# Patient Record
Sex: Female | Born: 1935 | Race: White | Hispanic: No | Smoking: Never smoker
Health system: Southern US, Community
[De-identification: ages and names within clinical notes are randomized; demographics above are authoritative.]

## PROBLEM LIST (undated history)

## (undated) DIAGNOSIS — D47Z9 Other specified neoplasms of uncertain behavior of lymphoid, hematopoietic and related tissue: Secondary | ICD-10-CM

## (undated) DIAGNOSIS — I509 Heart failure, unspecified: Secondary | ICD-10-CM

## (undated) DIAGNOSIS — E785 Hyperlipidemia, unspecified: Secondary | ICD-10-CM

## (undated) DIAGNOSIS — J449 Chronic obstructive pulmonary disease, unspecified: Secondary | ICD-10-CM

## (undated) DIAGNOSIS — I10 Essential (primary) hypertension: Secondary | ICD-10-CM

## (undated) DIAGNOSIS — N3941 Urge incontinence: Secondary | ICD-10-CM

## (undated) HISTORY — PX: REPLACEMENT TOTAL KNEE: SUR1224

## (undated) HISTORY — DX: Heart failure, unspecified: I50.9

## (undated) HISTORY — PX: BREAST CYST EXCISION: SHX579

## (undated) HISTORY — PX: OTHER SURGICAL HISTORY: SHX169

## (undated) HISTORY — DX: Chronic obstructive pulmonary disease, unspecified: J44.9

## (undated) HISTORY — DX: Other specified neoplasms of uncertain behavior of lymphoid, hematopoietic and related tissue: D47.Z9

## (undated) HISTORY — PX: CATARACT EXTRACTION: SUR2

---

## 2003-12-20 ENCOUNTER — Ambulatory Visit: Payer: Self-pay | Admitting: Internal Medicine

## 2004-07-11 ENCOUNTER — Ambulatory Visit: Payer: Self-pay | Admitting: Unknown Physician Specialty

## 2005-06-04 ENCOUNTER — Other Ambulatory Visit: Payer: Self-pay

## 2005-06-16 ENCOUNTER — Inpatient Hospital Stay: Payer: Self-pay | Admitting: General Practice

## 2005-06-20 ENCOUNTER — Encounter: Payer: Self-pay | Admitting: Internal Medicine

## 2005-07-10 ENCOUNTER — Encounter: Payer: Self-pay | Admitting: Internal Medicine

## 2005-09-24 ENCOUNTER — Ambulatory Visit: Payer: Self-pay | Admitting: Unknown Physician Specialty

## 2005-12-28 ENCOUNTER — Other Ambulatory Visit: Payer: Self-pay

## 2005-12-28 ENCOUNTER — Inpatient Hospital Stay: Payer: Self-pay | Admitting: Internal Medicine

## 2006-04-06 ENCOUNTER — Ambulatory Visit: Payer: Self-pay | Admitting: Gastroenterology

## 2006-10-13 ENCOUNTER — Ambulatory Visit: Payer: Self-pay | Admitting: Unknown Physician Specialty

## 2007-04-26 ENCOUNTER — Other Ambulatory Visit: Payer: Self-pay

## 2007-04-26 ENCOUNTER — Emergency Department: Payer: Self-pay | Admitting: Emergency Medicine

## 2007-10-25 ENCOUNTER — Ambulatory Visit: Payer: Self-pay | Admitting: Internal Medicine

## 2008-04-05 ENCOUNTER — Emergency Department: Payer: Self-pay | Admitting: Emergency Medicine

## 2008-12-26 ENCOUNTER — Ambulatory Visit: Payer: Self-pay | Admitting: Internal Medicine

## 2009-01-07 ENCOUNTER — Ambulatory Visit: Payer: Self-pay | Admitting: Internal Medicine

## 2009-01-30 ENCOUNTER — Ambulatory Visit: Payer: Self-pay | Admitting: Surgery

## 2009-02-26 ENCOUNTER — Ambulatory Visit: Payer: Self-pay | Admitting: General Practice

## 2009-03-04 ENCOUNTER — Inpatient Hospital Stay: Payer: Self-pay | Admitting: General Practice

## 2009-03-07 ENCOUNTER — Encounter: Payer: Self-pay | Admitting: Internal Medicine

## 2009-03-12 ENCOUNTER — Encounter: Payer: Self-pay | Admitting: Internal Medicine

## 2009-04-09 ENCOUNTER — Encounter: Payer: Self-pay | Admitting: Internal Medicine

## 2009-05-10 ENCOUNTER — Encounter: Payer: Self-pay | Admitting: Internal Medicine

## 2009-07-25 ENCOUNTER — Emergency Department: Payer: Self-pay | Admitting: Emergency Medicine

## 2010-12-18 ENCOUNTER — Ambulatory Visit: Payer: Self-pay | Admitting: Unknown Physician Specialty

## 2011-09-01 ENCOUNTER — Ambulatory Visit: Payer: Self-pay | Admitting: Obstetrics and Gynecology

## 2011-10-05 ENCOUNTER — Ambulatory Visit: Payer: Self-pay | Admitting: Gastroenterology

## 2011-10-07 LAB — PATHOLOGY REPORT

## 2011-10-09 DIAGNOSIS — N3946 Mixed incontinence: Secondary | ICD-10-CM | POA: Insufficient documentation

## 2012-07-19 ENCOUNTER — Ambulatory Visit: Payer: Self-pay | Admitting: Internal Medicine

## 2013-09-04 ENCOUNTER — Ambulatory Visit: Payer: Self-pay | Admitting: Physician Assistant

## 2013-09-04 DIAGNOSIS — Z6835 Body mass index (BMI) 35.0-35.9, adult: Secondary | ICD-10-CM | POA: Insufficient documentation

## 2013-09-04 DIAGNOSIS — I129 Hypertensive chronic kidney disease with stage 1 through stage 4 chronic kidney disease, or unspecified chronic kidney disease: Secondary | ICD-10-CM | POA: Insufficient documentation

## 2013-12-13 DIAGNOSIS — E7800 Pure hypercholesterolemia, unspecified: Secondary | ICD-10-CM | POA: Insufficient documentation

## 2014-06-19 DIAGNOSIS — Z0000 Encounter for general adult medical examination without abnormal findings: Secondary | ICD-10-CM | POA: Insufficient documentation

## 2015-01-23 ENCOUNTER — Other Ambulatory Visit: Payer: Self-pay | Admitting: Internal Medicine

## 2015-01-23 DIAGNOSIS — R0602 Shortness of breath: Secondary | ICD-10-CM

## 2015-02-01 ENCOUNTER — Ambulatory Visit
Admission: RE | Admit: 2015-02-01 | Discharge: 2015-02-01 | Disposition: A | Discharge status: Home / Self Care | Payer: Medicare Other | Source: Ambulatory Visit | Attending: Internal Medicine | Admitting: Internal Medicine

## 2015-02-01 ENCOUNTER — Ambulatory Visit: Admission: RE | Admit: 2015-02-01 | Payer: Medicare Other | Source: Ambulatory Visit

## 2015-02-01 DIAGNOSIS — R0602 Shortness of breath: Secondary | ICD-10-CM | POA: Insufficient documentation

## 2015-02-01 DIAGNOSIS — K7689 Other specified diseases of liver: Secondary | ICD-10-CM | POA: Diagnosis not present

## 2015-02-01 DIAGNOSIS — R918 Other nonspecific abnormal finding of lung field: Secondary | ICD-10-CM | POA: Diagnosis not present

## 2015-02-01 HISTORY — DX: Essential (primary) hypertension: I10

## 2015-02-01 MED ORDER — IOHEXOL 350 MG/ML SOLN
100.0000 mL | Freq: Once | INTRAVENOUS | Status: AC | PRN
  Administered 2015-02-01: 100 mL via INTRAVENOUS

## 2017-07-07 DIAGNOSIS — D229 Melanocytic nevi, unspecified: Secondary | ICD-10-CM

## 2017-07-07 HISTORY — DX: Melanocytic nevi, unspecified: D22.9

## 2018-04-30 ENCOUNTER — Other Ambulatory Visit: Payer: Self-pay

## 2018-04-30 ENCOUNTER — Encounter: Payer: Self-pay | Admitting: Emergency Medicine

## 2018-04-30 ENCOUNTER — Inpatient Hospital Stay
Admission: EM | Admit: 2018-04-30 | Discharge: 2018-05-06 | DRG: 291 | Disposition: A | Discharge status: Home Health | Payer: Medicare HMO | Attending: Internal Medicine | Admitting: Internal Medicine

## 2018-04-30 ENCOUNTER — Emergency Department: Payer: Medicare HMO

## 2018-04-30 DIAGNOSIS — Z8249 Family history of ischemic heart disease and other diseases of the circulatory system: Secondary | ICD-10-CM | POA: Diagnosis not present

## 2018-04-30 DIAGNOSIS — Z96659 Presence of unspecified artificial knee joint: Secondary | ICD-10-CM | POA: Diagnosis present

## 2018-04-30 DIAGNOSIS — I5033 Acute on chronic diastolic (congestive) heart failure: Secondary | ICD-10-CM | POA: Diagnosis present

## 2018-04-30 DIAGNOSIS — I5021 Acute systolic (congestive) heart failure: Secondary | ICD-10-CM

## 2018-04-30 DIAGNOSIS — Z66 Do not resuscitate: Secondary | ICD-10-CM | POA: Diagnosis present

## 2018-04-30 DIAGNOSIS — I313 Pericardial effusion (noninflammatory): Secondary | ICD-10-CM | POA: Diagnosis present

## 2018-04-30 DIAGNOSIS — Z79899 Other long term (current) drug therapy: Secondary | ICD-10-CM

## 2018-04-30 DIAGNOSIS — R0902 Hypoxemia: Secondary | ICD-10-CM

## 2018-04-30 DIAGNOSIS — I5031 Acute diastolic (congestive) heart failure: Secondary | ICD-10-CM

## 2018-04-30 DIAGNOSIS — I110 Hypertensive heart disease with heart failure: Principal | ICD-10-CM | POA: Diagnosis present

## 2018-04-30 DIAGNOSIS — E785 Hyperlipidemia, unspecified: Secondary | ICD-10-CM | POA: Diagnosis present

## 2018-04-30 DIAGNOSIS — I509 Heart failure, unspecified: Secondary | ICD-10-CM

## 2018-04-30 DIAGNOSIS — T380X5A Adverse effect of glucocorticoids and synthetic analogues, initial encounter: Secondary | ICD-10-CM | POA: Diagnosis present

## 2018-04-30 DIAGNOSIS — N3941 Urge incontinence: Secondary | ICD-10-CM | POA: Diagnosis present

## 2018-04-30 DIAGNOSIS — J209 Acute bronchitis, unspecified: Secondary | ICD-10-CM | POA: Diagnosis present

## 2018-04-30 DIAGNOSIS — J9601 Acute respiratory failure with hypoxia: Secondary | ICD-10-CM | POA: Diagnosis present

## 2018-04-30 DIAGNOSIS — I160 Hypertensive urgency: Secondary | ICD-10-CM | POA: Diagnosis present

## 2018-04-30 DIAGNOSIS — R0602 Shortness of breath: Secondary | ICD-10-CM | POA: Diagnosis present

## 2018-04-30 HISTORY — DX: Hyperlipidemia, unspecified: E78.5

## 2018-04-30 HISTORY — DX: Urge incontinence: N39.41

## 2018-04-30 LAB — CBC WITH DIFFERENTIAL/PLATELET
ABS IMMATURE GRANULOCYTES: 0.07 10*3/uL (ref 0.00–0.07)
Basophils Absolute: 0.1 10*3/uL (ref 0.0–0.1)
Basophils Relative: 0 %
Eosinophils Absolute: 0.1 10*3/uL (ref 0.0–0.5)
Eosinophils Relative: 0 %
HCT: 47.2 % — ABNORMAL HIGH (ref 36.0–46.0)
Hemoglobin: 14.7 g/dL (ref 12.0–15.0)
Immature Granulocytes: 1 %
Lymphocytes Relative: 28 %
Lymphs Abs: 4.2 10*3/uL — ABNORMAL HIGH (ref 0.7–4.0)
MCH: 28.7 pg (ref 26.0–34.0)
MCHC: 31.1 g/dL (ref 30.0–36.0)
MCV: 92.2 fL (ref 80.0–100.0)
Monocytes Absolute: 1 10*3/uL (ref 0.1–1.0)
Monocytes Relative: 7 %
Neutro Abs: 9.5 10*3/uL — ABNORMAL HIGH (ref 1.7–7.7)
Neutrophils Relative %: 64 %
Platelets: 170 10*3/uL (ref 150–400)
RBC: 5.12 MIL/uL — ABNORMAL HIGH (ref 3.87–5.11)
RDW: 13.8 % (ref 11.5–15.5)
Smear Review: ADEQUATE
WBC: 14.9 10*3/uL — ABNORMAL HIGH (ref 4.0–10.5)
nRBC: 0 % (ref 0.0–0.2)

## 2018-04-30 LAB — COMPREHENSIVE METABOLIC PANEL
ALBUMIN: 3.6 g/dL (ref 3.5–5.0)
ALT: 24 U/L (ref 0–44)
AST: 38 U/L (ref 15–41)
Alkaline Phosphatase: 46 U/L (ref 38–126)
Anion gap: 8 (ref 5–15)
BUN: 17 mg/dL (ref 8–23)
CO2: 25 mmol/L (ref 22–32)
Calcium: 8.8 mg/dL — ABNORMAL LOW (ref 8.9–10.3)
Chloride: 103 mmol/L (ref 98–111)
Creatinine, Ser: 0.76 mg/dL (ref 0.44–1.00)
GFR calc Af Amer: >60 mL/min (ref >60)
GFR calc non Af Amer: >60 mL/min (ref >60)
Glucose, Bld: 172 mg/dL — ABNORMAL HIGH (ref 70–99)
Potassium: 4.8 mmol/L (ref 3.5–5.1)
Sodium: 136 mmol/L (ref 135–145)
Total Bilirubin: 0.9 mg/dL (ref 0.3–1.2)
Total Protein: 8 g/dL (ref 6.5–8.1)

## 2018-04-30 LAB — BRAIN NATRIURETIC PEPTIDE: B Natriuretic Peptide: 635 pg/mL — ABNORMAL HIGH (ref 0.0–100.0)

## 2018-04-30 LAB — INFLUENZA PANEL BY PCR (TYPE A & B)
INFLBPCR: NEGATIVE
Influenza A By PCR: NEGATIVE

## 2018-04-30 LAB — TSH: TSH: 1.297 u[IU]/mL (ref 0.350–4.500)

## 2018-04-30 LAB — LACTIC ACID, PLASMA: Lactic Acid, Venous: 1.1 mmol/L (ref 0.5–1.9)

## 2018-04-30 LAB — TROPONIN I: Troponin I: 0.03 ng/mL (ref <0.03)

## 2018-04-30 MED ORDER — GUAIFENESIN-DM 100-10 MG/5ML PO SYRP
5.0000 mL | ORAL_SOLUTION | ORAL | Status: DC | PRN
  Administered 2018-05-01 – 2018-05-05 (×9): 5 mL via ORAL
  Filled 2018-04-30 (×10): qty 5

## 2018-04-30 MED ORDER — IPRATROPIUM-ALBUTEROL 0.5-2.5 (3) MG/3ML IN SOLN
3.0000 mL | Freq: Once | RESPIRATORY_TRACT | Status: AC
  Administered 2018-04-30: 3 mL via RESPIRATORY_TRACT
  Filled 2018-04-30: qty 3

## 2018-04-30 MED ORDER — SODIUM CHLORIDE 0.9% FLUSH: 3.0000 mL | INTRAVENOUS | Status: DC | PRN

## 2018-04-30 MED ORDER — IPRATROPIUM-ALBUTEROL 0.5-2.5 (3) MG/3ML IN SOLN
3.0000 mL | Freq: Four times a day (QID) | RESPIRATORY_TRACT | Status: DC
  Administered 2018-04-30: 3 mL via RESPIRATORY_TRACT
  Filled 2018-04-30: qty 3

## 2018-04-30 MED ORDER — ISOSORBIDE MONONITRATE ER 30 MG PO TB24
30.0000 mg | ORAL_TABLET | Freq: Every day | ORAL | Status: DC
  Administered 2018-04-30 – 2018-05-06 (×7): 30 mg via ORAL
  Filled 2018-04-30 (×7): qty 1

## 2018-04-30 MED ORDER — CARVEDILOL 25 MG PO TABS
25.0000 mg | ORAL_TABLET | Freq: Two times a day (BID) | ORAL | Status: DC
  Administered 2018-04-30 – 2018-05-01 (×2): 25 mg via ORAL
  Filled 2018-04-30 (×2): qty 1

## 2018-04-30 MED ORDER — ENOXAPARIN SODIUM 40 MG/0.4ML ~~LOC~~ SOLN
40.0000 mg | SUBCUTANEOUS | Status: DC
  Administered 2018-04-30 – 2018-05-05 (×6): 40 mg via SUBCUTANEOUS
  Filled 2018-04-30 (×6): qty 0.4

## 2018-04-30 MED ORDER — ACETAMINOPHEN 325 MG PO TABS
650.0000 mg | ORAL_TABLET | ORAL | Status: DC | PRN
  Administered 2018-04-30: 650 mg via ORAL
  Filled 2018-04-30: qty 2

## 2018-04-30 MED ORDER — LISINOPRIL 5 MG PO TABS
5.0000 mg | ORAL_TABLET | Freq: Every day | ORAL | Status: DC
  Administered 2018-04-30 – 2018-05-01 (×2): 5 mg via ORAL
  Filled 2018-04-30 (×2): qty 1

## 2018-04-30 MED ORDER — NITROGLYCERIN 2 % TD OINT
1.0000 [in_us] | TOPICAL_OINTMENT | Freq: Once | TRANSDERMAL | Status: AC
  Administered 2018-04-30: 1 [in_us] via TOPICAL
  Filled 2018-04-30: qty 1

## 2018-04-30 MED ORDER — SODIUM CHLORIDE 0.9 % IV SOLN: 250.0000 mL | INTRAVENOUS | Status: DC | PRN

## 2018-04-30 MED ORDER — SODIUM CHLORIDE 0.9% FLUSH
3.0000 mL | Freq: Two times a day (BID) | INTRAVENOUS | Status: DC
  Administered 2018-04-30 – 2018-05-06 (×11): 3 mL via INTRAVENOUS

## 2018-04-30 MED ORDER — IPRATROPIUM-ALBUTEROL 0.5-2.5 (3) MG/3ML IN SOLN: 3.0000 mL | RESPIRATORY_TRACT | Status: DC

## 2018-04-30 MED ORDER — METHYLPREDNISOLONE SODIUM SUCC 125 MG IJ SOLR
60.0000 mg | Freq: Two times a day (BID) | INTRAMUSCULAR | Status: DC
  Administered 2018-05-01 – 2018-05-02 (×3): 60 mg via INTRAVENOUS
  Filled 2018-04-30 (×3): qty 2

## 2018-04-30 MED ORDER — ASPIRIN EC 81 MG PO TBEC
81.0000 mg | DELAYED_RELEASE_TABLET | Freq: Every day | ORAL | Status: DC
  Filled 2018-04-30 (×7): qty 1

## 2018-04-30 MED ORDER — METHYLPREDNISOLONE SODIUM SUCC 125 MG IJ SOLR: 60.0000 mg | Freq: Four times a day (QID) | INTRAMUSCULAR | Status: DC

## 2018-04-30 MED ORDER — FUROSEMIDE 10 MG/ML IJ SOLN
40.0000 mg | Freq: Once | INTRAMUSCULAR | Status: AC
  Administered 2018-04-30: 40 mg via INTRAVENOUS
  Filled 2018-04-30: qty 4

## 2018-04-30 MED ORDER — ONDANSETRON HCL 4 MG/2ML IJ SOLN: 4.0000 mg | Freq: Four times a day (QID) | INTRAMUSCULAR | Status: DC | PRN

## 2018-04-30 MED ORDER — FUROSEMIDE 10 MG/ML IJ SOLN
40.0000 mg | Freq: Two times a day (BID) | INTRAMUSCULAR | Status: DC
  Administered 2018-04-30 – 2018-05-06 (×12): 40 mg via INTRAVENOUS
  Filled 2018-04-30 (×12): qty 4

## 2018-04-30 MED ORDER — IPRATROPIUM-ALBUTEROL 0.5-2.5 (3) MG/3ML IN SOLN
3.0000 mL | RESPIRATORY_TRACT | Status: DC | PRN
  Administered 2018-05-03: 3 mL via RESPIRATORY_TRACT
  Filled 2018-04-30: qty 3

## 2018-04-30 NOTE — ED Notes (Signed)
Nitro patch removed

## 2018-04-30 NOTE — Plan of Care (Signed)

## 2018-04-30 NOTE — Progress Notes (Signed)
Patient given "Living Better with Heart Failure" packet. Educated on overall packet. Went over importance of daily weights, diuretics, and limit amount of salt intake. Patient will need re-enforcement since this is new diagnosis for him. Patient will need to watch EMMI videos. Will continue to monitor and relay this information to oncoming nursing staff.

## 2018-04-30 NOTE — Progress Notes (Signed)
Advanced care plan.  Purpose of the Encounter: CODE STATUS  Parties in Attendance: Patient herself  Patient's Decision Capacity: Intact  Subjective/Patient's story: Lori Browning  is a 85 y.o. female with a known history of hyperlipidemia, hypertension and urge incontinence who is presenting to the hospital with shortness of breath and cough.  Patient states that she was recently seen by her primary care provider and was treated for bronchitis with steroids and antibiotics.  Patient became very short of breath and hypoxic with oxygen in the 70s.  Initially had to require BiPAP now off BiPAP   Objective/Medical story I discussed with the patient regarding her desires for cardiac and pulmonary resuscitation.  Explained her what this entails.  Also discussed with the patient regarding healthcare power of attorney and living will.   Goals of care determination:  Patient wants to be a DNR, recommended discussing healthcare power of attorney as well as living will with her family   CODE STATUS: DNR   Time spent discussing advanced care planning: 16 minutes

## 2018-04-30 NOTE — ED Notes (Signed)
Called report - no report needed, pt can go up

## 2018-04-30 NOTE — ED Provider Notes (Signed)
[facility] Emergency Department Provider Note       Time seen: ----------------------------------------- 11:59 AM on 04/30/2018 -----------------------------------------   I have reviewed the triage vital signs and the nursing notes.  HISTORY   Chief Complaint Shortness of Breath and Cough   HPI Lori Browning is a 85 y.o. female with a history of hypertension and recent bronchitis who presents to the ED for dyspnea and hypoxia.  According to EMS she was hypoxic in route into the 70s.  Recently diagnosed with bronchitis and possible pneumonia.  She was placed on Levaquin and steroids.  Patient states acutely last night she became worse, she called out for shortness of breath.  Has not had any recent travel or exposure that she is aware of.  Past Medical History:  Diagnosis Date  . Hypertension     There are no active problems to display for this patient.   Allergies Sulfa antibiotics  Social History Social History   Tobacco Use  . Smoking status: Not on file  Substance Use Topics  . Alcohol use: Not on file  . Drug use: Not on file   Review of Systems Constitutional: Negative for fever. Cardiovascular: Negative for chest pain. Respiratory: Positive for shortness of breath Gastrointestinal: Negative for abdominal pain, vomiting and diarrhea. Musculoskeletal: Negative for back pain. Skin: Negative for rash. Neurological: Negative for headaches, focal weakness or numbness.  All systems negative/normal/unremarkable except as stated in the HPI  ____________________________________________   PHYSICAL EXAM:  VITAL SIGNS: ED Triage Vitals  Enc Vitals Group     BP      Pulse      Resp      Temp      Temp src      SpO2      Weight      Height      Head Circumference      Peak Flow      Pain Score      Pain Loc      Pain Edu?      Excl. in [place]?    Constitutional: Alert and oriented.  Mild distress Eyes: Conjunctivae are normal.  Normal extraocular movements. ENT      Head: Normocephalic and atraumatic.      Nose: No congestion/rhinnorhea.      Mouth/Throat: Mucous membranes are moist.      Neck: No stridor. Cardiovascular: Normal rate, regular rhythm. No murmurs, rubs, or gallops. Respiratory: Tachypnea with wheezing and fine rales bilaterally Gastrointestinal: Soft and nontender. Normal bowel sounds Musculoskeletal: Nontender with normal range of motion in extremities.  Mild edema is noted Neurologic:  Normal speech and language. No gross focal neurologic deficits are appreciated.  Skin:  Skin is warm, dry and intact. No rash noted. Psychiatric: Mood and affect are normal. Speech and behavior are normal.  ____________________________________________  EKG: Interpreted by me.  Sinus rhythm with rate 87 bpm, normal PR interval, possible septal infarct, normal axis  ____________________________________________  ED COURSE:  As part of my medical decision making, I reviewed the following data within the [place] History obtained from family if available, nursing notes, old chart and ekg, as well as notes from prior ED visits. Patient presented for shortness of breath, we will assess with labs and imaging as indicated at this time. Clinical Course as of Apr 30 1330  Sat Apr 30, 2018  1227 Patient is much more comfortable on BiPAP at this time.   [JW]  Clinical Course User Index [JW] Earleen Newport, MD   Procedures ____________________________________________   LABS (pertinent positives/negatives)  Labs Reviewed  CBC WITH DIFFERENTIAL/PLATELET - Abnormal; Notable for the following components:      Result Value   WBC 14.9 (*)    RBC 5.12 (*)    HCT 47.2 (*)    Neutro Abs 9.5 (*)    Lymphs Abs 4.2 (*)    All other components within normal limits  BRAIN NATRIURETIC PEPTIDE - Abnormal; Notable for the following components:   B Natriuretic Peptide 635.0 (*)    All other  components within normal limits  COMPREHENSIVE METABOLIC PANEL - Abnormal; Notable for the following components:   Glucose, Bld 172 (*)    Calcium 8.8 (*)    All other components within normal limits  CULTURE, BLOOD (ROUTINE X 2)  CULTURE, BLOOD (ROUTINE X 2)  LACTIC ACID, PLASMA  INFLUENZA PANEL BY PCR (TYPE A & B)   CRITICAL CARE Performed by: Laurence Aly   Total critical care time: 30 minutes  Critical care time was exclusive of separately billable procedures and treating other patients.  Critical care was necessary to treat or prevent imminent or life-threatening deterioration.  Critical care was time spent personally by me on the following activities: development of treatment plan with patient and/or surrogate as well as nursing, discussions with consultants, evaluation of patient's response to treatment, examination of patient, obtaining history from patient or surrogate, ordering and performing treatments and interventions, ordering and review of laboratory studies, ordering and review of radiographic studies, pulse oximetry and re-evaluation of patient's condition.  RADIOLOGY Images were viewed by me  Chest x-ray IMPRESSION: Cardiomegaly and pulmonary vascular congestion. ____________________________________________   DIFFERENTIAL DIAGNOSIS   CHF, COPD, pneumonia, influenza, pneumothorax, PE  FINAL ASSESSMENT AND PLAN  Dyspnea, hypoxia, acute systolic congestive heart failure   Plan: The patient had presented for shortness of breath and hypoxia with recent diagnosis of bronchitis. Patient's labs did reveal some leukocytosis likely secondary to recent prednisone.  BNP is elevated. Patient's imaging was consistent with cardiomegaly and pulmonary vascular congestion.  This appears to be acute systolic congestive heart failure.  She was given nitroglycerin, placed on BiPAP and given Lasix.  She appears much improved.  She is much more comfortable and breathing more  easily.  I will discuss with the hospitalist for admission.  I am hopeful she can come off BiPAP soon.   Laurence Aly, MD    Note: This note was generated in part or whole with voice recognition software. Voice recognition is usually quite accurate but there are transcription errors that can and very often do occur. I apologize for any typographical errors that were not detected and corrected.     Earleen Newport, MD 04/30/18 [phone]

## 2018-04-30 NOTE — ED Triage Notes (Signed)
Pt was dx with bronchitis/ possible pneumonia on Thursday. Placed on levaquin and prednisone. Called out for sob

## 2018-04-30 NOTE — Consult Note (Signed)
Lori Browning is a 85 y.o. female  [id number]  Primary Cardiologist: Neoma Laming Reason for Consultation: Shortness of breath/CHF  HPI: This 85 year old white female patient of Dr. Ouida Sills presented to the hospital hospital after having worsening shortness of breath orthopnea PND and cough.  Patient states that she called Dr. Ouida Sills this Wednesday and was very short of breath and fatigue and sore throat and was recommended to go to another clinic where she had a flu test done which was negative and was treated for possible bronchitis.  Patient continued to get worse thus got admitted to the hospital with shortness of breath cough malaise.   Review of Systems: no chest pain or fever   Past Medical History:  Diagnosis Date  . Hyperlipemia   . Hypertension   . Urge incontinence     Medications Prior to Admission  Medication Sig Dispense Refill  . carvedilol (COREG) 25 MG tablet TAKE ONE TABLET BY MOUTH TWICE DAILY WITH MEALS.    [name] levofloxacin (LEVAQUIN) 500 MG tablet Take 500 mg by mouth daily.    . pravastatin (PRAVACHOL) 40 MG tablet Take 40 mg by mouth daily.    . predniSONE (DELTASONE) 10 MG tablet 6 pills x 1 day, 5 pills x 1 day, 4 pills x 1 day, 3 pills x 1 day, 2 pills x 1 day. 1 pill x 1 day.       [name] aspirin EC  81 mg Oral Daily  . carvedilol  25 mg Oral BID WC  . enoxaparin (LOVENOX) injection  40 mg Subcutaneous Q24H  . furosemide  40 mg Intravenous BID  . ipratropium-albuterol  3 mL Nebulization Q6H  . isosorbide mononitrate  30 mg Oral Daily  . lisinopril  5 mg Oral Daily  . [START ON 05/01/2018] methylPREDNISolone (SOLU-MEDROL) injection  60 mg Intravenous Q12H  . sodium chloride flush  3 mL Intravenous Q12H    Infusions: . sodium chloride      Allergies  Allergen Reactions  . Sulfa Antibiotics Nausea And Vomiting and Rash    Social History   Socioeconomic History  . Marital status: Married    Spouse name: Not on file  . Number of children: Not  on file  . Years of education: Not on file  . Highest education level: Not on file  Occupational History  . Not on file  Social Needs  . Financial resource strain: Not on file  . Food insecurity:    Worry: Not on file    Inability: Not on file  . Transportation needs:    Medical: Not on file    Non-medical: Not on file  Tobacco Use  . Smoking status: Never Smoker  . Smokeless tobacco: Never Used  Substance and Sexual Activity  . Alcohol use: Never    Frequency: Never  . Drug use: Never  . Sexual activity: Not on file  Lifestyle  . Physical activity:    Days per week: Not on file    Minutes per session: Not on file  . Stress: Not on file  Relationships  . Social connections:    Talks on phone: Not on file    Gets together: Not on file    Attends religious service: Not on file    Active member of club or organization: Not on file    Attends meetings of clubs or organizations: Not on file    Relationship status: Not on file  . Intimate partner violence:    Fear  of current or ex partner: Not on file    Emotionally abused: Not on file    Physically abused: Not on file    Forced sexual activity: Not on file  Other Topics Concern  . Not on file  Social History Narrative  . Not on file    Family History  Problem Relation Age of Onset  . Hypertension Mother     PHYSICAL EXAM: Vitals:   04/30/18 1600 04/30/18 1922  BP: (!) 177/81 114/62  Pulse: 69 70  Resp: 19 18  Temp: 97.7 F (36.5 C) 98.5 F (36.9 C)  SpO2: 97% 95%    No intake or output data in the 24 hours ending 04/30/18 1927  General:  Well appearing. No respiratory difficulty HEENT: normal Neck: supple. no JVD. Carotids 2+ bilat; no bruits. No lymphadenopathy or thryomegaly appreciated. Cor: PMI nondisplaced. Regular rate & rhythm. No rubs, gallops or murmurs. Lungs: clear Abdomen: soft, nontender, nondistended. No hepatosplenomegaly. No bruits or masses. Good bowel sounds. Extremities: no cyanosis,  clubbing, rash, edema Neuro: alert & oriented x 3, cranial nerves grossly intact. moves all 4 extremities w/o difficulty. Affect pleasant.  ECG: NSR non specific st change  Results for orders placed or performed during the hospital encounter of 04/30/18 (from the past 24 hour(s))  CBC with Differential     Status: Abnormal   Collection Time: 04/30/18 12:10 PM  Result Value Ref Range   WBC 14.9 (H) 4.0 - 10.5 K/uL   RBC 5.12 (H) 3.87 - 5.11 MIL/uL   Hemoglobin 14.7 12.0 - 15.0 g/dL   HCT 47.2 (H) 36.0 - 46.0 %   MCV 92.2 80.0 - 100.0 fL   MCH 28.7 26.0 - 34.0 pg   MCHC 31.1 30.0 - 36.0 g/dL   RDW 13.8 11.5 - 15.5 %   Platelets 170 150 - 400 K/uL   nRBC 0.0 0.0 - 0.2 %   Neutrophils Relative % 64 %   Neutro Abs 9.5 (H) 1.7 - 7.7 K/uL   Lymphocytes Relative 28 %   Lymphs Abs 4.2 (H) 0.7 - 4.0 K/uL   Monocytes Relative 7 %   Monocytes Absolute 1.0 0.1 - 1.0 K/uL   Eosinophils Relative 0 %   Eosinophils Absolute 0.1 0.0 - 0.5 K/uL   Basophils Relative 0 %   Basophils Absolute 0.1 0.0 - 0.1 K/uL   RBC Morphology MORPHOLOGY UNREMARKABLE    Smear Review PLATELETS APPEAR ADEQUATE    Immature Granulocytes 1 %   Abs Immature Granulocytes 0.07 0.00 - 0.07 K/uL   Reactive, Benign Lymphocytes PRESENT   Brain natriuretic peptide     Status: Abnormal   Collection Time: 04/30/18 12:10 PM  Result Value Ref Range   B Natriuretic Peptide 635.0 (H) 0.0 - 100.0 pg/mL  Comprehensive metabolic panel     Status: Abnormal   Collection Time: 04/30/18 12:10 PM  Result Value Ref Range   Sodium 136 135 - 145 mmol/L   Potassium 4.8 3.5 - 5.1 mmol/L   Chloride 103 98 - 111 mmol/L   CO2 25 22 - 32 mmol/L   Glucose, Bld 172 (H) 70 - 99 mg/dL   BUN 17 8 - 23 mg/dL   Creatinine, Ser 0.76 0.44 - 1.00 mg/dL   Calcium 8.8 (L) 8.9 - 10.3 mg/dL   Total Protein 8.0 6.5 - 8.1 g/dL   Albumin 3.6 3.5 - 5.0 g/dL   AST 38 15 - 41 U/L   ALT 24 0 - 44 U/L  Alkaline Phosphatase 46 38 - 126 U/L   Total Bilirubin  0.9 0.3 - 1.2 mg/dL   GFR calc non Af Amer >60 >60 mL/min   GFR calc Af Amer >60 >60 mL/min   Anion gap 8 5 - 15  Lactic acid, plasma     Status: None   Collection Time: 04/30/18 12:10 PM  Result Value Ref Range   Lactic Acid, Venous 1.1 0.5 - 1.9 mmol/L  Troponin I - Add-On to previous collection     Status: Abnormal   Collection Time: 04/30/18 12:10 PM  Result Value Ref Range   Troponin I 0.03 (HH) <0.03 ng/mL  Influenza panel by PCR (type A & B)     Status: None   Collection Time: 04/30/18 12:11 PM  Result Value Ref Range   Influenza A By PCR NEGATIVE NEGATIVE   Influenza B By PCR NEGATIVE NEGATIVE  TSH     Status: None   Collection Time: 04/30/18  4:19 PM  Result Value Ref Range   TSH 1.297 0.350 - 4.500 uIU/mL   Dg Chest Port 1 View  Result Date: 04/30/2018 CLINICAL DATA:  Hypoxia and dyspnea today. The patient was diagnosed with bronchitis/pneumonia 04/28/2018. EXAM: PORTABLE CHEST 1 VIEW COMPARISON:  CT chest 02/01/2015. FINDINGS: There is cardiomegaly and vascular congestion. No consolidative process, pneumothorax or pleural effusion. No acute or focal bony abnormality. IMPRESSION: Cardiomegaly and pulmonary vascular congestion. Electronically Signed   By: Inge Rise M.D.   On: 04/30/2018 12:27     ASSESSMENT AND PLAN: CHF, with h/o echo in 1/20 showing LVEF 60% with grade 1 diastolic dysfunction, mild MR/TR, and mild luminor irregularities with ca score 99.6, minor luminor irregularitis. Agree with IV lasix/ace-inhibitors. Consider Covid-19 testiing as normally does not get fever.  , A

## 2018-04-30 NOTE — H&P (Signed)
[place] [facility] [place] NAME: Lori Browning    MR#:  [id number]  DATE OF BIRTH:  02/09/1931  DATE OF ADMISSION:  04/30/2018  PRIMARY CARE PHYSICIAN: Kirk Ruths, MD   REQUESTING/REFERRING PHYSICIAN: Earleen Newport, MD  CHIEF COMPLAINT:   Chief Complaint  Patient presents with  . Shortness of Breath  . Cough    HISTORY OF PRESENT ILLNESS: Lori Browning  is a 85 y.o. female with a known history of hyperlipidemia, hypertension and urge incontinence who is presenting to the hospital with shortness of breath and cough.  Patient states that she was recently seen by her primary care provider and was treated for bronchitis with steroids and antibiotics.  Patient became very short of breath and hypoxic with oxygen in the 70s.  Initially had to require BiPAP now off BiPAP.  Patient states that she lives by herself and has not traveled outside of the city.  He has not had any visitors.  He denies any fevers or chills.  His complaint of cough.  States that she was seen by cardiology recently and underwent a stress test, echo, and and likely cardiac CT.      PAST MEDICAL HISTORY:   Past Medical History:  Diagnosis Date  . Hyperlipemia   . Hypertension   . Urge incontinence     PAST SURGICAL HISTORY:  Past Surgical History:  Procedure Laterality Date  . BREAST CYST EXCISION    . CATARACT EXTRACTION    . hip replacment    . REPLACEMENT TOTAL KNEE      SOCIAL HISTORY:  Social History   Tobacco Use  . Smoking status: Never Smoker  . Smokeless tobacco: Never Used  Substance Use Topics  . Alcohol use: Never    Frequency: Never    FAMILY HISTORY:  Family History  Problem Relation Age of Onset  . Hypertension Mother     DRUG ALLERGIES:  Allergies  Allergen Reactions  . Sulfa Antibiotics Nausea And Vomiting and Rash    REVIEW OF SYSTEMS:   CONSTITUTIONAL: No fever, fatigue or weakness.  EYES: No blurred or double vision.   EARS, NOSE, AND THROAT: No tinnitus or ear pain.  RESPIRATORY: No cough, positive shortness of breath, positive wheezing or hemoptysis.  CARDIOVASCULAR: No chest pain, orthopnea, edema.  GASTROINTESTINAL: No nausea, vomiting, diarrhea or abdominal pain.  GENITOURINARY: No dysuria, hematuria.  ENDOCRINE: No polyuria, nocturia,  HEMATOLOGY: No anemia, easy bruising or bleeding SKIN: No rash or lesion. MUSCULOSKELETAL: No joint pain or arthritis.   NEUROLOGIC: No tingling, numbness, weakness.  PSYCHIATRY: No anxiety or depression.   MEDICATIONS AT HOME:  Prior to Admission medications   Not on File      PHYSICAL EXAMINATION:   VITAL SIGNS: Blood pressure 133/71, pulse 69, temperature 97.8 F (36.6 C), resp. rate (!) 27, height 5\' 6"  (1.676 m), weight 109.4 kg, SpO2 95 %.  GENERAL:  85 y.o.-year-old patient lying in the bed with no acute distress.  EYES: Pupils equal, round, reactive to light and accommodation. No scleral icterus. Extraocular muscles intact.  HEENT: Head atraumatic, normocephalic. Oropharynx and nasopharynx clear.  NECK:  Supple, no jugular venous distention. No thyroid enlargement, no tenderness.  LUNGS: Rhonchus breath sounds bilaterally without any accessory muscle usage, crackles at the base CARDIOVASCULAR: S1, S2 normal. No murmurs, rubs, or gallops.  ABDOMEN: Soft, nontender, nondistended. Bowel sounds present. No organomegaly or mass.  EXTREMITIES: No pedal edema, cyanosis, or clubbing.  NEUROLOGIC:  Cranial nerves II through XII are intact. Muscle strength 5/5 in all extremities. Sensation intact. Gait not checked.  PSYCHIATRIC: The patient is alert and oriented x 3.  SKIN: No obvious rash, lesion, or ulcer.   LABORATORY PANEL:   CBC Recent Labs  Lab 04/30/18 1210  WBC 14.9*  HGB 14.7  HCT 47.2*  PLT 170  MCV 92.2  MCH 28.7  MCHC 31.1  RDW 13.8  LYMPHSABS 4.2*  MONOABS 1.0  EOSABS 0.1  BASOSABS 0.1    ------------------------------------------------------------------------------------------------------------------  Chemistries  Recent Labs  Lab 04/30/18 1210  NA 136  K 4.8  CL 103  CO2 25  GLUCOSE 172*  BUN 17  CREATININE 0.76  CALCIUM 8.8*  AST 38  ALT 24  ALKPHOS 46  BILITOT 0.9   ------------------------------------------------------------------------------------------------------------------ estimated creatinine clearance is 64.4 mL/min (by C-G formula based on SCr of 0.76 mg/dL). ------------------------------------------------------------------------------------------------------------------ No results for input(s): TSH, T4TOTAL, T3FREE, THYROIDAB in the last 72 hours.  Invalid input(s): FREET3   Coagulation profile No results for input(s): INR, PROTIME in the last 168 hours. ------------------------------------------------------------------------------------------------------------------- No results for input(s): DDIMER in the last 72 hours. -------------------------------------------------------------------------------------------------------------------  Cardiac Enzymes Recent Labs  Lab 04/30/18 1210  TROPONINI 0.03*   ------------------------------------------------------------------------------------------------------------------ Invalid input(s): POCBNP  ---------------------------------------------------------------------------------------------------------------  Urinalysis No results found for: COLORURINE, APPEARANCEUR, LABSPEC, PHURINE, GLUCOSEU, HGBUR, BILIRUBINUR, KETONESUR, PROTEINUR, UROBILINOGEN, NITRITE, LEUKOCYTESUR   RADIOLOGY: Dg Chest Port 1 View  Result Date: 04/30/2018 CLINICAL DATA:  Hypoxia and dyspnea today. The patient was diagnosed with bronchitis/pneumonia 04/28/2018. EXAM: PORTABLE CHEST 1 VIEW COMPARISON:  CT chest 02/01/2015. FINDINGS: There is cardiomegaly and vascular congestion. No consolidative process, pneumothorax or  pleural effusion. No acute or focal bony abnormality. IMPRESSION: Cardiomegaly and pulmonary vascular congestion. Electronically Signed   By: Inge Rise M.D.   On: 04/30/2018 12:27    EKG: Orders placed or performed during the hospital encounter of 04/30/18  . EKG 12-Lead  . EKG 12-Lead  . ED EKG  . ED EKG    IMPRESSION AND PLAN: Patient is a 85 year old white female with history of hypertension presenting with complaint of shortness of breath  1.  Acute congestive heart failure likely diastolic in nature Treat with IV Lasix I will not repeat echo since it was recently done. Cardiology consult.  2.  Cough with bronchospasm we will treat with steroids and nebulizer therapy  3.  Accelerated hypertension we will continue her Coreg start on ACE inhibitor I will do IV hydralazine.  4.  Hyperlipidemia resume home medications once medication list updated  5.  Miscellaneous Lovenox for DVT prophylaxis     All the records are reviewed and case discussed with ED provider. Management plans discussed with the patient, family and they are in agreement.  CODE STATUS: Advance Directive Documentation     Most Recent Value  Type of Advance Directive  Healthcare Power of Attorney, Living will  Pre-existing out of facility DNR order (yellow form or pink MOST form)  -  "MOST" Form in Place?  -       TOTAL TIME TAKING CARE OF THIS PATIENT:22minutes.    Dustin Flock M.D on 04/30/2018 at 2:39 PM  Between 7am to 6pm - Pager - [phone]  After 6pm go to [email or web address] - password [email or web address]  [employer] [facility] Office  [phone]  CC: Primary care physician; Kirk Ruths, MD

## 2018-04-30 NOTE — ED Notes (Signed)
ED TO INPATIENT HANDOFF REPORT  ED Nurse Name and Phone #: Kimi Kroft [phone]  S Name/Age/Gender Lori Browning 85 y.o. female Room/Bed: ED18A/ED18A  Code Status   Code Status: DNR  Home/SNF/Other Home Patient oriented to: self Is this baseline? Yes   Triage Complete: Triage complete  Chief Complaint bronchitis  Triage Note Pt was dx with bronchitis/ possible pneumonia on Thursday. Placed on levaquin and prednisone. Called out for sob   Allergies Allergies  Allergen Reactions  . Sulfa Antibiotics Nausea And Vomiting and Rash    Level of Care/Admitting Diagnosis ED Disposition    ED Disposition Condition [facility] Area: [place] [[id number]]  Level of Care: Telemetry [5]  Diagnosis: Acute CHF (congestive heart failure) [facility]) [[id number]]  Admitting Physician: Dustin Flock [[id number]]  Attending Physician: Dustin Flock [[id number]]  Estimated length of stay: past midnight tomorrow  Certification:: I certify this patient will need inpatient services for at least 2 midnights  PT Class (Do Not Modify): Inpatient [101]  PT Acc Code (Do Not Modify): Private [1]       B Medical/Surgery History Past Medical History:  Diagnosis Date  . Hyperlipemia   . Hypertension   . Urge incontinence    Past Surgical History:  Procedure Laterality Date  . BREAST CYST EXCISION    . CATARACT EXTRACTION    . hip replacment    . REPLACEMENT TOTAL KNEE       A IV Location/Drains/Wounds Patient Lines/Drains/Airways Status   Active Line/Drains/Airways    Name:   Placement date:   Placement time:   Site:   Days:   Peripheral IV 04/30/18 Left Hand   04/30/18    1209    Hand   less than 1          Intake/Output Last 24 hours No intake or output data in the 24 hours ending 04/30/18 1517  Labs/Imaging Results for orders placed or performed during the hospital encounter of 04/30/18 (from the past 48 hour(s))  CBC with Differential     Status: Abnormal    Collection Time: 04/30/18 12:10 PM  Result Value Ref Range   WBC 14.9 (H) 4.0 - 10.5 K/uL   RBC 5.12 (H) 3.87 - 5.11 MIL/uL   Hemoglobin 14.7 12.0 - 15.0 g/dL   HCT 47.2 (H) 36.0 - 46.0 %   MCV 92.2 80.0 - 100.0 fL   MCH 28.7 26.0 - 34.0 pg   MCHC 31.1 30.0 - 36.0 g/dL   RDW 13.8 11.5 - 15.5 %   Platelets 170 150 - 400 K/uL   nRBC 0.0 0.0 - 0.2 %   Neutrophils Relative % 64 %   Neutro Abs 9.5 (H) 1.7 - 7.7 K/uL   Lymphocytes Relative 28 %   Lymphs Abs 4.2 (H) 0.7 - 4.0 K/uL   Monocytes Relative 7 %   Monocytes Absolute 1.0 0.1 - 1.0 K/uL   Eosinophils Relative 0 %   Eosinophils Absolute 0.1 0.0 - 0.5 K/uL   Basophils Relative 0 %   Basophils Absolute 0.1 0.0 - 0.1 K/uL   RBC Morphology MORPHOLOGY UNREMARKABLE    Smear Review PLATELETS APPEAR ADEQUATE    Immature Granulocytes 1 %   Abs Immature Granulocytes 0.07 0.00 - 0.07 K/uL   Reactive, Benign Lymphocytes PRESENT     Comment: Performed at [facility], [address] [place], [place] [address]  Brain natriuretic peptide     Status: Abnormal   Collection Time: 04/30/18  12:10 PM  Result Value Ref Range   B Natriuretic Peptide 635.0 (H) 0.0 - 100.0 pg/mL    Comment: Performed at [facility], [address] [place], [place] [address]  Comprehensive metabolic panel     Status: Abnormal   Collection Time: 04/30/18 12:10 PM  Result Value Ref Range   Sodium 136 135 - 145 mmol/L   Potassium 4.8 3.5 - 5.1 mmol/L    Comment: HEMOLYSIS AT THIS LEVEL MAY AFFECT RESULT   Chloride 103 98 - 111 mmol/L   CO2 25 22 - 32 mmol/L   Glucose, Bld 172 (H) 70 - 99 mg/dL   BUN 17 8 - 23 mg/dL   Creatinine, Ser 0.76 0.44 - 1.00 mg/dL   Calcium 8.8 (L) 8.9 - 10.3 mg/dL   Total Protein 8.0 6.5 - 8.1 g/dL   Albumin 3.6 3.5 - 5.0 g/dL   AST 38 15 - 41 U/L   ALT 24 0 - 44 U/L   Alkaline Phosphatase 46 38 - 126 U/L   Total Bilirubin 0.9 0.3 - 1.2 mg/dL   GFR calc non Af Amer >60 >60 mL/min   GFR calc Af Amer >60 >60  mL/min   Anion gap 8 5 - 15    Comment: Performed at [facility], [address] [place], [place] [address]  Lactic acid, plasma     Status: None   Collection Time: 04/30/18 12:10 PM  Result Value Ref Range   Lactic Acid, Venous 1.1 0.5 - 1.9 mmol/L    Comment: Performed at [facility], [address] [place], [place] [address]  Troponin I - Add-On to previous collection     Status: Abnormal   Collection Time: 04/30/18 12:10 PM  Result Value Ref Range   Troponin I 0.03 (HH) <0.03 ng/mL    Comment: CRITICAL RESULT CALLED TO, READ BACK BY AND VERIFIED WITH Shadoe Cryan @1406  04/30/18 AKT Performed at [facility], [address], [place], [place] [address]   Influenza panel by PCR (type A & B)     Status: None   Collection Time: 04/30/18 12:11 PM  Result Value Ref Range   Influenza A By PCR NEGATIVE NEGATIVE   Influenza B By PCR NEGATIVE NEGATIVE    Comment: (NOTE) The Xpert Xpress Flu assay is intended as an aid in the diagnosis of  influenza and should not be used as a sole basis for treatment.  This  assay is FDA approved for nasopharyngeal swab specimens only. Nasal  washings and aspirates are unacceptable for Xpert Xpress Flu testing. Performed at [facility], [address], [place], [place] [address]    Dg Chest Port 1 View  Result Date: 04/30/2018 CLINICAL DATA:  Hypoxia and dyspnea today. The patient was diagnosed with bronchitis/pneumonia 04/28/2018. EXAM: PORTABLE CHEST 1 VIEW COMPARISON:  CT chest 02/01/2015. FINDINGS: There is cardiomegaly and vascular congestion. No consolidative process, pneumothorax or pleural effusion. No acute or focal bony abnormality. IMPRESSION: Cardiomegaly and pulmonary vascular congestion. Electronically Signed   By: Inge Rise M.D.   On: 04/30/2018 12:27    Pending Labs Unresulted Labs (From admission, onward)    Start     Ordered   04/30/18 1205  Blood culture (routine x 2)  BLOOD CULTURE X  2,   STAT     04/30/18 1205   Signed and Held  Basic metabolic panel  Daily,   R     Signed and Held   Signed and  Held  CBC  (enoxaparin (LOVENOX)    CrCl >/= 30 ml/min)  Once,   R    Comments:  Baseline for enoxaparin therapy IF NOT ALREADY DRAWN.  Notify MD if PLT < 100 K.    Signed and Held   Signed and Held  Creatinine, serum  (enoxaparin (LOVENOX)    CrCl >/= 30 ml/min)  Once,   R    Comments:  Baseline for enoxaparin therapy IF NOT ALREADY DRAWN.    Signed and Held   Signed and Held  Creatinine, serum  (enoxaparin (LOVENOX)    CrCl >/= 30 ml/min)  Weekly,   R    Comments:  while on enoxaparin therapy    Signed and Held   Signed and Held  TSH  Once,   R     Signed and Held          Vitals/Pain Today's Vitals   04/30/18 1400 04/30/18 1408 04/30/18 1430 04/30/18 1500  BP: (!) 150/74  (!) 191/84 (!) 164/133  Pulse: 69  71 71  Resp: (!) 31  20 (!) 25  Temp:      SpO2: 97% 95% 96% 94%  Weight:      Height:      PainSc:        Isolation Precautions Droplet precaution  Medications Medications  guaiFENesin-dextromethorphan (ROBITUSSIN DM) 100-10 MG/5ML syrup 5 mL (has no administration in time range)  methylPREDNISolone sodium succinate (SOLU-MEDROL) 125 mg/2 mL injection 60 mg (has no administration in time range)  ipratropium-albuterol (DUONEB) 0.5-2.5 (3) MG/3ML nebulizer solution 3 mL (has no administration in time range)  nitroGLYCERIN (NITROGLYN) 2 % ointment 1 inch (1 inch Topical Given 04/30/18 1219)  ipratropium-albuterol (DUONEB) 0.5-2.5 (3) MG/3ML nebulizer solution 3 mL (3 mLs Nebulization Given 04/30/18 1226)  ipratropium-albuterol (DUONEB) 0.5-2.5 (3) MG/3ML nebulizer solution 3 mL (3 mLs Nebulization Given 04/30/18 1226)  furosemide (LASIX) injection 40 mg (40 mg Intravenous Given 04/30/18 1310)    Mobility walks Low fall risk   Focused Assessments Pulmonary Assessment Handoff:  Lung sounds: L Breath Sounds: Rhonchi R Breath Sounds: Rhonchi O2  Device: Nasal Cannula O2 Flow Rate (L/min): 3 L/min      R Recommendations: See Admitting Provider Note  Report given to:   Additional Notes:

## 2018-05-01 LAB — BASIC METABOLIC PANEL
Anion gap: 9 (ref 5–15)
BUN: 24 mg/dL — AB (ref 8–23)
CO2: 26 mmol/L (ref 22–32)
Calcium: 8.9 mg/dL (ref 8.9–10.3)
Chloride: 103 mmol/L (ref 98–111)
Creatinine, Ser: 0.98 mg/dL (ref 0.44–1.00)
GFR calc Af Amer: >60 mL/min (ref >60)
GFR, EST NON AFRICAN AMERICAN: 53 mL/min — AB (ref >60)
Glucose, Bld: 120 mg/dL — ABNORMAL HIGH (ref 70–99)
Potassium: 3.5 mmol/L (ref 3.5–5.1)
Sodium: 138 mmol/L (ref 135–145)

## 2018-05-01 MED ORDER — LISINOPRIL 10 MG PO TABS
10.0000 mg | ORAL_TABLET | Freq: Every day | ORAL | Status: DC
  Administered 2018-05-02 – 2018-05-06 (×5): 10 mg via ORAL
  Filled 2018-05-01 (×5): qty 1

## 2018-05-01 MED ORDER — HYDRALAZINE HCL 25 MG PO TABS
25.0000 mg | ORAL_TABLET | Freq: Three times a day (TID) | ORAL | Status: DC
  Administered 2018-05-01 – 2018-05-02 (×3): 25 mg via ORAL
  Filled 2018-05-01 (×3): qty 1

## 2018-05-01 MED ORDER — PRAVASTATIN SODIUM 40 MG PO TABS
40.0000 mg | ORAL_TABLET | Freq: Every day | ORAL | Status: DC
  Administered 2018-05-01 – 2018-05-06 (×6): 40 mg via ORAL
  Filled 2018-05-01 (×6): qty 1

## 2018-05-01 MED ORDER — ALBUTEROL SULFATE (2.5 MG/3ML) 0.083% IN NEBU
2.5000 mg | INHALATION_SOLUTION | RESPIRATORY_TRACT | Status: DC | PRN
  Administered 2018-05-01 – 2018-05-02 (×5): 2.5 mg via RESPIRATORY_TRACT
  Filled 2018-05-01 (×6): qty 3

## 2018-05-01 MED ORDER — CARVEDILOL 12.5 MG PO TABS
12.5000 mg | ORAL_TABLET | Freq: Two times a day (BID) | ORAL | Status: DC
  Administered 2018-05-01 – 2018-05-02 (×2): 12.5 mg via ORAL
  Filled 2018-05-01 (×2): qty 1

## 2018-05-01 MED ORDER — SPIRONOLACTONE 25 MG PO TABS
25.0000 mg | ORAL_TABLET | Freq: Every day | ORAL | Status: DC
  Administered 2018-05-01 – 2018-05-06 (×6): 25 mg via ORAL
  Filled 2018-05-01 (×6): qty 1

## 2018-05-01 MED ORDER — LEVOFLOXACIN 500 MG PO TABS
500.0000 mg | ORAL_TABLET | Freq: Every day | ORAL | Status: AC
  Administered 2018-05-01 – 2018-05-02 (×2): 500 mg via ORAL
  Filled 2018-05-01 (×2): qty 1

## 2018-05-01 NOTE — Progress Notes (Signed)
[place] at [place] NAME: Lori Browning    MR#:  [id number]  DATE OF BIRTH:  [birth year]  SUBJECTIVE:  CHIEF COMPLAINT:   Chief Complaint  Patient presents with  . Shortness of Breath  . Cough   No new complaints.  Still had some mild shortness of breath and wheezing.  No fevers.  Denies any sick contacts.  No recent travel.   REVIEW OF SYSTEMS:  Review of Systems  Constitutional: Negative for chills and fever.  HENT: Negative for hearing loss and tinnitus.   Eyes: Negative for blurred vision and double vision.  Respiratory: Positive for shortness of breath and wheezing.   Cardiovascular: Negative for chest pain, palpitations and orthopnea.  Gastrointestinal: Negative for heartburn, nausea and vomiting.  Genitourinary: Negative for dysuria and urgency.  Musculoskeletal: Negative for myalgias and neck pain.  Skin: Negative for itching and rash.  Neurological: Negative for dizziness and headaches.  Psychiatric/Behavioral: Negative for depression and hallucinations.    DRUG ALLERGIES:   Allergies  Allergen Reactions  . Sulfa Antibiotics Nausea And Vomiting and Rash   VITALS:  Blood pressure (!) 171/95, pulse 77, temperature 98.6 F (37 C), temperature source Oral, resp. rate 20, height 5\' 6"  (1.676 m), weight 106.6 kg, SpO2 92 %. PHYSICAL EXAMINATION:  Physical Exam  Constitutional: She is oriented to person, place, and time. She appears well-developed and well-nourished.  HENT:  Head: Normocephalic.  Right Ear: External ear normal.  Left Ear: External ear normal.  Eyes: Pupils are equal, round, and reactive to light. Conjunctivae are normal. Right eye exhibits no discharge.  Neck: Normal range of motion. Neck supple. No thyromegaly present.  Cardiovascular: Normal rate, regular rhythm and normal heart sounds.  Respiratory: Effort normal. She has wheezes. She has rales.  GI: Soft. Bowel sounds are normal. There is no  abdominal tenderness.  Musculoskeletal: Normal range of motion.        General: Edema present.  Neurological: She is alert and oriented to person, place, and time.  Skin: Skin is warm and dry. She is not diaphoretic.  Psychiatric: She has a normal mood and affect. Her behavior is normal.   LABORATORY PANEL:  Female CBC Recent Labs  Lab 04/30/18 1210  WBC 14.9*  HGB 14.7  HCT 47.2*  PLT 170   ------------------------------------------------------------------------------------------------------------------ Chemistries  Recent Labs  Lab 04/30/18 1210 05/01/18 0518  NA 136 138  K 4.8 3.5  CL 103 103  CO2 25 26  GLUCOSE 172* 120*  BUN 17 24*  CREATININE 0.76 0.98  CALCIUM 8.8* 8.9  AST 38  --   ALT 24  --   ALKPHOS 46  --   BILITOT 0.9  --    RADIOLOGY:  No results found. ASSESSMENT AND PLAN:    Patient is a 85 year old white female with history of hypertension presenting with complaint of shortness of breath  1.  Acute diastolic congestive heart failure exacerbation  Being diuresed with IV Lasix.   2D echocardiogram from 01/20 documented in cardiologist note with ejection fraction of 60% with grade 1 diastolic dysfunction.  Appreciate cardiologist input.  Cardiologist added hydralazine 25 mg p.o. every 8 hourly.  Lisinopril increased to 10 mg daily.   Monitor daily weights.    2.   Acute bronchitis with cough with bronchospasm Patient treated for bronchitis recently.  Placed on IV steroids on admission and nebulizer treatments.  Patient noted to have been on levofloxacin prior to admission.  Continue  the same.  Monitor clinically.  Influenza test negative. No sick contacts.  No recent travel history.    3.  Hypertensive urgency. Dose of lisinopril increased.  Hydralazine also added by cardiologist. Continue Coreg.  Patient on PRN IV hydralazine.  Monitor blood pressure and adjust regimen as needed  4.  Hyperlipidemia Continue statins   DVT prophylaxis;  Lovenox   All the records are reviewed and case discussed with Care Management/Social Worker. Management plans discussed with the patient, family and they are in agreement.  CODE STATUS: DNR  TOTAL TIME TAKING CARE OF THIS PATIENT: 38 minutes.   More than 50% of the time was spent in counseling/coordination of care: YES  POSSIBLE D/C IN 2 DAYS, DEPENDING ON CLINICAL CONDITION.   Lori Browning M.D on 05/01/2018 at 1:47 PM  Between 7am to 6pm - Pager - [phone]  After 6pm go to [email or web address] - Technical brewer [facility]  Office  [phone]  CC: Primary care physician; Kirk Ruths, MD  Note: This dictation was prepared with Dragon dictation along with smaller phrase technology. Any transcriptional errors that result from this process are unintentional.

## 2018-05-01 NOTE — Progress Notes (Signed)
SUBJECTIVE: still very SOB, and whezzing   Vitals:   04/30/18 1922 04/30/18 2024 05/01/18 0419 05/01/18 0804  BP: 114/62  (!) 168/88 (!) 171/95  Pulse: 70  73 77  Resp: 18  18 20   Temp: 98.5 F (36.9 C)  98.1 F (36.7 C) 98.6 F (37 C)  TempSrc: Oral  Oral Oral  SpO2: 95% 95% 92% 92%  Weight:   106.6 kg   Height:        Intake/Output Summary (Last 24 hours) at 05/01/2018 1114 Last data filed at 05/01/2018 1018 Gross per 24 hour  Intake -  Output 1500 ml  Net -1500 ml    LABS: Basic Metabolic Panel: Recent Labs    04/30/18 1210 05/01/18 0518  NA 136 138  K 4.8 3.5  CL 103 103  CO2 25 26  GLUCOSE 172* 120*  BUN 17 24*  CREATININE 0.76 0.98  CALCIUM 8.8* 8.9   Liver Function Tests: Recent Labs    04/30/18 1210  AST 38  ALT 24  ALKPHOS 46  BILITOT 0.9  PROT 8.0  ALBUMIN 3.6   No results for input(s): LIPASE, AMYLASE in the last 72 hours. CBC: Recent Labs    04/30/18 1210  WBC 14.9*  NEUTROABS 9.5*  HGB 14.7  HCT 47.2*  MCV 92.2  PLT 170   Cardiac Enzymes: Recent Labs    04/30/18 1210  TROPONINI 0.03*   BNP: Invalid input(s): POCBNP D-Dimer: No results for input(s): DDIMER in the last 72 hours. Hemoglobin A1C: No results for input(s): HGBA1C in the last 72 hours. Fasting Lipid Panel: No results for input(s): CHOL, HDL, LDLCALC, TRIG, CHOLHDL, LDLDIRECT in the last 72 hours. Thyroid Function Tests: Recent Labs    04/30/18 1619  TSH 1.297   Anemia Panel: No results for input(s): VITAMINB12, FOLATE, FERRITIN, TIBC, IRON, RETICCTPCT in the last 72 hours.   PHYSICAL EXAM General: Well developed, well nourished, in no acute distress HEENT:  Normocephalic and atramatic Neck:  No JVD.  Lungs: Clear bilaterally to auscultation and percussion. Heart: HRRR . Normal S1 and S2 without gallops or murmurs.  Abdomen: Bowel sounds are positive, abdomen soft and non-tender  Msk:  Back normal, normal gait. Normal strength and tone for  age. Extremities: No clubbing, cyanosis or edema.   Neuro: Alert and oriented X 3. Psych:  Good affect, responds appropriately  TELEMETRY: NSr  ASSESSMENT AND PLAN: CHF due to diastolic dysfunction, may be hypertensive  Heart disease as had mild luminor irregu8larities on CTA coronaries with LVEF 60% and grade 1 diastolic dysfunction. Will add hydralazine 25 q 8, increase lisnopril to 10 mg daily as BP is high. Patient is wheezing andmay be due to high doage of coreg, decrease dosage advised and control BP with lisinopril/hydralazine as LV systolic function normal.  Active Problems:   Acute CHF (congestive heart failure) (HCC)    Norman Bier A, MD, [place] 05/01/2018 11:14 AM

## 2018-05-02 LAB — BASIC METABOLIC PANEL WITH GFR
Anion gap: 8 (ref 5–15)
BUN: 33 mg/dL — ABNORMAL HIGH (ref 8–23)
CO2: 30 mmol/L (ref 22–32)
Calcium: 8.8 mg/dL — ABNORMAL LOW (ref 8.9–10.3)
Chloride: 100 mmol/L (ref 98–111)
Creatinine, Ser: 0.83 mg/dL (ref 0.44–1.00)
GFR calc Af Amer: >60 mL/min
GFR calc non Af Amer: >60 mL/min
Glucose, Bld: 167 mg/dL — ABNORMAL HIGH (ref 70–99)
Potassium: 3.5 mmol/L (ref 3.5–5.1)
Sodium: 138 mmol/L (ref 135–145)

## 2018-05-02 LAB — MAGNESIUM: Magnesium: 2.3 mg/dL (ref 1.7–2.4)

## 2018-05-02 LAB — PHOSPHORUS: Phosphorus: 4.3 mg/dL (ref 2.5–4.6)

## 2018-05-02 MED ORDER — MIDAZOLAM HCL 5 MG/5ML IJ SOLN
INTRAMUSCULAR | Status: AC
  Filled 2018-05-02: qty 5

## 2018-05-02 MED ORDER — CARVEDILOL 3.125 MG PO TABS
3.1250 mg | ORAL_TABLET | Freq: Two times a day (BID) | ORAL | Status: DC
  Administered 2018-05-02 – 2018-05-06 (×8): 3.125 mg via ORAL
  Filled 2018-05-02 (×8): qty 1

## 2018-05-02 MED ORDER — HYDRALAZINE HCL 50 MG PO TABS
50.0000 mg | ORAL_TABLET | Freq: Three times a day (TID) | ORAL | Status: DC
  Administered 2018-05-02 – 2018-05-06 (×13): 50 mg via ORAL
  Filled 2018-05-02 (×14): qty 1

## 2018-05-02 MED ORDER — FENTANYL CITRATE (PF) 100 MCG/2ML IJ SOLN
INTRAMUSCULAR | Status: AC
  Filled 2018-05-02: qty 2

## 2018-05-02 MED ORDER — METHYLPREDNISOLONE SODIUM SUCC 40 MG IJ SOLR
40.0000 mg | Freq: Two times a day (BID) | INTRAMUSCULAR | Status: DC
  Administered 2018-05-02 – 2018-05-06 (×8): 40 mg via INTRAVENOUS
  Filled 2018-05-02 (×8): qty 1

## 2018-05-02 NOTE — Progress Notes (Signed)
SUBJECTIVE: Pt reports feeling better than yesterday, although still short of breath. Denies chest pain.   Vitals:   05/01/18 1658 05/01/18 1929 05/02/18 0441 05/02/18 0736  BP: (!) 162/85 136/75 (!) 159/91 (!) 149/76  Pulse: 71 75 63 62  Resp: 18 16 17 20   Temp:  97.8 F (36.6 C) (!) 97.4 F (36.3 C) 97.8 F (36.6 C)  TempSrc:  Oral Oral Oral  SpO2: 95% 94% 95% 94%  Weight:   105.6 kg   Height:        Intake/Output Summary (Last 24 hours) at 05/02/2018 0945 Last data filed at 05/02/2018 7893 Gross per 24 hour  Intake 240 ml  Output 1450 ml  Net -1210 ml    LABS: Basic Metabolic Panel: Recent Labs    05/01/18 0518 05/02/18 0417  NA 138 138  K 3.5 3.5  CL 103 100  CO2 26 30  GLUCOSE 120* 167*  BUN 24* 33*  CREATININE 0.98 0.83  CALCIUM 8.9 8.8*  MG  --  2.3  PHOS  --  4.3   Liver Function Tests: Recent Labs    04/30/18 1210  AST 38  ALT 24  ALKPHOS 46  BILITOT 0.9  PROT 8.0  ALBUMIN 3.6   No results for input(s): LIPASE, AMYLASE in the last 72 hours. CBC: Recent Labs    04/30/18 1210  WBC 14.9*  NEUTROABS 9.5*  HGB 14.7  HCT 47.2*  MCV 92.2  PLT 170   Cardiac Enzymes: Recent Labs    04/30/18 1210  TROPONINI 0.03*   BNP: Invalid input(s): POCBNP D-Dimer: No results for input(s): DDIMER in the last 72 hours. Hemoglobin A1C: No results for input(s): HGBA1C in the last 72 hours. Fasting Lipid Panel: No results for input(s): CHOL, HDL, LDLCALC, TRIG, CHOLHDL, LDLDIRECT in the last 72 hours. Thyroid Function Tests: Recent Labs    04/30/18 1619  TSH 1.297   Anemia Panel: No results for input(s): VITAMINB12, FOLATE, FERRITIN, TIBC, IRON, RETICCTPCT in the last 72 hours.   PHYSICAL EXAM General: Pale, weak,  Resting comfortably, in no acute distress HEENT:  Normocephalic and atramatic Neck:  No JVD.  Lungs: Wheezing bilaterally. Heart: HRRR . Normal S1 and S2 without gallops or murmurs.  Abdomen: Bowel sounds are positive, abdomen  soft and non-tender  Msk:  Back normal, normal gait. Normal strength and tone for age. Extremities: No clubbing, cyanosis or edema.   Neuro: Alert and oriented X 3. Psych:  Good affect, responds appropriately  TELEMETRY: NSR 64bpm  ASSESSMENT AND PLAN:  CHF: Continue IV lasix, wean down carvedilol to 3.125mg  BID, increase hydralazine to 50mg  TID. Wean oxygen as tolerated.   Active Problems:   Acute CHF (congestive heart failure) ([place])    Lori Bathe, NP-C 05/02/2018 9:45 AM Cell: [phone]

## 2018-05-02 NOTE — Progress Notes (Signed)
[place] at [place] NAME: Lori Browning    MR#:  [id number]  DATE OF BIRTH:  09-18-1930  SUBJECTIVE:  CHIEF COMPLAINT:   Chief Complaint  Patient presents with  . Shortness of Breath  . Cough   No new complaints.  Still had some mild shortness of breath and wheezing.  No fevers.  Denies any sick contacts.  No recent travel.   REVIEW OF SYSTEMS:  Review of Systems  Constitutional: Negative for chills and fever.  HENT: Negative for hearing loss and tinnitus.   Eyes: Negative for blurred vision and double vision.  Respiratory: Negative for wheezing.        Shortness of breath improved.  Cardiovascular: Negative for chest pain, palpitations and orthopnea.  Gastrointestinal: Negative for heartburn, nausea and vomiting.  Genitourinary: Negative for dysuria and urgency.  Musculoskeletal: Negative for myalgias and neck pain.  Skin: Negative for itching and rash.  Neurological: Negative for dizziness and headaches.  Psychiatric/Behavioral: Negative for depression and hallucinations.    DRUG ALLERGIES:   Allergies  Allergen Reactions  . Sulfa Antibiotics Nausea And Vomiting and Rash   VITALS:  Blood pressure (!) 149/76, pulse 62, temperature 97.8 F (36.6 C), temperature source Oral, resp. rate 20, height 5\' 6"  (1.676 m), weight 105.6 kg, SpO2 94 %. PHYSICAL EXAMINATION:  Physical Exam  Constitutional: She is oriented to person, place, and time. She appears well-developed and well-nourished.  HENT:  Head: Normocephalic.  Right Ear: External ear normal.  Left Ear: External ear normal.  Eyes: Pupils are equal, round, and reactive to light. Conjunctivae are normal. Right eye exhibits no discharge.  Neck: Normal range of motion. Neck supple. No thyromegaly present.  Cardiovascular: Normal rate, regular rhythm and normal heart sounds.  Respiratory: Effort normal. She has no wheezes. She has rales.  GI: Soft. Bowel sounds are normal.  There is no abdominal tenderness.  Musculoskeletal: Normal range of motion.        General: Edema present.  Neurological: She is alert and oriented to person, place, and time.  Skin: Skin is warm and dry. She is not diaphoretic.  Psychiatric: She has a normal mood and affect. Her behavior is normal.   LABORATORY PANEL:  Female CBC Recent Labs  Lab 04/30/18 1210  WBC 14.9*  HGB 14.7  HCT 47.2*  PLT 170   ------------------------------------------------------------------------------------------------------------------ Chemistries  Recent Labs  Lab 04/30/18 1210  05/02/18 0417  NA 136   < > 138  K 4.8   < > 3.5  CL 103   < > 100  CO2 25   < > 30  GLUCOSE 172*   < > 167*  BUN 17   < > 33*  CREATININE 0.76   < > 0.83  CALCIUM 8.8*   < > 8.8*  MG  --   --  2.3  AST 38  --   --   ALT 24  --   --   ALKPHOS 46  --   --   BILITOT 0.9  --   --    < > = values in this interval not displayed.   RADIOLOGY:  No results found. ASSESSMENT AND PLAN:    Patient is a 85 year old white female with history of hypertension presenting with complaint of shortness of breath  1.  Acute diastolic congestive heart failure exacerbation  Being diuresed with IV Lasix.   2D echocardiogram from 01/20 documented in cardiologist note with ejection fraction of  60% with grade 1 diastolic dysfunction.  Appreciate cardiologist input.  Cardiologist added hydralazine 25 mg p.o. every 8 hourly.  Lisinopril increased to 10 mg daily.   Monitor daily weights.    2.   Acute bronchitis with cough with bronchospasm  Patient treated for bronchitis recently.  Placed on IV steroids on admission and nebulizer treatments.  Patient noted to have been on levofloxacin prior to admission.  To complete 5-day treatment duration today.  Monitor clinically.  Initiated further tapering of IV steroids Influenza test negative. No sick contacts.  No recent travel history.   Patient currently requiring oxygen via nasal cannula.   Nursing staff to wean off oxygen.  Patient does not use oxygen at home   3.  Hypertensive urgency. Dose of lisinopril increased.  Hydralazine also added by cardiologist yesterday.  Blood pressure better controlled Continue Coreg.  Patient on PRN IV hydralazine.  Monitor blood pressure and adjust regimen as needed  4.  Hyperlipidemia Continue statins   DVT prophylaxis; Lovenox   All the records are reviewed and case discussed with Care Management/Social Worker. Management plans discussed with the patient, family and they are in agreement.  CODE STATUS: DNR  TOTAL TIME TAKING CARE OF THIS PATIENT: 36 minutes.   More than 50% of the time was spent in counseling/coordination of care: YES  POSSIBLE D/C IN 2 DAYS, DEPENDING ON CLINICAL CONDITION.   Lori Browning M.D on 05/02/2018 at 1:37 PM  Between 7am to 6pm - Pager - [phone]  After 6pm go to [email or web address] - Technical brewer [facility]  Office  [phone]  CC: Primary care physician; Kirk Ruths, MD  Note: This dictation was prepared with Dragon dictation along with smaller phrase technology. Any transcriptional errors that result from this process are unintentional.

## 2018-05-02 NOTE — Progress Notes (Signed)
Attempted to wean pt off O2. O2 sats were 85-86% on air room. O2 sats 90-93% on 1L and >94% on 2L.

## 2018-05-02 NOTE — TOC Initial Note (Signed)
Transition of Care [facility]) - Initial/Assessment Note    Patient Details  Name: Lori Browning MRN: [id number] Date of Birth: [birth year]  Transition of Care [facility]) CM/SW Contact:    Elza Rafter, RN Phone Number: 05/02/2018, 2:20 PM  Clinical Narrative:     Patient is independent from home.  New diagnoses of CHF.  Currently on 1L oxygen; down from 3L.  Has heart failure booklet at the bedside.  Has a functioning scale at home.  HF clinic appointment scheduled.  Wears a life alert on her wrist.  Denies difficulties obtaining medications or with accessing healthcare.  Current with her PCP.  No needs identified at this time.  Her nephew will transport her to home when discharged.              Expected Discharge Plan: Home/Self Care Barriers to Discharge: Continued Medical Work up   Patient Goals and CMS Choice        Expected Discharge Plan and Services Expected Discharge Plan: Home/Self Care       Living arrangements for the past 2 months: Single Family Home                          Prior Living Arrangements/Services Living arrangements for the past 2 months: Single Family Home Lives with:: Self   Do you feel safe going back to the place where you live?: Yes            Criminal Activity/Legal Involvement Pertinent to Current Situation/Hospitalization: No - Comment as needed  Activities of Daily Living Home Assistive Devices/Equipment: Eyeglasses, Cane (specify quad or straight), Walker (specify type) ADL Screening (condition at time of admission) Patient's cognitive ability adequate to safely complete daily activities?: Yes Is the patient deaf or have difficulty hearing?: No Does the patient have difficulty seeing, even when wearing glasses/contacts?: No Does the patient have difficulty concentrating, remembering, or making decisions?: No Patient able to express need for assistance with ADLs?: Yes Does the patient have difficulty dressing or bathing?:  No Independently performs ADLs?: Yes (appropriate for developmental age) Does the patient have difficulty walking or climbing stairs?: Yes Weakness of Legs: Both Weakness of Arms/Hands: None  Permission Sought/Granted                  Emotional Assessment Appearance:: Appears stated age, Well-Groomed Attitude/Demeanor/Rapport: Gracious Affect (typically observed): Accepting Orientation: : Oriented to Self, Oriented to Place, Oriented to  Time, Oriented to Situation Alcohol / Substance Use: Not Applicable    Admission diagnosis:  Hypoxia [U63.33] Acute systolic congestive heart failure ([place]) [I50.21] Patient Active Problem List   Diagnosis Date Noted  . Acute CHF (congestive heart failure) ([place]) 04/30/2018   PCP:  Kirk Ruths, MD Pharmacy:   [id number] [address], [place] [address] Phone: [phone] Fax: [phone]     Social Determinants of Health (SDOH) Interventions    Readmission Risk Interventions No flowsheet data found.

## 2018-05-02 NOTE — Plan of Care (Signed)
Nutrition Education Note  RD consulted for nutrition education regarding new onset CHF.  Spoke with pt at bedside, who reports that she has a great appetite and consumes 3 meals per day. She lives at home alone alone and mostly eats out. Breakfast is typically scrambled eggs, sausage, english muffin and black coffee; Lunch: BBQ sandwich; DInner: hibachi steak and white rice OR a vegetable plate. Pt does not add salt to food.   Pt reports that she is motivated to manage HF to help regain her independence. Emphasized daily weight monitoring. Also discussed ways to choose healthier items when eating out.   RD provided "Low Sodium Nutrition Therapy" handout from the Academy of [employer]. Reviewed patient's dietary recall. Provided examples on ways to decrease sodium intake in diet. Discouraged intake of processed foods and use of salt shaker. Encouraged fresh fruits and vegetables as well as whole grain sources of carbohydrates to maximize fiber intake.   RD discussed why it is important for patient to adhere to diet recommendations, and emphasized the role of fluids, foods to avoid, and importance of weighing self daily. Teach back method used.  Expect fair compliance.  Body mass index is 37.56 kg/m. Pt meets criteria for obesity, class I based on current BMI.  Current diet order is Heart Healthy, patient is consuming approximately 100% of meals at this time. Labs and medications reviewed. No further nutrition interventions warranted at this time. RD contact information provided. If additional nutrition issues arise, please re-consult RD.   Lori Browning A. Lori Browning, RD, LDN, [place] Registered Dietitian II Certified Diabetes Care and Education Specialist Pager: [phone] After hours Pager: [phone]

## 2018-05-03 LAB — BASIC METABOLIC PANEL
Anion gap: 10 (ref 5–15)
BUN: 46 mg/dL — ABNORMAL HIGH (ref 8–23)
CO2: 29 mmol/L (ref 22–32)
Calcium: 9 mg/dL (ref 8.9–10.3)
Chloride: 99 mmol/L (ref 98–111)
Creatinine, Ser: 0.95 mg/dL (ref 0.44–1.00)
GFR calc Af Amer: >60 mL/min (ref >60)
GFR calc non Af Amer: 55 mL/min — ABNORMAL LOW (ref >60)
Glucose, Bld: 154 mg/dL — ABNORMAL HIGH (ref 70–99)
POTASSIUM: 3.7 mmol/L (ref 3.5–5.1)
Sodium: 138 mmol/L (ref 135–145)

## 2018-05-03 MED ORDER — POLYETHYLENE GLYCOL 3350 17 G PO PACK
17.0000 g | PACK | Freq: Every day | ORAL | Status: DC | PRN
  Administered 2018-05-03: 17 g via ORAL
  Filled 2018-05-03: qty 1

## 2018-05-03 MED ORDER — SODIUM CHLORIDE 0.9% FLUSH
10.0000 mL | Freq: Two times a day (BID) | INTRAVENOUS | Status: DC
  Administered 2018-05-03: 10 mL via INTRAVENOUS

## 2018-05-03 NOTE — Consult Note (Signed)
Pulmonary Medicine          Date: 05/03/2018,   MRN# [id number] Lori Browning Aug 26, 1930     AdmissionWeight: 109.4 kg                 CurrentWeight: 106.2 kg      CHIEF COMPLAINT:   Cough and SOB at rest   HISTORY OF PRESENT ILLNESS   This is an 85 yo female w/hx of dyslipidemia, HTN, diastolic CHF, urinary incontinence, liver cysts, pulmonary nodules.  She was admitted with bronchitis and has recently been on antibiotics and steroid burst, which she states may have mildly improved her symptoms.  She was seen by cardiology and was medically optimized, however remains with SOB and increased O2 requirment.  I was consulted by Dr Stark Jock for continued wheezing, dyspnea and increased O2 requirement.  Patient is a never smoker, has not seen pulmonology in the past, had CT chest 2016 which I reviewed and showed liver cysts and bilateral <71mm solid pulmonary nodules.  Patient denies having fevers but relates that she has gentetic condition which prevents her from having any febrile presentation.  She denies constitutional symptoms, she denies family or personal hx of asthma.     PAST MEDICAL HISTORY   Past Medical History:  Diagnosis Date  . Hyperlipemia   . Hypertension   . Urge incontinence      SURGICAL HISTORY   Past Surgical History:  Procedure Laterality Date  . BREAST CYST EXCISION    . CATARACT EXTRACTION    . hip replacment    . REPLACEMENT TOTAL KNEE       FAMILY HISTORY   Family History  Problem Relation Age of Onset  . Hypertension Mother      SOCIAL HISTORY   Social History   Tobacco Use  . Smoking status: Never Smoker  . Smokeless tobacco: Never Used  Substance Use Topics  . Alcohol use: Never    Frequency: Never  . Drug use: Never     MEDICATIONS    Home Medication:    Current Medication:  Current Facility-Administered Medications:  .  0.9 %  sodium chloride infusion, 250 mL, Intravenous, PRN, Dustin Flock, MD .   acetaminophen (TYLENOL) tablet 650 mg, 650 mg, Oral, Q4H PRN, Dustin Flock, MD, 650 mg at 04/30/18 2104 .  aspirin EC tablet 81 mg, 81 mg, Oral, Daily, Dustin Flock, MD .  carvedilol (COREG) tablet 3.125 mg, 3.125 mg, Oral, BID WC, Jake Bathe, FNP, 3.125 mg at 05/03/18 0813 .  enoxaparin (LOVENOX) injection 40 mg, 40 mg, Subcutaneous, Q24H, Dustin Flock, MD, 40 mg at 05/02/18 2123 .  furosemide (LASIX) injection 40 mg, 40 mg, Intravenous, BID, Dustin Flock, MD, 40 mg at 05/03/18 0813 .  guaiFENesin-dextromethorphan (ROBITUSSIN DM) 100-10 MG/5ML syrup 5 mL, 5 mL, Oral, Q4H PRN, Dustin Flock, MD, 5 mL at 05/03/18 0606 .  hydrALAZINE (APRESOLINE) tablet 50 mg, 50 mg, Oral, Q8H, Jake Bathe, FNP, 50 mg at 05/03/18 1400 .  ipratropium-albuterol (DUONEB) 0.5-2.5 (3) MG/3ML nebulizer solution 3 mL, 3 mL, Nebulization, Q4H PRN, Dustin Flock, MD, 3 mL at 05/03/18 0606 .  isosorbide mononitrate (IMDUR) 24 hr tablet 30 mg, 30 mg, Oral, Daily, Dustin Flock, MD, 30 mg at 05/03/18 0813 .  lisinopril (PRINIVIL,ZESTRIL) tablet 10 mg, 10 mg, Oral, Daily, Neoma Laming A, MD, 10 mg at 05/03/18 [phone] .  methylPREDNISolone sodium succinate (SOLU-MEDROL) 40 mg/mL injection 40 mg, 40 mg, Intravenous, Q12H, [place], Jude, MD, 40  mg at 05/03/18 0555 .  ondansetron (ZOFRAN) injection 4 mg, 4 mg, Intravenous, Q6H PRN, Dustin Flock, MD .  polyethylene glycol (MIRALAX / GLYCOLAX) packet 17 g, 17 g, Oral, Daily PRN, Ojie, Jude, MD .  pravastatin (PRAVACHOL) tablet 40 mg, 40 mg, Oral, Daily, Ojie, Jude, MD, 40 mg at 05/03/18 0813 .  sodium chloride flush (NS) 0.9 % injection 3 mL, 3 mL, Intravenous, Q12H, Dustin Flock, MD, 3 mL at 05/03/18 0814 .  sodium chloride flush (NS) 0.9 % injection 3 mL, 3 mL, Intravenous, PRN, Dustin Flock, MD .  spironolactone (ALDACTONE) tablet 25 mg, 25 mg, Oral, Daily, Neoma Laming A, MD, 25 mg at 05/03/18 0813    ALLERGIES   Sulfa antibiotics      REVIEW OF SYSTEMS    Review of Systems:  Gen:  Denies  fever, sweats, chills weigh loss  HEENT: Denies blurred vision, double vision, ear pain, eye pain, hearing loss, nose bleeds, sore throat Cardiac:  No dizziness, chest pain or heaviness, chest tightness,edema Resp:  reportscough or sputum porduction, shortness of breath,wheezing Gi: Denies swallowing difficulty, stomach pain, nausea or vomiting, diarrhea, constipation, bowel incontinence Gu:  Denies bladder incontinence, burning urine Ext:   Denies Joint pain, stiffness or swelling Skin: Denies  skin rash, easy bruising or bleeding or hives Endoc:  Denies polyuria, polydipsia , polyphagia or weight change Psych:   Denies depression, insomnia or hallucinations   Other:  All other systems negative   VS: BP 137/66 (BP Location: Right Arm)   Pulse 61   Temp 97.6 F (36.4 C) (Oral)   Resp 18   Ht 5\' 6"  (1.676 m)   Wt 106.2 kg   SpO2 97%   BMI 37.79 kg/m      PHYSICAL EXAM    GENERAL:NAD, no fevers, chills, no weakness no fatigue HEAD: Normocephalic, atraumatic.  EYES: Pupils equal, round, reactive to light. Extraocular muscles intact. No scleral icterus.  MOUTH: Moist mucosal membrane. Dentition intact. No abscess noted.  EAR, NOSE, THROAT: Clear without exudates. No external lesions.  NECK: Supple. No thyromegaly. No nodules. No JVD.  PULMONARY: Mild bilateral rhohchi with decreased breath sounds at bases bilaterally  CARDIOVASCULAR: S1 and S2. Regular rate and rhythm. No murmurs, rubs, or gallops. No edema. Pedal pulses 2+ bilaterally.  GASTROINTESTINAL: Soft, nontender, nondistended. No masses. Positive bowel sounds. No hepatosplenomegaly.  MUSCULOSKELETAL: No swelling, clubbing, or edema. Range of motion full in all extremities.  NEUROLOGIC: Cranial nerves II through XII are intact. No gross focal neurological deficits. Sensation intact. Reflexes intact.  SKIN: No ulceration, lesions, rashes, or cyanosis. Skin warm  and dry. Turgor intact.  PSYCHIATRIC: Mood, affect within normal limits. The patient is awake, alert and oriented x 3. Insight, judgment intact.       IMAGING    Dg Chest Port 1 View  Result Date: 04/30/2018 CLINICAL DATA:  Hypoxia and dyspnea today. The patient was diagnosed with bronchitis/pneumonia 04/28/2018. EXAM: PORTABLE CHEST 1 VIEW COMPARISON:  CT chest 02/01/2015. FINDINGS: There is cardiomegaly and vascular congestion. No consolidative process, pneumothorax or pleural effusion. No acute or focal bony abnormality. IMPRESSION: Cardiomegaly and pulmonary vascular congestion. Electronically Signed   By: Inge Rise M.D.   On: 04/30/2018 12:27      ASSESSMENT/PLAN   Acute hypoxemic respiratory failure     - currently with acute diastolic CHF exacerbation EF >65% with concomitant bronchitic syndrome-diuresing now    - no hx of COPD/Asthma    - lung auscultation with decreased  breath sounds suggestive of atelectasis induced hypoxemia - will initiate chest PT    - would continue with bronchodilators q4h     - physical therapy to get OOB will help combat atelectasis as well    - if patient can be weaned from O2 then can safely d/c with close follow up in pulm clinic.     - will make outpatient appt for patient to be seen     -if above does not work will obtain CT chest to characterize lung parenchyma   Thank you for allowing me to participate in the care of this patient.   Patient/Family are satisfied with care plan and all questions have been answered.  This document was prepared using Dragon voice recognition software and may include unintentional dictation errors.     Ottie Glazier, M.D.  Division of [place]

## 2018-05-03 NOTE — Progress Notes (Signed)
SUBJECTIVE: Patient is still short of breath   Vitals:   05/02/18 1936 05/03/18 0354 05/03/18 0758 05/03/18 0809  BP: 138/69 (!) 161/81 (!) 157/89   Pulse: 69 67 67   Resp: 20 20  18   Temp: 97.9 F (36.6 C) 97.9 F (36.6 C) 97.6 F (36.4 C)   TempSrc: Oral Oral Oral   SpO2: 92% 91% 93%   Weight:  106.2 kg    Height:        Intake/Output Summary (Last 24 hours) at 05/03/2018 1025 Last data filed at 05/03/2018 1009 Gross per 24 hour  Intake 603 ml  Output 1150 ml  Net -547 ml    LABS: Basic Metabolic Panel: Recent Labs    05/02/18 0417 05/03/18 0458  NA 138 138  K 3.5 3.7  CL 100 99  CO2 30 29  GLUCOSE 167* 154*  BUN 33* 46*  CREATININE 0.83 0.95  CALCIUM 8.8* 9.0  MG 2.3  --   PHOS 4.3  --    Liver Function Tests: Recent Labs    04/30/18 1210  AST 38  ALT 24  ALKPHOS 46  BILITOT 0.9  PROT 8.0  ALBUMIN 3.6   No results for input(s): LIPASE, AMYLASE in the last 72 hours. CBC: Recent Labs    04/30/18 1210  WBC 14.9*  NEUTROABS 9.5*  HGB 14.7  HCT 47.2*  MCV 92.2  PLT 170   Cardiac Enzymes: Recent Labs    04/30/18 1210  TROPONINI 0.03*   BNP: Invalid input(s): POCBNP D-Dimer: No results for input(s): DDIMER in the last 72 hours. Hemoglobin A1C: No results for input(s): HGBA1C in the last 72 hours. Fasting Lipid Panel: No results for input(s): CHOL, HDL, LDLCALC, TRIG, CHOLHDL, LDLDIRECT in the last 72 hours. Thyroid Function Tests: Recent Labs    04/30/18 1619  TSH 1.297   Anemia Panel: No results for input(s): VITAMINB12, FOLATE, FERRITIN, TIBC, IRON, RETICCTPCT in the last 72 hours.   PHYSICAL EXAM General: Well developed, well nourished, in no acute distress HEENT:  Normocephalic and atramatic Neck:  No JVD.  Lungs: Clear bilaterally to auscultation and percussion. Heart: HRRR . Normal S1 and S2 without gallops or murmurs.  Abdomen: Bowel sounds are positive, abdomen soft and non-tender  Msk:  Back normal, normal gait.  Normal strength and tone for age. Extremities: No clubbing, cyanosis or edema.   Neuro: Alert and oriented X 3. Psych:  Good affect, responds appropriately  TELEMETRY: Sinus rhythm  ASSESSMENT AND PLAN: Congestive heart failure due to diastolic dysfunction, patient has received Lasix and has been weaned off of carvedilol as that might have been causing wheezing.  She is still hypoxic and requiring oxygen advise further work-up by pulmonary.  She had mild minor luminal irregularities on CTA coronaries with calcium score 99 and echocardiogram had ejection fraction 60% with mild diastolic dysfunction and mild mitral regurgitation.  May need CT of the chest also.  And PFTs.  Active Problems:   Acute CHF (congestive heart failure) ([place])    Lori Browning A, MD, [facility] 05/03/2018 10:25 AM

## 2018-05-03 NOTE — Progress Notes (Signed)
SATURATION QUALIFICATIONS: (This note is used to comply with regulatory documentation for home oxygen)  Patient Saturations on Room Air at Rest = 84%  Patient Saturations on Room Air while Ambulating = n/a%  Patient Saturations on n/a Liters of oxygen while Ambulating = n/a%  Please briefly explain why patient needs home oxygen: patients oxygen saturation dropped to 84% on room air while at rest- went back up to 92% on 2L while at rest.

## 2018-05-03 NOTE — Care Management Important Message (Signed)
Important Message  Patient Details  Name: Lori Browning MRN: [id number] Date of Birth: 10-22-1930   Medicare Important Message Given:  Yes    Dannette Barbara 05/03/2018, 11:16 AM

## 2018-05-03 NOTE — Plan of Care (Signed)
Patient complains of dyspnea at rest, remains on oxygen. Patient reports sleeping in recliner at home but is able to sleep in bed while admitted.

## 2018-05-03 NOTE — Progress Notes (Signed)
[place] [facility] [place] NAME: Lori Browning    MR#:  [id number]  DATE OF BIRTH:  Jun 15, 1930  SUBJECTIVE:  CHIEF COMPLAINT:   Chief Complaint  Patient presents with  . Shortness of Breath  . Cough   No new complaints.  Still had some mild shortness of breath and wheezing.  No fevers.  Denies any sick contacts.  No recent travel.  Patient however still hypoxic and oxygen saturation dropped to the 80s on room air this morning.  Pulmonary consultation placed.   REVIEW OF SYSTEMS:  Review of Systems  Constitutional: Negative for chills and fever.  HENT: Negative for hearing loss and tinnitus.   Eyes: Negative for blurred vision and double vision.  Respiratory: Positive for shortness of breath. Negative for wheezing.   Cardiovascular: Negative for chest pain, palpitations and orthopnea.  Gastrointestinal: Negative for heartburn, nausea and vomiting.  Genitourinary: Negative for dysuria and urgency.  Musculoskeletal: Negative for myalgias and neck pain.  Skin: Negative for itching and rash.  Neurological: Negative for dizziness and headaches.  Psychiatric/Behavioral: Negative for depression and hallucinations.    DRUG ALLERGIES:   Allergies  Allergen Reactions  . Sulfa Antibiotics Nausea And Vomiting and Rash   VITALS:  Blood pressure 137/66, pulse 61, temperature 97.6 F (36.4 C), temperature source Oral, resp. rate 18, height 5\' 6"  (1.676 m), weight 106.2 kg, SpO2 97 %. PHYSICAL EXAMINATION:  Physical Exam  Constitutional: She is oriented to person, place, and time. She appears well-developed and well-nourished.  HENT:  Head: Normocephalic.  Right Ear: External ear normal.  Left Ear: External ear normal.  Eyes: Pupils are equal, round, and reactive to light. Conjunctivae are normal. Right eye exhibits no discharge.  Neck: Normal range of motion. Neck supple. No thyromegaly present.  Cardiovascular: Normal rate, regular rhythm and  normal heart sounds.  Respiratory: Effort normal. She has no wheezes. She has rales.  GI: Soft. Bowel sounds are normal. There is no abdominal tenderness.  Musculoskeletal: Normal range of motion.        General: Edema present.  Neurological: She is alert and oriented to person, place, and time.  Skin: Skin is warm and dry. She is not diaphoretic.  Psychiatric: She has a normal mood and affect. Her behavior is normal.   LABORATORY PANEL:  Female CBC Recent Labs  Lab 04/30/18 1210  WBC 14.9*  HGB 14.7  HCT 47.2*  PLT 170   ------------------------------------------------------------------------------------------------------------------ Chemistries  Recent Labs  Lab 04/30/18 1210  05/02/18 0417 05/03/18 0458  NA 136   < > 138 138  K 4.8   < > 3.5 3.7  CL 103   < > 100 99  CO2 25   < > 30 29  GLUCOSE 172*   < > 167* 154*  BUN 17   < > 33* 46*  CREATININE 0.76   < > 0.83 0.95  CALCIUM 8.8*   < > 8.8* 9.0  MG  --   --  2.3  --   AST 38  --   --   --   ALT 24  --   --   --   ALKPHOS 46  --   --   --   BILITOT 0.9  --   --   --    < > = values in this interval not displayed.   RADIOLOGY:  No results found. ASSESSMENT AND PLAN:    Patient is a 85 year old white female  with history of hypertension presenting with complaint of shortness of breath  1.  Acute diastolic congestive heart failure exacerbation  Being diuresed with IV Lasix.   2D echocardiogram from 01/20 documented in cardiologist note with ejection fraction of 60% with grade 1 diastolic dysfunction.  Appreciate cardiologist input.  Cardiologist added hydralazine 25 mg p.o. every 8 hourly.  Lisinopril increased to 10 mg daily.   Monitor daily weights.    2.   Acute bronchitis with cough with bronchospasm with wheezing Patient treated for bronchitis recently.  Placed on IV steroids on admission and nebulizer treatments.  Patient noted to have been on levofloxacin prior to admission and has completed treatment  duration. Currently on gradual steroid taper. Patient still hypoxic requiring oxygen this morning.  Not on home oxygen therapy.  Requested for pulmonary consultation for input.  Patient may need CT chest and possible PFTs.  We will follow-up on pulmonary recommendations.  Influenza test negative. No sick contacts.  No recent travel history.    3.  Hypertensive urgency. Blood pressure better controlled on current regimen  4.  Hyperlipidemia Continue statins   DVT prophylaxis; Lovenox   All the records are reviewed and case discussed with Care Management/Social Worker. Management plans discussed with the patient, family and they are in agreement.  CODE STATUS: DNR  TOTAL TIME TAKING CARE OF THIS PATIENT: 36 minutes.   More than 50% of the time was spent in counseling/coordination of care: YES  POSSIBLE D/C IN 1-2 DAYS, DEPENDING ON CLINICAL CONDITION.   Xiao Graul M.D on 05/03/2018 at 4:13 PM  Between 7am to 6pm - Pager - [phone]  After 6pm go to [email or web address] - [employer] [facility]  Office  [phone]  CC: Primary care physician; Kirk Ruths, MD  Note: This dictation was prepared with Dragon dictation along with smaller phrase technology. Any transcriptional errors that result from this process are unintentional.

## 2018-05-04 ENCOUNTER — Inpatient Hospital Stay: Payer: Medicare HMO

## 2018-05-04 LAB — BASIC METABOLIC PANEL
Anion gap: 10 (ref 5–15)
BUN: 56 mg/dL — ABNORMAL HIGH (ref 8–23)
CALCIUM: 8.8 mg/dL — AB (ref 8.9–10.3)
CO2: 29 mmol/L (ref 22–32)
Chloride: 97 mmol/L — ABNORMAL LOW (ref 98–111)
Creatinine, Ser: 0.98 mg/dL (ref 0.44–1.00)
GFR calc Af Amer: >60 mL/min (ref >60)
GFR calc non Af Amer: 53 mL/min — ABNORMAL LOW (ref >60)
Glucose, Bld: 168 mg/dL — ABNORMAL HIGH (ref 70–99)
Potassium: 3.6 mmol/L (ref 3.5–5.1)
Sodium: 136 mmol/L (ref 135–145)

## 2018-05-04 MED ORDER — IOHEXOL 300 MG/ML  SOLN
75.0000 mL | Freq: Once | INTRAMUSCULAR | Status: AC | PRN
  Administered 2018-05-04: 75 mL via INTRAVENOUS

## 2018-05-04 NOTE — Evaluation (Signed)
Physical Therapy Evaluation Patient Details Name: Lori Browning MRN: [id number] DOB: 29-Apr-1930 Today's Date: 05/04/2018   History of Present Illness  Patient is 85 yo female presented to ED due to SOB, workup showed acute diastolic CHF. PMH of HLD, HTN, incontinence.     Clinical Impression  Patient alert, agreeable, behavior WFLs. Reported that she lives alone, previously independent, does endorse that she would use her [facility] for community ambulation.  Patient demonstrated bed mobility with supervision, sit <> stand with RW and verbal cues to increase safety. Pt ambulated ~184ft with RW and CGA. Patient also instructed in PLB with good carry over. Oxygen monitored throughout session, 86% on room air sitting EOB, placed on 2L O2 via nasal cannula >89% throughout rest of session, 90% during ambulation. RN notified of oxygen status. Overall the patient demonstrated deficits (see "PT Problem List") that impede the patient's functional abilities, safety, and mobility and would benefit from skilled PT intervention. Recommendation is HHPT.      Follow Up Recommendations Home health PT    Equipment Recommendations  None recommended by PT    Recommendations for Other Services       Precautions / Restrictions Precautions Precautions: Fall Precaution Comments: watch O2 Restrictions Weight Bearing Restrictions: No      Mobility  Bed Mobility Overal bed mobility: Needs Assistance Bed Mobility: Supine to Sit     Supine to sit: Supervision        Transfers Overall transfer level: Needs assistance Equipment used: Rolling walker (2 wheeled) Transfers: Sit to/from Stand Sit to Stand: Min guard            Ambulation/Gait Ambulation/Gait assistance: Min guard Gait Distance (Feet): 180 Feet Assistive device: Rolling walker (2 wheeled) Gait Pattern/deviations: WFL(Within Functional Limits)     General Gait Details: decreased  Stairs            Wheelchair Mobility     Modified Rankin (Stroke Patients Only)       Balance Overall balance assessment: Needs assistance   Sitting balance-Leahy Scale: Good       Standing balance-Leahy Scale: Fair                               Pertinent Vitals/Pain Pain Assessment: No/denies pain    Home Living Family/patient expects to be discharged to:: Private residence Living Arrangements: Alone;Other relatives Available Help at Discharge: Available PRN/intermittently Type of Home: House Home Access: Ramped entrance     Home Layout: One level Home Equipment: Cane - single point;Walker - 2 wheels      Prior Function Level of Independence: Independent               Hand Dominance        Extremity/Trunk Assessment   Upper Extremity Assessment Upper Extremity Assessment: Generalized weakness    Lower Extremity Assessment Lower Extremity Assessment: Generalized weakness    Cervical / Trunk Assessment Cervical / Trunk Assessment: Normal  Communication   Communication: No difficulties  Cognition Arousal/Alertness: Awake/alert Behavior During Therapy: WFL for tasks assessed/performed Overall Cognitive Status: Within Functional Limits for tasks assessed                                        General Comments      Exercises     Assessment/Plan    PT Assessment Patient  needs continued PT services  PT Problem List Decreased strength;Cardiopulmonary status limiting activity;Decreased range of motion;Decreased activity tolerance;Decreased balance;Decreased mobility       PT Treatment Interventions DME instruction;Therapeutic exercise;Gait training;Balance training;Stair training;Functional mobility training;Therapeutic activities;Patient/family education;Neuromuscular re-education    PT Goals (Current goals can be found in the Care Plan section)  Acute Rehab PT Goals Patient Stated Goal: to go home PT Goal Formulation: With patient Time For Goal  Achievement: 05/18/18 Potential to Achieve Goals: Good    Frequency Min 2X/week   Barriers to discharge        Co-evaluation               AM-PAC PT "6 Clicks" Mobility  Outcome Measure Help needed turning from your back to your side while in a flat bed without using bedrails?: A Little Help needed moving from lying on your back to sitting on the side of a flat bed without using bedrails?: A Little Help needed moving to and from a bed to a chair (including a wheelchair)?: A Little Help needed standing up from a chair using your arms (e.g., wheelchair or bedside chair)?: A Little Help needed to walk in hospital room?: A Little Help needed climbing 3-5 steps with a railing? : A Little 6 Click Score: 18    End of Session Equipment Utilized During Treatment: Gait belt;Oxygen(2L) Activity Tolerance: Patient tolerated treatment well Patient left: in chair;with chair alarm set;with call bell/phone within reach Nurse Communication: Mobility status PT Visit Diagnosis: Other symptoms and signs involving the nervous system (R29.898);Muscle weakness (generalized) (M62.81);Difficulty in walking, not elsewhere classified (R26.2)    Time: 4656-8127 PT Time Calculation (min) (ACUTE ONLY): 34 min   Charges:   PT Evaluation $PT Eval Low Complexity: 1 Low PT Treatments $Therapeutic Activity: 8-22 mins        Lieutenant Diego PT, DPT 10:48 AM,05/04/18 [phone]

## 2018-05-04 NOTE — Plan of Care (Signed)
Patient OOB with assistance, still requiring oxygen at this time, see saturation qualification note.   Problem: Education: Goal: Knowledge of General Education information will improve Description Including pain rating scale, medication(s)/side effects and non-pharmacologic comfort measures Outcome: Progressing   Problem: Activity: Goal: Risk for activity intolerance will decrease Outcome: Progressing   Problem: Cardiac: Goal: Ability to achieve and maintain adequate cardiopulmonary perfusion will improve Outcome: Not Progressing

## 2018-05-04 NOTE — Progress Notes (Signed)
Pulmonary Medicine          Date: 05/04/2018,   MRN# [id number] Lori Browning [birth year]     AdmissionWeight: 109.4 kg                 CurrentWeight: 106.8 kg       SUBJECTIVE   Patient reports improved breathing post using incentive spirometer and Acapella device. I have encouraged her to do this several times each hour with the goal of getting off O2 therapy completely.  Discussed care plan with patient and hospitalist, plan for CT chest with contrast if above bronchopulmonary hygiene and physical therapy today is insufficient to wean oxygen.   PAST MEDICAL HISTORY   Past Medical History:  Diagnosis Date  . Hyperlipemia   . Hypertension   . Urge incontinence      SURGICAL HISTORY   Past Surgical History:  Procedure Laterality Date  . BREAST CYST EXCISION    . CATARACT EXTRACTION    . hip replacment    . REPLACEMENT TOTAL KNEE       FAMILY HISTORY   Family History  Problem Relation Age of Onset  . Hypertension Mother      SOCIAL HISTORY   Social History   Tobacco Use  . Smoking status: Never Smoker  . Smokeless tobacco: Never Used  Substance Use Topics  . Alcohol use: Never    Frequency: Never  . Drug use: Never     MEDICATIONS    Home Medication:    Current Medication:  Current Facility-Administered Medications:  .  0.9 %  sodium chloride infusion, 250 mL, Intravenous, PRN, Dustin Flock, MD .  acetaminophen (TYLENOL) tablet 650 mg, 650 mg, Oral, Q4H PRN, Dustin Flock, MD, 650 mg at 04/30/18 2104 .  aspirin EC tablet 81 mg, 81 mg, Oral, Daily, Dustin Flock, MD .  carvedilol (COREG) tablet 3.125 mg, 3.125 mg, Oral, BID WC, Jake Bathe, FNP, 3.125 mg at 05/04/18 0829 .  enoxaparin (LOVENOX) injection 40 mg, 40 mg, Subcutaneous, Q24H, Dustin Flock, MD, 40 mg at 05/03/18 2248 .  furosemide (LASIX) injection 40 mg, 40 mg, Intravenous, BID, Dustin Flock, MD, 40 mg at 05/04/18 0830 .   guaiFENesin-dextromethorphan (ROBITUSSIN DM) 100-10 MG/5ML syrup 5 mL, 5 mL, Oral, Q4H PRN, Dustin Flock, MD, 5 mL at 05/03/18 0606 .  hydrALAZINE (APRESOLINE) tablet 50 mg, 50 mg, Oral, Q8H, Jake Bathe, FNP, 50 mg at 05/04/18 0533 .  ipratropium-albuterol (DUONEB) 0.5-2.5 (3) MG/3ML nebulizer solution 3 mL, 3 mL, Nebulization, Q4H PRN, Dustin Flock, MD, 3 mL at 05/03/18 0606 .  isosorbide mononitrate (IMDUR) 24 hr tablet 30 mg, 30 mg, Oral, Daily, Dustin Flock, MD, 30 mg at 05/04/18 0829 .  lisinopril (PRINIVIL,ZESTRIL) tablet 10 mg, 10 mg, Oral, Daily, Neoma Laming A, MD, 10 mg at 05/04/18 0829 .  methylPREDNISolone sodium succinate (SOLU-MEDROL) 40 mg/mL injection 40 mg, 40 mg, Intravenous, Q12H, [place]jie, Jude, MD, 40 mg at 05/04/18 0533 .  ondansetron (ZOFRAN) injection 4 mg, 4 mg, Intravenous, Q6H PRN, Dustin Flock, MD .  polyethylene glycol (MIRALAX / GLYCOLAX) packet 17 g, 17 g, Oral, Daily PRN, Stark Jock, Jude, MD, 17 g at 05/03/18 1723 .  pravastatin (PRAVACHOL) tablet 40 mg, 40 mg, Oral, Daily, Ojie, Jude, MD, 40 mg at 05/04/18 0830 .  sodium chloride flush (NS) 0.9 % injection 10 mL, 10 mL, Intravenous, Q12H, Ojie, Jude, MD, 10 mL at 05/03/18 2248 .  sodium chloride flush (NS) 0.9 % injection 3 mL,  3 mL, Intravenous, Q12H, Dustin Flock, MD, 3 mL at 05/04/18 0830 .  sodium chloride flush (NS) 0.9 % injection 3 mL, 3 mL, Intravenous, PRN, Dustin Flock, MD .  spironolactone (ALDACTONE) tablet 25 mg, 25 mg, Oral, Daily, Neoma Laming A, MD, 25 mg at 05/04/18 0830    ALLERGIES   Sulfa antibiotics     REVIEW OF SYSTEMS    Review of Systems:  Gen:  Denies  fever, sweats, chills weigh loss  HEENT: Denies blurred vision, double vision, ear pain, eye pain, hearing loss, nose bleeds, sore throat Cardiac:  No dizziness, chest pain or heaviness, chest tightness,edema Resp:   Dyspnea on exertion  Gi: Denies swallowing difficulty, stomach pain, nausea or vomiting,  diarrhea, constipation, bowel incontinence Gu:  Denies bladder incontinence, burning urine Ext:   Denies Joint pain, stiffness or swelling Skin: Denies  skin rash, easy bruising or bleeding or hives Endoc:  Denies polyuria, polydipsia , polyphagia or weight change Psych:   Denies depression, insomnia or hallucinations   Other:  All other systems negative   VS: BP (!) 149/71 (BP Location: Right Arm)   Pulse 63   Temp 97.9 F (36.6 C) (Oral)   Resp 20   Ht 5\' 6"  (1.676 m)   Wt 106.8 kg   SpO2 95%   BMI 38.01 kg/m      PHYSICAL EXAM    GENERAL:NAD, no fevers, chills, no weakness no fatigue HEAD: Normocephalic, atraumatic.  EYES: Pupils equal, round, reactive to light. Extraocular muscles intact. No scleral icterus.  MOUTH: Moist mucosal membrane. Dentition intact. No abscess noted.  EAR, NOSE, THROAT: Clear without exudates. No external lesions.  NECK: Supple. No thyromegaly. No nodules. No JVD.  PULMONARY: Decreased breath sounds bilaterally worse at the base without wheezing, crackles or rhonchorous breath sounds CARDIOVASCULAR: S1 and S2. Regular rate and rhythm. No murmurs, rubs, or gallops. No edema. Pedal pulses 2+ bilaterally.  GASTROINTESTINAL: Soft, nontender, nondistended. No masses. Positive bowel sounds. No hepatosplenomegaly.  MUSCULOSKELETAL: No swelling, clubbing, or edema. Range of motion full in all extremities.  NEUROLOGIC: Cranial nerves II through XII are intact. No gross focal neurological deficits. Sensation intact. Reflexes intact.  SKIN: No ulceration, lesions, rashes, or cyanosis. Skin warm and dry. Turgor intact.  PSYCHIATRIC: Mood, affect within normal limits. The patient is awake, alert and oriented x 3. Insight, judgment intact.       IMAGING    Dg Chest Port 1 View  Result Date: 04/30/2018 CLINICAL DATA:  Hypoxia and dyspnea today. The patient was diagnosed with bronchitis/pneumonia 04/28/2018. EXAM: PORTABLE CHEST 1 VIEW COMPARISON:  CT  chest 02/01/2015. FINDINGS: There is cardiomegaly and vascular congestion. No consolidative process, pneumothorax or pleural effusion. No acute or focal bony abnormality. IMPRESSION: Cardiomegaly and pulmonary vascular congestion. Electronically Signed   By: Inge Rise M.D.   On: 04/30/2018 12:27      ASSESSMENT/PLAN   Acute hypoxemic respiratory failure     - currently with acute diastolic CHF exacerbation EF >65% with concomitant bronchitic syndrome-diuresing now    - no hx of COPD/Asthma    - lung auscultation with decreased breath sounds suggestive of atelectasis induced hypoxemia                     -attempting to minimally invasive strategy with bronchopulmonary hygiene and physical therapy to recruit atelectatic segment    - continue with bronchodilators q4h     -Discussed with hospitalist Dr. Stark Jock, he is agreeable to CT  chest if above is insufficient to wean off oxygen   Pulmonary nodules and hx of lung scarring per previous CT chest     -will follow-up on outpatient basis unless patient has CT chest while hospitalized, will be looking for increased interval size from 2016 in bilateral pulmonary nodules suggestive of neoplasm as well as worsening fibrosis at the right middle lobe as previously noted.    Thank you for allowing me to participate in the care of this patient.     Patient/Family are satisfied with care plan and all questions have been answered.  This document was prepared using Dragon voice recognition software and may include unintentional dictation errors.     Ottie Glazier, M.D.  Division of [facility]

## 2018-05-04 NOTE — Progress Notes (Addendum)
SUBJECTIVE: Pt is feeling better than yesterday, but still wheezing and short of breath on 2L [place].   Vitals:   05/03/18 1657 05/03/18 2013 05/04/18 0425 05/04/18 0740  BP: (!) 157/78 (!) 159/74 (!) 141/69 (!) 149/71  Pulse: 66 67 (!) 58 63  Resp:  18 18 20   Temp: 98.6 F (37 C) 97.8 F (36.6 C) 97.6 F (36.4 C) 97.9 F (36.6 C)  TempSrc: Oral Oral Oral Oral  SpO2: 97% 96% 95% 95%  Weight:   106.8 kg   Height:        Intake/Output Summary (Last 24 hours) at 05/04/2018 0924 Last data filed at 05/03/2018 2000 Gross per 24 hour  Intake 240 ml  Output 800 ml  Net -560 ml    LABS: Basic Metabolic Panel: Recent Labs    05/02/18 0417 05/03/18 0458 05/04/18 0423  NA 138 138 136  K 3.5 3.7 3.6  CL 100 99 97*  CO2 30 29 29   GLUCOSE 167* 154* 168*  BUN 33* 46* 56*  CREATININE 0.83 0.95 0.98  CALCIUM 8.8* 9.0 8.8*  MG 2.3  --   --   PHOS 4.3  --   --    Liver Function Tests: No results for input(s): AST, ALT, ALKPHOS, BILITOT, PROT, ALBUMIN in the last 72 hours. No results for input(s): LIPASE, AMYLASE in the last 72 hours. CBC: No results for input(s): WBC, NEUTROABS, HGB, HCT, MCV, PLT in the last 72 hours. Cardiac Enzymes: No results for input(s): CKTOTAL, CKMB, CKMBINDEX, TROPONINI in the last 72 hours. BNP: Invalid input(s): POCBNP D-Dimer: No results for input(s): DDIMER in the last 72 hours. Hemoglobin A1C: No results for input(s): HGBA1C in the last 72 hours. Fasting Lipid Panel: No results for input(s): CHOL, HDL, LDLCALC, TRIG, CHOLHDL, LDLDIRECT in the last 72 hours. Thyroid Function Tests: No results for input(s): TSH, T4TOTAL, T3FREE, THYROIDAB in the last 72 hours.  Invalid input(s): FREET3 Anemia Panel: No results for input(s): VITAMINB12, FOLATE, FERRITIN, TIBC, IRON, RETICCTPCT in the last 72 hours.   PHYSICAL EXAM General: Increased WOB noted, otherwise alert and comfortable  HEENT:  Normocephalic and atramatic Neck:  No JVD.  Lungs: Wheezing  bilaterally throughout lung fields. Heart: HRRR . Normal S1 and S2 without gallops or murmurs.  Abdomen: Bowel sounds are positive, abdomen soft and non-tender  Msk:  Back normal, normal gait. Normal strength and tone for age. Extremities: No clubbing, cyanosis or edema.   Neuro: Alert and oriented X 3. Psych:  Good affect, responds appropriately  TELEMETRY: NSR 74bpm,   ASSESSMENT AND PLAN: Congestive heart failure due to diastolic dysfunction with concomitant bronchitic syndrome. Pulmonology is recommending continued bronchopulmonary hygiene and physical therapy to recruit atelectatic segment with bronchodilators q4h.  Pt has received 5 days of IV lasix, creatinine and electrolytes are stable C, weight is stable. Consider changing lasix to oral dosing lasix 40mg  BID.  Low concern for ACS as CCTA done 02/22/18 showed mild minor luminal irregularitieswith calcium score 99 and echocardiogram 02/18/18 had ejection fraction 60% with mild diastolic dysfunction and mild mitral regurgitation.   Active Problems:   Acute CHF (congestive heart failure) ([place])    Jake Bathe, NP-C 05/04/2018 9:24 AM Cell: [phone]

## 2018-05-04 NOTE — Progress Notes (Signed)
Echo still not done, had pericardial effusion on ct

## 2018-05-04 NOTE — Progress Notes (Signed)
SATURATION QUALIFICATIONS: (This note is used to comply with regulatory documentation for home oxygen)  Patient Saturations on Room Air at Rest = 86%  Patient Saturations on Room Air while Ambulating = 82%  Patient Saturations on 3 Liters of oxygen while Ambulating = 91%  Please briefly explain why patient needs home oxygen: Desats on room air.

## 2018-05-04 NOTE — Progress Notes (Signed)
[facility] [place] NAME: Lori Browning    MR#:  [id number]  DATE OF BIRTH:  [birth year]  SUBJECTIVE:  CHIEF COMPLAINT:   Chief Complaint  Patient presents with   Shortness of Breath   Cough   No new complaints.  Has some mild shortness of breath with intermittent cough.  No fevers.   Denies any sick contacts.  No recent travel.  Patient however still hypoxic on room air and requiring supplemental oxygen.  Seen by pulmonary with recommendations below  REVIEW OF SYSTEMS:  Review of Systems  Constitutional: Negative for chills and fever.  HENT: Negative for hearing loss and tinnitus.   Eyes: Negative for blurred vision and double vision.  Respiratory: Positive for shortness of breath. Negative for wheezing.   Cardiovascular: Negative for chest pain, palpitations and orthopnea.  Gastrointestinal: Negative for heartburn, nausea and vomiting.  Genitourinary: Negative for dysuria and urgency.  Musculoskeletal: Negative for myalgias and neck pain.  Skin: Negative for itching and rash.  Neurological: Negative for dizziness and headaches.  Psychiatric/Behavioral: Negative for depression and hallucinations.    DRUG ALLERGIES:   Allergies  Allergen Reactions   Sulfa Antibiotics Nausea And Vomiting and Rash   VITALS:  Blood pressure (!) 149/71, pulse 63, temperature 97.9 F (36.6 C), temperature source Oral, resp. rate 20, height 5\' 6"  (1.676 m), weight 106.8 kg, SpO2 94 %. PHYSICAL EXAMINATION:  Physical Exam  Constitutional: She is oriented to person, place, and time. She appears well-developed and well-nourished.  HENT:  Head: Normocephalic.  Right Ear: External ear normal.  Left Ear: External ear normal.  Eyes: Pupils are equal, round, and reactive to light. Conjunctivae are normal. Right eye exhibits no discharge.  Neck: Normal range of motion. Neck supple. No thyromegaly present.  Cardiovascular: Normal rate, regular rhythm  and normal heart sounds.  Respiratory: Effort normal. She has no wheezes. She has rales.  GI: Soft. Bowel sounds are normal. There is no abdominal tenderness.  Musculoskeletal: Normal range of motion.        General: Edema present.  Neurological: She is alert and oriented to person, place, and time.  Skin: Skin is warm and dry. She is not diaphoretic.  Psychiatric: She has a normal mood and affect. Her behavior is normal.   LABORATORY PANEL:  Female CBC Recent Labs  Lab 04/30/18 1210  WBC 14.9*  HGB 14.7  HCT 47.2*  PLT 170   ------------------------------------------------------------------------------------------------------------------ Chemistries  Recent Labs  Lab 04/30/18 1210  05/02/18 0417  05/04/18 0423  NA 136   < > 138   < > 136  K 4.8   < > 3.5   < > 3.6  CL 103   < > 100   < > 97*  CO2 25   < > 30   < > 29  GLUCOSE 172*   < > 167*   < > 168*  BUN 17   < > 33*   < > 56*  CREATININE 0.76   < > 0.83   < > 0.98  CALCIUM 8.8*   < > 8.8*   < > 8.8*  MG  --   --  2.3  --   --   AST 38  --   --   --   --   ALT 24  --   --   --   --   ALKPHOS 46  --   --   --   --  BILITOT 0.9  --   --   --   --    < > = values in this interval not displayed.   RADIOLOGY:  Ct Chest W Contrast  Result Date: 05/04/2018 CLINICAL DATA:  Shortness of breath. EXAM: CT CHEST WITH CONTRAST TECHNIQUE: Multidetector CT imaging of the chest was performed during intravenous contrast administration. CONTRAST:  57mL OMNIPAQUE IOHEXOL 300 MG/ML  SOLN COMPARISON:  04/30/2018 FINDINGS: Cardiovascular: The heart is normal in size. There is moderate in size pericardial effusion, which measures 17 mm at the base of the heart. Calcific atherosclerotic disease of the coronary arteries and aorta. Mediastinum/Nodes: No enlarged mediastinal, hilar, or axillary lymph nodes. Thyroid gland, trachea, and esophagus demonstrate no significant findings. Lungs/Pleura: Linear right upper lobe consolidation versus  atelectasis. Mild bronchiectasis and bronchial wall thickening. Minimal atelectasis in the left lower lobe Upper Abdomen: Numerous liver cysts. Partially visualized left renal cyst. No acute abnormalities. Musculoskeletal: No chest wall abnormality. No acute or significant osseous findings. IMPRESSION: 1. Moderate in size pericardial effusion may be responsible for patient's symptoms. 2. Linear right upper lobe consolidation versus atelectasis. 3. Mild bronchiectasis and bronchial wall thickening. 4. Calcific atherosclerotic disease of the coronary arteries and aorta. Electronically Signed   By: Fidela Salisbury M.D.   On: 05/04/2018 12:54   ASSESSMENT AND PLAN:    Patient is a 85 year old white female with history of hypertension presenting with complaint of shortness of breath  1.  Acute diastolic congestive heart failure exacerbation  Being diuresed with IV Lasix.   2D echocardiogram from 01/20 documented in cardiologist note with ejection fraction of 60% with grade 1 diastolic dysfunction.  Appreciate cardiologist input.  Cardiologist added hydralazine 25 mg p.o. every 8 hourly.  Lisinopril increased to 10 mg daily.   Monitor daily weights.    2.   Acute hypoxic respiratory failure Secondary to CHF exacerbation and recent acute bronchitis with cough with bronchospasm with wheezing Patient with oxygen saturation in the 80son room air. Diuresed well with IV Lasix.  Patient completed antibiotics with levofloxacin treatment recently.  Currently on IV steroids with gradual taper. Seen by pulmonologist.  Appreciate input.  No history of COPD or asthma. Pulmonologist believes hypoxia also related to atelectasis.  Initiated minimally invasive strategy with bronchopulmonary hygiene and physical therapy to recruit atelectatic segment.  Continue bronchodilators.  Chest physical therapy. CT chest with contrast requested today reviewed moderate sized pericardial effusion which is started not may be  responsible for patient's symptoms.  Linear right upper lobe consolidation versus atelectasis noted.  Mild bronchiectasis and bronchial wall thickening. I have requested for 2D echocardiogram for further evaluation of the pericardial effusion and sent a message to notify cardiologist Influenza test negative. No sick contacts.  No recent travel history.  No fevers  3.  Hypertensive urgency. Blood pressure better controlled on current regimen  4.  Hyperlipidemia Continue statins   DVT prophylaxis; Lovenox   All the records are reviewed and case discussed with Care Management/Social Worker. Management plans discussed with the patient, family and they are in agreement.  Patient seen by physical therapist.  Recommendation is for home health with physical therapy on discharge  CODE STATUS: DNR  TOTAL TIME TAKING CARE OF THIS PATIENT: 37 minutes.   More than 50% of the time was spent in counseling/coordination of care: YES  POSSIBLE D/C IN 1-2 DAYS, DEPENDING ON CLINICAL CONDITION.   Willoughby Doell M.D on 05/04/2018 at 2:03 PM  Between 7am to 6pm - Pager -  [phone]  After 6pm go to [email or web address] - [employer] [facility]  Office  [phone]  CC: Primary care physician; Kirk Ruths, MD  Note: This dictation was prepared with Dragon dictation along with smaller phrase technology. Any transcriptional errors that result from this process are unintentional.

## 2018-05-05 ENCOUNTER — Inpatient Hospital Stay
Admit: 2018-05-05 | Discharge: 2018-05-05 | Disposition: A | Discharge status: Home / Self Care | Payer: Medicare HMO | Attending: Internal Medicine | Admitting: Internal Medicine

## 2018-05-05 LAB — BASIC METABOLIC PANEL
Anion gap: 9 (ref 5–15)
BUN: 55 mg/dL — ABNORMAL HIGH (ref 8–23)
CHLORIDE: 96 mmol/L — AB (ref 98–111)
CO2: 32 mmol/L (ref 22–32)
Calcium: 8.9 mg/dL (ref 8.9–10.3)
Creatinine, Ser: 1.13 mg/dL — ABNORMAL HIGH (ref 0.44–1.00)
GFR calc Af Amer: 51 mL/min — ABNORMAL LOW (ref >60)
GFR calc non Af Amer: 44 mL/min — ABNORMAL LOW (ref >60)
Glucose, Bld: 166 mg/dL — ABNORMAL HIGH (ref 70–99)
Potassium: 3.9 mmol/L (ref 3.5–5.1)
Sodium: 137 mmol/L (ref 135–145)

## 2018-05-05 LAB — ECHOCARDIOGRAM COMPLETE
Height: 66 in
Weight: 3880.1 oz

## 2018-05-05 LAB — CULTURE, BLOOD (ROUTINE X 2)
Culture: NO GROWTH
Culture: NO GROWTH
Special Requests: ADEQUATE

## 2018-05-05 LAB — PHOSPHORUS: Phosphorus: 4.6 mg/dL (ref 2.5–4.6)

## 2018-05-05 LAB — MAGNESIUM: Magnesium: 2.5 mg/dL — ABNORMAL HIGH (ref 1.7–2.4)

## 2018-05-05 NOTE — Progress Notes (Signed)
[facility] NAME: Lori Browning    MR#:  [id number]  DATE OF BIRTH:  Aug 11, 1930  SUBJECTIVE:  CHIEF COMPLAINT:   Chief Complaint  Patient presents with  . Shortness of Breath  . Cough   No new complaints.  Has some mild shortness of breath with intermittent cough.  No fevers.   Denies any sick contacts.  No recent travel.  Patient however still hypoxic but improving on room air and requiring supplemental oxygen.   REVIEW OF SYSTEMS:  Review of Systems  Constitutional: Negative for chills and fever.  HENT: Negative for hearing loss and tinnitus.   Eyes: Negative for blurred vision and double vision.  Respiratory: Positive for shortness of breath. Negative for wheezing.   Cardiovascular: Negative for chest pain, palpitations and orthopnea.  Gastrointestinal: Negative for heartburn, nausea and vomiting.  Genitourinary: Negative for dysuria and urgency.  Musculoskeletal: Negative for myalgias and neck pain.  Skin: Negative for itching and rash.  Neurological: Negative for dizziness and headaches.  Psychiatric/Behavioral: Negative for depression and hallucinations.    DRUG ALLERGIES:   Allergies  Allergen Reactions  . Sulfa Antibiotics Nausea And Vomiting and Rash   VITALS:  Blood pressure (!) 115/57, pulse 62, temperature (!) 97.5 F (36.4 C), temperature source Oral, resp. rate 20, height 5\' 6"  (1.676 m), weight 110 kg, SpO2 95 %. PHYSICAL EXAMINATION:  Physical Exam  Constitutional: She is oriented to person, place, and time. She appears well-developed and well-nourished.  HENT:  Head: Normocephalic.  Right Ear: External ear normal.  Left Ear: External ear normal.  Eyes: Pupils are equal, round, and reactive to light. Conjunctivae are normal. Right eye exhibits no discharge.  Neck: Normal range of motion. Neck supple. No thyromegaly present.  Cardiovascular: Normal rate, regular rhythm and normal heart sounds.   Respiratory: Effort normal. She has no wheezes. She has rales.  GI: Soft. Bowel sounds are normal. There is no abdominal tenderness.  Musculoskeletal: Normal range of motion.        General: Edema present.  Neurological: She is alert and oriented to person, place, and time.  Skin: Skin is warm and dry. She is not diaphoretic.  Psychiatric: She has a normal mood and affect. Her behavior is normal.   LABORATORY PANEL:  Female CBC Recent Labs  Lab 04/30/18 1210  WBC 14.9*  HGB 14.7  HCT 47.2*  PLT 170   ------------------------------------------------------------------------------------------------------------------ Chemistries  Recent Labs  Lab 04/30/18 1210  05/05/18 0421  NA 136   < > 137  K 4.8   < > 3.9  CL 103   < > 96*  CO2 25   < > 32  GLUCOSE 172*   < > 166*  BUN 17   < > 55*  CREATININE 0.76   < > 1.13*  CALCIUM 8.8*   < > 8.9  MG  --    < > 2.5*  AST 38  --   --   ALT 24  --   --   ALKPHOS 46  --   --   BILITOT 0.9  --   --    < > = values in this interval not displayed.   RADIOLOGY:  No results found. ASSESSMENT AND PLAN:    Patient is a 85 year old white female with history of hypertension presenting with complaint of shortness of breath  1.  Acute diastolic congestive heart failure exacerbation  Being diuresed with IV Lasix.  2D echocardiogram from 01/20 documented in cardiologist note with ejection fraction of 60% with grade 1 diastolic dysfunction.  Appreciate cardiologist input.  Cardiologist added hydralazine 25 mg p.o. every 8 hourly.  Lisinopril increased to 10 mg daily.   Monitor daily weights.    2.   Acute hypoxic respiratory failure Secondary to CHF exacerbation and recent acute bronchitis with cough with bronchospasm with wheezing Patient with oxygen saturation in the 80son room air. Diuresed well with IV Lasix.  Patient completed antibiotics with levofloxacin treatment recently.  Currently on IV steroids with gradual taper. Seen by  pulmonologist.  Appreciate input.  No history of COPD or asthma. Pulmonologist believes hypoxia also related to atelectasis.  Initiated minimally invasive strategy with bronchopulmonary hygiene and physical therapy to recruit atelectatic segment.  Continue bronchodilators.  Chest physical therapy. CT chest with contrast done revealed moderate sized pericardial effusion which is started not may be responsible for patient's symptoms.  Linear right upper lobe consolidation versus atelectasis noted.  Mild bronchiectasis and bronchial wall thickening. 2D echocardiogram done with ejection fraction of 60 to 65%.  Small pericardial effusion. Influenza test negative. No sick contacts.  No recent travel history.  No fevers  3.  Hypertensive urgency. Blood pressure better controlled on current regimen  4.  Hyperlipidemia Continue statins   DVT prophylaxis; Lovenox   All the records are reviewed and case discussed with Care Management/Social Worker. Management plans discussed with the patient, family and they are in agreement.  Patient seen by physical therapist.  Recommendation is for home health with physical therapy on discharge  CODE STATUS: DNR  TOTAL TIME TAKING CARE OF THIS PATIENT: 34 minutes.   More than 50% of the time was spent in counseling/coordination of care: YES  POSSIBLE D/C IN 1-2 DAYS, DEPENDING ON CLINICAL CONDITION.   [name] M.D on 05/05/2018 at 3:36 PM  Between 7am to 6pm - Pager - [phone]  After 6pm go to [email or web address] - [employer] [facility]  Office  [phone]  CC: Primary care physician; Kirk Ruths, MD  Note: This dictation was prepared with Dragon dictation along with smaller phrase technology. Any transcriptional errors that result from this process are unintentional.

## 2018-05-05 NOTE — Progress Notes (Signed)
Pulmonary Medicine          Date: 05/05/2018,   MRN# [id number] Lori Browning 1930-12-29     AdmissionWeight: 109.4 kg                 CurrentWeight: 110 kg         SUBJECTIVE    Patient reports clinical improvement.  S/p aggressive bronchopulmonary hygiene.  S/p CT chest - reviewed.    PAST MEDICAL HISTORY   Past Medical History:  Diagnosis Date  . Hyperlipemia   . Hypertension   . Urge incontinence      SURGICAL HISTORY   Past Surgical History:  Procedure Laterality Date  . BREAST CYST EXCISION    . CATARACT EXTRACTION    . hip replacment    . REPLACEMENT TOTAL KNEE       FAMILY HISTORY   Family History  Problem Relation Age of Onset  . Hypertension Mother      SOCIAL HISTORY   Social History   Tobacco Use  . Smoking status: Never Smoker  . Smokeless tobacco: Never Used  Substance Use Topics  . Alcohol use: Never    Frequency: Never  . Drug use: Never     MEDICATIONS    Home Medication:    Current Medication:  Current Facility-Administered Medications:  .  0.9 %  sodium chloride infusion, 250 mL, Intravenous, PRN, Dustin Flock, MD .  acetaminophen (TYLENOL) tablet 650 mg, 650 mg, Oral, Q4H PRN, Dustin Flock, MD, 650 mg at 04/30/18 2104 .  aspirin EC tablet 81 mg, 81 mg, Oral, Daily, Dustin Flock, MD .  carvedilol (COREG) tablet 3.125 mg, 3.125 mg, Oral, BID WC, Jake Bathe, FNP, 3.125 mg at 05/05/18 0853 .  enoxaparin (LOVENOX) injection 40 mg, 40 mg, Subcutaneous, Q24H, Dustin Flock, MD, 40 mg at 05/04/18 2205 .  furosemide (LASIX) injection 40 mg, 40 mg, Intravenous, BID, Dustin Flock, MD, 40 mg at 05/05/18 0851 .  guaiFENesin-dextromethorphan (ROBITUSSIN DM) 100-10 MG/5ML syrup 5 mL, 5 mL, Oral, Q4H PRN, Dustin Flock, MD, 5 mL at 05/04/18 2214 .  hydrALAZINE (APRESOLINE) tablet 50 mg, 50 mg, Oral, Q8H, Jake Bathe, FNP, 50 mg at 05/05/18 0557 .  ipratropium-albuterol (DUONEB) 0.5-2.5  (3) MG/3ML nebulizer solution 3 mL, 3 mL, Nebulization, Q4H PRN, Dustin Flock, MD, 3 mL at 05/03/18 0606 .  isosorbide mononitrate (IMDUR) 24 hr tablet 30 mg, 30 mg, Oral, Daily, Dustin Flock, MD, 30 mg at 05/05/18 0853 .  lisinopril (PRINIVIL,ZESTRIL) tablet 10 mg, 10 mg, Oral, Daily, Neoma Laming A, MD, 10 mg at 05/05/18 [phone] .  methylPREDNISolone sodium succinate (SOLU-MEDROL) 40 mg/mL injection 40 mg, 40 mg, Intravenous, Q12H, Ojie, Jude, MD, 40 mg at 05/05/18 0557 .  ondansetron (ZOFRAN) injection 4 mg, 4 mg, Intravenous, Q6H PRN, Dustin Flock, MD .  polyethylene glycol (MIRALAX / GLYCOLAX) packet 17 g, 17 g, Oral, Daily PRN, Stark Jock, Jude, MD, 17 g at 05/03/18 1723 .  pravastatin (PRAVACHOL) tablet 40 mg, 40 mg, Oral, Daily, Ojie, Jude, MD, 40 mg at 05/05/18 0852 .  sodium chloride flush (NS) 0.9 % injection 10 mL, 10 mL, Intravenous, Q12H, Ojie, Jude, MD, 10 mL at 05/03/18 2248 .  sodium chloride flush (NS) 0.9 % injection 3 mL, 3 mL, Intravenous, Q12H, Dustin Flock, MD, 3 mL at 05/05/18 0858 .  sodium chloride flush (NS) 0.9 % injection 3 mL, 3 mL, Intravenous, PRN, Dustin Flock, MD .  spironolactone (ALDACTONE) tablet 25 mg, 25 mg,  Oral, Daily, Neoma Laming A, MD, 25 mg at 05/05/18 9983    ALLERGIES   Sulfa antibiotics     REVIEW OF SYSTEMS    Review of Systems:  Gen:  Denies  fever, sweats, chills weigh loss  HEENT: Denies blurred vision, double vision, ear pain, eye pain, hearing loss, nose bleeds, sore throat Cardiac:  No dizziness, chest pain or heaviness, chest tightness,edema Resp:   Denies cough, dyspnea Gi: Denies swallowing difficulty, stomach pain, nausea or vomiting, diarrhea, constipation, bowel incontinence Gu:  Denies bladder incontinence, burning urine Ext:   Denies Joint pain, stiffness or swelling Skin: Denies  skin rash, easy bruising or bleeding or hives Endoc:  Denies polyuria, polydipsia , polyphagia or weight change Psych:   Denies  depression, insomnia or hallucinations   Other:  All other systems negative   VS: BP (!) 145/64 (BP Location: Right Arm)   Pulse 68   Temp (!) 97.5 F (36.4 C) (Oral)   Resp 20   Ht 5\' 6"  (1.676 m)   Wt 110 kg   SpO2 97%   BMI 39.14 kg/m      PHYSICAL EXAM    GENERAL:NAD, no fevers, chills, no weakness no fatigue HEAD: Normocephalic, atraumatic.  EYES: Pupils equal, round, reactive to light. Extraocular muscles intact. No scleral icterus.  MOUTH: Moist mucosal membrane. Dentition intact. No abscess noted.  EAR, NOSE, THROAT: Clear without exudates. No external lesions.  NECK: Supple. No thyromegaly. No nodules. No JVD.  PULMONARY: clear to auscultation with decreased breath sounds.  CARDIOVASCULAR: S1 and S2. Regular rate and rhythm. No murmurs, rubs, or gallops. No edema. Pedal pulses 2+ bilaterally.  GASTROINTESTINAL: Soft, nontender, nondistended. No masses. Positive bowel sounds. No hepatosplenomegaly.  MUSCULOSKELETAL: No swelling, clubbing, or edema. Range of motion full in all extremities.  NEUROLOGIC: Cranial nerves II through XII are intact. No gross focal neurological deficits. Sensation intact. Reflexes intact.  SKIN: No ulceration, lesions, rashes, or cyanosis. Skin warm and dry. Turgor intact.  PSYCHIATRIC: Mood, affect within normal limits. The patient is awake, alert and oriented x 3. Insight, judgment intact.       IMAGING    Ct Chest W Contrast  Result Date: 05/04/2018 CLINICAL DATA:  Shortness of breath. EXAM: CT CHEST WITH CONTRAST TECHNIQUE: Multidetector CT imaging of the chest was performed during intravenous contrast administration. CONTRAST:  45mL OMNIPAQUE IOHEXOL 300 MG/ML  SOLN COMPARISON:  04/30/2018 FINDINGS: Cardiovascular: The heart is normal in size. There is moderate in size pericardial effusion, which measures 17 mm at the base of the heart. Calcific atherosclerotic disease of the coronary arteries and aorta. Mediastinum/Nodes: No enlarged  mediastinal, hilar, or axillary lymph nodes. Thyroid gland, trachea, and esophagus demonstrate no significant findings. Lungs/Pleura: Linear right upper lobe consolidation versus atelectasis. Mild bronchiectasis and bronchial wall thickening. Minimal atelectasis in the left lower lobe Upper Abdomen: Numerous liver cysts. Partially visualized left renal cyst. No acute abnormalities. Musculoskeletal: No chest wall abnormality. No acute or significant osseous findings. IMPRESSION: 1. Moderate in size pericardial effusion may be responsible for patient's symptoms. 2. Linear right upper lobe consolidation versus atelectasis. 3. Mild bronchiectasis and bronchial wall thickening. 4. Calcific atherosclerotic disease of the coronary arteries and aorta. Electronically Signed   By: Fidela Salisbury M.D.   On: 05/04/2018 12:54   Dg Chest Port 1 View  Result Date: 04/30/2018 CLINICAL DATA:  Hypoxia and dyspnea today. The patient was diagnosed with bronchitis/pneumonia 04/28/2018. EXAM: PORTABLE CHEST 1 VIEW COMPARISON:  CT chest 02/01/2015. FINDINGS:  There is cardiomegaly and vascular congestion. No consolidative process, pneumothorax or pleural effusion. No acute or focal bony abnormality. IMPRESSION: Cardiomegaly and pulmonary vascular congestion. Electronically Signed   By: Inge Rise M.D.   On: 04/30/2018 12:27      ASSESSMENT/PLAN    Acute hypoxemic respiratory failure     -s/p CT chest     - currently admitted with acute exacerbation of HFpEF     -likely recently had viral URI with resultant exacerbation of underlying cylindrical bronchiectasis as evidenced by CT chest.  Also incidentally found pericardial effusion- appreciate cardio input - unlikely culprit of symptoms     - would favor continuing incentive spirometry and acapella to help clear mucus and combat atelectasis but this is unnecessary to be done inpatient and can be continued at home.      - patient may require O2 therapy for short  term (based on exertional walk around hallway) may d/c home with close pulmonary follow up.  We have set up appointment already.        Thank you for allowing me to participate in the care of this patient.   Patient/Family are satisfied with care plan and all questions have been answered.  This document was prepared using Dragon voice recognition software and may include unintentional dictation errors.     Ottie Glazier, M.D.  Division of [place]

## 2018-05-05 NOTE — Progress Notes (Signed)
Physical Therapy Treatment Patient Details Name: Lori Browning MRN: [id number] DOB: 12/26/1930 Today's Date: 05/05/2018    History of Present Illness Patient is 85 yo female presented to ED due to SOB, workup showed acute diastolic CHF. PMH of HLD, HTN, incontinence.     PT Comments    Patient found in chair at beginning of the sessions. Patient demosntrates ability to ambulate >565ft with RW and min guard from PT, stopped once secondary to O2 <90% which after 30 sec of pursed lip breathing returned to 94%. Educated patient on importance of pursed lip breathing with ambulation. Patient will benefit from further skilled therapy to further improve limitations such as difficulties with sit<>stand, amb and bed mobility. Recommend Home Health PT upon discharge.    Follow Up Recommendations  Home health PT     Equipment Recommendations  None recommended by PT    Recommendations for Other Services       Precautions / Restrictions Precautions Precautions: Fall Precaution Comments: watch O2 Restrictions Weight Bearing Restrictions: No    Mobility  Bed Mobility               General bed mobility comments: Deferred in chair  Transfers Overall transfer level: Needs assistance Equipment used: Rolling walker (2 wheeled) Transfers: Sit to/from Stand Sit to Stand: Min guard         General transfer comment: B use of UE  Ambulation/Gait Ambulation/Gait assistance: Min guard Gait Distance (Feet): 540 Feet Assistive device: Rolling walker (2 wheeled) Gait Pattern/deviations: WFL(Within Functional Limits)     General Gait Details: decreased   Stairs             Wheelchair Mobility    Modified Rankin (Stroke Patients Only)       Balance Overall balance assessment: Needs assistance   Sitting balance-Leahy Scale: Good       Standing balance-Leahy Scale: Fair Standing balance comment: Use of RW                            Cognition  Arousal/Alertness: Awake/alert Behavior During Therapy: WFL for tasks assessed/performed Overall Cognitive Status: Within Functional Limits for tasks assessed                                        Exercises      General Comments        Pertinent Vitals/Pain Pain Assessment: No/denies pain    Home Living                      Prior Function            PT Goals (current goals can now be found in the care plan section) Acute Rehab PT Goals Patient Stated Goal: to go home PT Goal Formulation: With patient Time For Goal Achievement: 05/18/18 Potential to Achieve Goals: Good Progress towards PT goals: Progressing toward goals    Frequency    Min 2X/week      PT Plan Current plan remains appropriate    Co-evaluation              AM-PAC PT "6 Clicks" Mobility   Outcome Measure  Help needed turning from your back to your side while in a flat bed without using bedrails?: A Little Help needed moving from lying on your back to sitting on the  side of a flat bed without using bedrails?: A Little Help needed moving to and from a bed to a chair (including a wheelchair)?: A Little Help needed standing up from a chair using your arms (e.g., wheelchair or bedside chair)?: A Little Help needed to walk in hospital room?: A Little Help needed climbing 3-5 steps with a railing? : A Little 6 Click Score: 18    End of Session Equipment Utilized During Treatment: Gait belt;Oxygen Activity Tolerance: Patient tolerated treatment well Patient left: in chair;with chair alarm set;with call bell/phone within reach Nurse Communication: Mobility status PT Visit Diagnosis: Other symptoms and signs involving the nervous system (R29.898);Muscle weakness (generalized) (M62.81);Difficulty in walking, not elsewhere classified (R26.2)     Time: 4132-4401 PT Time Calculation (min) (ACUTE ONLY): 23 min  Charges:  $Gait Training: 23-37 mins                     Blythe Stanford, PT DPT 05/05/18, 4:30 PM

## 2018-05-05 NOTE — Progress Notes (Signed)
*  PRELIMINARY RESULTS* Echocardiogram 2D Echocardiogram has been performed.  Sherrie Sport 05/05/2018, 8:14 AM

## 2018-05-05 NOTE — Progress Notes (Signed)
SUBJECTIVE: Remains short of breath, but symptoms are gradually improving.    Vitals:   05/04/18 1924 05/05/18 0501 05/05/18 0727 05/05/18 0853  BP: 120/62 (!) 145/65 (!) 145/64   Pulse: 65 (!) 58 63 68  Resp: 20 20 20    Temp: 97.7 F (36.5 C) 97.6 F (36.4 C) (!) 97.5 F (36.4 C)   TempSrc: Oral Oral Oral   SpO2: 95% 93% 97%   Weight:  110 kg    Height:        Intake/Output Summary (Last 24 hours) at 05/05/2018 0901 Last data filed at 05/05/2018 0500 Gross per 24 hour  Intake 3 ml  Output 700 ml  Net -697 ml    LABS: Basic Metabolic Panel: Recent Labs    05/04/18 0423 05/05/18 0421  NA 136 137  K 3.6 3.9  CL 97* 96*  CO2 29 32  GLUCOSE 168* 166*  BUN 56* 55*  CREATININE 0.98 1.13*  CALCIUM 8.8* 8.9  MG  --  2.5*  PHOS  --  4.6   Liver Function Tests: No results for input(s): AST, ALT, ALKPHOS, BILITOT, PROT, ALBUMIN in the last 72 hours. No results for input(s): LIPASE, AMYLASE in the last 72 hours. CBC: No results for input(s): WBC, NEUTROABS, HGB, HCT, MCV, PLT in the last 72 hours. Cardiac Enzymes: No results for input(s): CKTOTAL, CKMB, CKMBINDEX, TROPONINI in the last 72 hours. BNP: Invalid input(s): POCBNP D-Dimer: No results for input(s): DDIMER in the last 72 hours. Hemoglobin A1C: No results for input(s): HGBA1C in the last 72 hours. Fasting Lipid Panel: No results for input(s): CHOL, HDL, LDLCALC, TRIG, CHOLHDL, LDLDIRECT in the last 72 hours. Thyroid Function Tests: No results for input(s): TSH, T4TOTAL, T3FREE, THYROIDAB in the last 72 hours.  Invalid input(s): FREET3 Anemia Panel: No results for input(s): VITAMINB12, FOLATE, FERRITIN, TIBC, IRON, RETICCTPCT in the last 72 hours.   PHYSICAL EXAM General: Weak, increased WOB noted.  HEENT:  Normocephalic and atramatic Neck:  No JVD.  Lungs: Wheezing bilaterally.  Heart: HRRR . Normal S1 and S2 without gallops or murmurs.  Abdomen: Bowel sounds are positive, abdomen soft and non-tender   Msk:  Back normal, normal gait. Normal strength and tone for age. Extremities: No clubbing, cyanosis or edema.   Neuro: Alert and oriented X 3. Psych:  Good affect, responds appropriately  TELEMETRY: NSR 67bpm  ASSESSMENT AND PLAN:  Congestive heart failure due to diastolic dysfunction with concomitant bronchitic syndrome. Chest CT done yesterday showing moderate sized pericardial effusion, however the follow up echo done  this morning showed small pericardial effusion, unlikely to be causing any of her symptoms. Continue incentive spirometry and consider changing lasix to oral dosing.   Active Problems:   Acute CHF (congestive heart failure) ([place])    Jake Bathe, NP-C 05/05/2018 9:01 AM Cell: [phone]

## 2018-05-05 NOTE — TOC Progression Note (Signed)
Transition of Care [facility]) - Progression Note    Patient Details  Name: Lori Browning MRN: [id number] Date of Birth: 05-23-1930  [facility]) CM/SW Contact  Elza Rafter, RN Phone Number: 05/05/2018, 1:18 PM  Clinical Narrative:   Patient has qualified for oxygen.  Referral to [facility] with [facility].     Expected Discharge Plan: Home/Self Care Barriers to Discharge: Continued Medical Work up  Expected Discharge Plan and Services Expected Discharge Plan: Home/Self Care       Living arrangements for the past 2 months: Single Family Home                 DME Arranged: Oxygen DME Agency: [employer]       Social Determinants of Health (SDOH) Interventions    Readmission Risk Interventions No flowsheet data found.

## 2018-05-05 NOTE — TOC Progression Note (Signed)
Transition of Care [facility]) - Progression Note    Patient Details  Name: Lori Browning MRN: [id number] Date of Birth: January 30, 1931  Transition of Care [facility]) CM/SW Contact  Elza Rafter, RN Phone Number: 05/05/2018, 8:52 AM  Clinical Narrative:   Tiajuana Amass to discharge-Patient continues to require 2L acute oxygen; continues to receive 40mg  lasix IV BID.     Expected Discharge Plan: Home/Self Care Barriers to Discharge: Continued Medical Work up  Expected Discharge Plan and Services Expected Discharge Plan: Home/Self Care       Living arrangements for the past 2 months: Single Family Home                           Social Determinants of Health (SDOH) Interventions    Readmission Risk Interventions No flowsheet data found.

## 2018-05-06 LAB — CBC
HCT: 43.5 % (ref 36.0–46.0)
HEMOGLOBIN: 14.2 g/dL (ref 12.0–15.0)
MCH: 28.9 pg (ref 26.0–34.0)
MCHC: 32.6 g/dL (ref 30.0–36.0)
MCV: 88.6 fL (ref 80.0–100.0)
Platelets: 213 10*3/uL (ref 150–400)
RBC: 4.91 MIL/uL (ref 3.87–5.11)
RDW: 14.2 % (ref 11.5–15.5)
WBC: 14.1 10*3/uL — ABNORMAL HIGH (ref 4.0–10.5)
nRBC: 0 % (ref 0.0–0.2)

## 2018-05-06 MED ORDER — FUROSEMIDE 40 MG PO TABS: 40.0000 mg | ORAL_TABLET | Freq: Every day | ORAL | 0 refills | Status: DC

## 2018-05-06 MED ORDER — ISOSORBIDE MONONITRATE ER 30 MG PO TB24: 30.0000 mg | ORAL_TABLET | Freq: Every day | ORAL | 0 refills | Status: DC

## 2018-05-06 MED ORDER — LISINOPRIL 10 MG PO TABS: 10.0000 mg | ORAL_TABLET | Freq: Every day | ORAL | 0 refills | Status: DC

## 2018-05-06 MED ORDER — GUAIFENESIN-DM 100-10 MG/5ML PO SYRP: 5.0000 mL | ORAL_SOLUTION | ORAL | 0 refills | Status: AC | PRN

## 2018-05-06 MED ORDER — FUROSEMIDE 40 MG PO TABS: 40.0000 mg | ORAL_TABLET | Freq: Every day | ORAL | Status: DC

## 2018-05-06 MED ORDER — HYDRALAZINE HCL 50 MG PO TABS: 50.0000 mg | ORAL_TABLET | Freq: Three times a day (TID) | ORAL | 0 refills | Status: DC

## 2018-05-06 MED ORDER — PREDNISONE 10 MG PO TABS: ORAL_TABLET | ORAL | 0 refills | Status: DC

## 2018-05-06 MED ORDER — SPIRONOLACTONE 25 MG PO TABS: 25.0000 mg | ORAL_TABLET | Freq: Every day | ORAL | 0 refills | Status: DC

## 2018-05-06 NOTE — Progress Notes (Signed)
  Pt desaturates on room air with mobility and requires supplemental oxygen to maintain a safe saturation level.  SpO2 on room air at rest = 91% SpO2 on room air while ambulating = 85% SpO2 on 2 liters of O2 while ambulating = 89%   10:55 AM, 05/06/18 Etta Grandchild, PT, DPT Mobility Tech  Physical Therapist - [facility]  [phone] [facility])

## 2018-05-06 NOTE — TOC Transition Note (Signed)
Transition of Care [facility]) - CM/SW Discharge Note   Patient Details  Name: Lori Browning MRN: [id number] Date of Birth: October 04, 1930  Transition of Care [facility]) CM/SW Contact:  Elza Rafter, RN Phone Number: 05/06/2018, 2:00 PM   Clinical Narrative:   Patient discharging to home today with home health RN/ReDS Vest with [facility].  Oxygen delivered to room.      Final next level of care: Home/Self Care Barriers to Discharge: No Barriers Identified   Patient Goals and CMS Choice Patient states their goals for this hospitalization and ongoing recovery are:: Discharge with Home Health RN CMS Medicare.gov Compare Post Acute Care list provided to:: Patient Choice offered to / list presented to : Patient  Discharge Placement                       Discharge Plan and Services                DME Arranged: Oxygen DME Agency: [employer]       Social Determinants of Health (SDOH) Interventions     Readmission Risk Interventions No flowsheet data found.

## 2018-05-06 NOTE — Progress Notes (Signed)
SATURATION QUALIFICATIONS: (This note is used to comply with regulatory documentation for home oxygen)  Patient Saturations on Room Air at Rest = 91%  Patient Saturations on Room Air while Ambulating = 85%  Patient Saturations on 2 Liters of oxygen while Ambulating = 89%  Please briefly explain why patient needs home oxygen: Needs 3L to be above 90%.

## 2018-05-06 NOTE — Progress Notes (Signed)
SUBJECTIVE: Shortness of breath is improving, she is feeling much better and ready to go home.    Vitals:   05/05/18 1925 05/05/18 2138 05/06/18 0408 05/06/18 0754  BP: (!) 125/59 (!) 130/58 (!) 154/66 (!) 167/68  Pulse: 66 61 (!) 59 62  Resp: 20 18 18 20   Temp: (!) 97.5 F (36.4 C) 98 F (36.7 C) 97.6 F (36.4 C) 98.7 F (37.1 C)  TempSrc: Oral Oral Oral Oral  SpO2: 96% 95% 95% 95%  Weight:   106.4 kg   Height:        Intake/Output Summary (Last 24 hours) at 05/06/2018 1046 Last data filed at 05/06/2018 0700 Gross per 24 hour  Intake -  Output 1700 ml  Net -1700 ml    LABS: Basic Metabolic Panel: Recent Labs    05/04/18 0423 05/05/18 0421  NA 136 137  K 3.6 3.9  CL 97* 96*  CO2 29 32  GLUCOSE 168* 166*  BUN 56* 55*  CREATININE 0.98 1.13*  CALCIUM 8.8* 8.9  MG  --  2.5*  PHOS  --  4.6   Liver Function Tests: No results for input(s): AST, ALT, ALKPHOS, BILITOT, PROT, ALBUMIN in the last 72 hours. No results for input(s): LIPASE, AMYLASE in the last 72 hours. CBC: Recent Labs    05/06/18 0423  WBC 14.1*  HGB 14.2  HCT 43.5  MCV 88.6  PLT 213   Cardiac Enzymes: No results for input(s): CKTOTAL, CKMB, CKMBINDEX, TROPONINI in the last 72 hours. BNP: Invalid input(s): POCBNP D-Dimer: No results for input(s): DDIMER in the last 72 hours. Hemoglobin A1C: No results for input(s): HGBA1C in the last 72 hours. Fasting Lipid Panel: No results for input(s): CHOL, HDL, LDLCALC, TRIG, CHOLHDL, LDLDIRECT in the last 72 hours. Thyroid Function Tests: No results for input(s): TSH, T4TOTAL, T3FREE, THYROIDAB in the last 72 hours.  Invalid input(s): FREET3 Anemia Panel: No results for input(s): VITAMINB12, FOLATE, FERRITIN, TIBC, IRON, RETICCTPCT in the last 72 hours.   PHYSICAL EXAM General: Pale, slightly increased WOB, otherwise comfortable and in no acute distress HEENT:  Normocephalic and atramatic Neck:  No JVD.  Lungs: Clear bilaterally to auscultation  and percussion. Heart: HRRR . Normal S1 and S2 without gallops or murmurs.  Abdomen: Bowel sounds are positive, abdomen soft and non-tender  Msk:  Back normal, normal gait. Normal strength and tone for age. Extremities: No clubbing, cyanosis or edema.   Neuro: Alert and oriented X 3. Psych:  Good affect, responds appropriately  TELEMETRY: NSR 70bpm  ASSESSMENT AND PLAN: Congestive heart failure due to diastolic dysfunctionwithconcomitant bronchitic syndrome. Chest CT showed moderate sized pericardial effusion, however the follow up echo showed small pericardial effusion, unlikely to be causing her dyspnea. May discharge home from cardiac perspective with oral lasix, spironolactone, lisinopril, and hydralazine. Follow up virtual visit next week with Dr. Humphrey Rolls Wednesday 4/1 11am.   Active Problems:   Acute CHF (congestive heart failure) ([place])    Jake Bathe, NP-C 05/06/2018 10:46 AM Cell: [phone]

## 2018-05-06 NOTE — Care Management Important Message (Signed)
Important Message  Patient Details  Name: Lori Browning MRN: [id number] Date of Birth: 1930/08/31   Medicare Important Message Given:  Yes    Juliann Pulse A Arlo Butt 05/06/2018, 12:43 PM

## 2018-05-06 NOTE — Discharge Summary (Signed)
[facility] [place] NAME: Lori Browning    MR#:  [id number]  DATE OF BIRTH:  [birth year]  DATE OF ADMISSION:  04/30/2018   ADMITTING PHYSICIAN: Dustin Flock, MD  DATE OF DISCHARGE: 05/06/2018  PRIMARY CARE PHYSICIAN: Kirk Ruths, MD   ADMISSION DIAGNOSIS:  Hypoxia [V61.60] Acute systolic congestive heart failure ([place]) [I50.21] DISCHARGE DIAGNOSIS:  Active Problems:   Acute CHF (congestive heart failure) ([place])  SECONDARY DIAGNOSIS:   Past Medical History:  Diagnosis Date  . Hyperlipemia   . Hypertension   . Urge incontinence    HOSPITAL COURSE:   Chief complaint; cough and shortness of breath   HISTORY OF PRESENT ILLNESS: Lori Browning  is a 85 y.o. female with a known history of hyperlipidemia, hypertension and urge incontinence who is presenting to the hospital with shortness of breath and cough.  Patient states that she was recently seen by her primary care provider and was treated for bronchitis with steroids and antibiotics.  Patient became very short of breath and hypoxic with oxygen in the 70s.  Initially had to require BiPAP now off BiPAP.  Patient states that she lives by herself and has not traveled outside of the city.  He has not had any visitors.  He denies any fevers or chills.  His complaint of cough.  States that she was seen by cardiology recently and underwent a stress test, echo, and and likely cardiac CT. please refer to the H&P dictated for further details.  Hospital course;  1.Acute diastolic congestive heart failure exacerbation Patient adequately diuresed with IV Lasix.  Significantly improved clinically.  Being discharged on p.o. Lasix. Being diuresed with IV Lasix.    Continue Coreg and lisinopril. 2D echocardiogram from 01/20 documented in cardiologist note with ejection fraction of 60% with grade 1 diastolic dysfunction.  Appreciate cardiologist input.  Cardiologist added hydralazine 25 mg p.o.  every 8 hourly.  Clinically and hemodynamically stable.  Follow-up with cardiologist as outpatient.  2.  Acute hypoxic respiratory failure Secondary to CHF exacerbation and recent acute bronchitis with cough with bronchospasm with wheezing Patient with oxygen saturation in the 80son room air. Diuresed well with IV Lasix.  Patient completed antibiotics with levofloxacin treatment recently.  Currently on IV steroids with gradual taper.  Transition to p.o. prednisone taper on discharge.Seen by pulmonologist.  Appreciate input.  No history of COPD or asthma. Pulmonologist believes hypoxia also related to atelectasis.  Initiated minimally invasive strategy with bronchopulmonary hygiene and physical therapy to recruit atelectatic segment.  Continue bronchodilators.  Chest physical therapy.CT chest with contrast done revealed moderate sized pericardial effusion which is started not may be responsible for patient's symptoms.  Linear right upper lobe consolidation versus atelectasis noted.  Mild bronchiectasis and bronchial wall thickening.2D echocardiogram done with ejection fraction of 60 to 65%.  Small pericardial effusion. Influenza test negative. No sick contacts.  No recent travel history.  No fevers Home oxygen therapy evaluation done and patient will require home oxygen therapy via nasal cannula at 2 L at home.  Case manager already set up.  Case manager also setting up home health with physical therapy has recommended by physical therapist.  3.  Hypertensive urgency. Blood pressure better controlled on current regimen  4.Hyperlipidemia Continue statins    DISCHARGE CONDITIONS:  Patient clinically and hemodynamically stable for discharge.  Follow-up with cardiologist and pulmonologist as outpatient. CONSULTS OBTAINED:  Treatment Team:  Dionisio David, MD Otila Back, MD Ottie Glazier,  MD DRUG ALLERGIES:   Allergies  Allergen Reactions  . Sulfa Antibiotics Nausea And Vomiting and  Rash   DISCHARGE MEDICATIONS:   Allergies as of 05/06/2018      Reactions   Sulfa Antibiotics Nausea And Vomiting, Rash      Medication List    STOP taking these medications   carvedilol 25 MG tablet Commonly known as:  COREG   levofloxacin 500 MG tablet Commonly known as:  LEVAQUIN     TAKE these medications   furosemide 40 MG tablet Commonly known as:  LASIX Take 1 tablet (40 mg total) by mouth daily. Start taking on:  May 07, 2018   guaiFENesin-dextromethorphan 100-10 MG/5ML syrup Commonly known as:  ROBITUSSIN DM Take 5 mLs by mouth every 4 (four) hours as needed for up to 5 days for cough.   hydrALAZINE 50 MG tablet Commonly known as:  APRESOLINE Take 1 tablet (50 mg total) by mouth every 8 (eight) hours for 30 days.   isosorbide mononitrate 30 MG 24 hr tablet Commonly known as:  IMDUR Take 1 tablet (30 mg total) by mouth daily. Start taking on:  May 07, 2018   lisinopril 10 MG tablet Commonly known as:  PRINIVIL,ZESTRIL Take 1 tablet (10 mg total) by mouth daily. Start taking on:  May 07, 2018   pravastatin 40 MG tablet Commonly known as:  PRAVACHOL Take 40 mg by mouth daily.   predniSONE 10 MG tablet Commonly known as:  DELTASONE 40 mg p.o. daily x2 days Then 30 mg p.o. daily x2 days Then 20 mg p.o. daily x2 days Then 10 mg p.o. daily x2 days What changed:  See the new instructions.   spironolactone 25 MG tablet Commonly known as:  ALDACTONE Take 1 tablet (25 mg total) by mouth daily. Start taking on:  May 07, 2018            Durable Medical Equipment  (From admission, onward)         Start     Ordered   05/06/18 0000  For home use only DME oxygen    Comments:  Diagnosis ;congestive heart failure.  Question Answer Comment  Mode or (Route) Nasal cannula   Liters per Minute 2   Frequency Continuous (stationary and portable oxygen unit needed)   Oxygen conserving device Yes   Oxygen delivery system Gas      05/06/18 1305            DISCHARGE INSTRUCTIONS:   DIET:  Cardiac diet DISCHARGE CONDITION:  Stable ACTIVITY:  Activity as tolerated OXYGEN:  Home Oxygen: No.  Oxygen Delivery: Oxygen at 2 L continuously DISCHARGE LOCATION:  home   If you experience worsening of your admission symptoms, develop shortness of breath, life threatening emergency, suicidal or homicidal thoughts you must seek medical attention immediately by calling 911 or calling your MD immediately  if symptoms less severe.  You Must read complete instructions/literature along with all the possible adverse reactions/side effects for all the Medicines you take and that have been prescribed to you. Take any new Medicines after you have completely understood and accpet all the possible adverse reactions/side effects.   Please note  You were cared for by a hospitalist during your hospital stay. If you have any questions about your discharge medications or the care you received while you were in the hospital after you are discharged, you can call the unit and asked to speak with the hospitalist on call if the hospitalist that took  care of you is not available. Once you are discharged, your primary care physician will handle any further medical issues. Please note that NO REFILLS for any discharge medications will be authorized once you are discharged, as it is imperative that you return to your primary care physician (or establish a relationship with a primary care physician if you do not have one) for your aftercare needs so that they can reassess your need for medications and monitor your lab values.    On the day of Discharge:  VITAL SIGNS:  Blood pressure (!) 167/68, pulse 62, temperature 98.7 F (37.1 C), temperature source Oral, resp. rate 20, height 5\' 6"  (1.676 m), weight 106.4 kg, SpO2 93 %. PHYSICAL EXAMINATION:  GENERAL:  85 y.o.-year-old patient lying in the bed with no acute distress.  EYES: Pupils equal, round, reactive to light  and accommodation. No scleral icterus. Extraocular muscles intact.  HEENT: Head atraumatic, normocephalic. Oropharynx and nasopharynx clear.  NECK:  Supple, no jugular venous distention. No thyroid enlargement, no tenderness.  LUNGS: Normal breath sounds bilaterally, no wheezing, rales,rhonchi or crepitation. No use of accessory muscles of respiration.  CARDIOVASCULAR: S1, S2 normal. No murmurs, rubs, or gallops.  ABDOMEN: Soft, non-tender, non-distended. Bowel sounds present. No organomegaly or mass.  EXTREMITIES: No pedal edema, cyanosis, or clubbing.  NEUROLOGIC: Cranial nerves II through XII are intact. Muscle strength 5/5 in all extremities. Sensation intact. Gait not checked.  PSYCHIATRIC: The patient is alert and oriented x 3.  SKIN: No obvious rash, lesion, or ulcer.  DATA REVIEW:   CBC Recent Labs  Lab 05/06/18 0423  WBC 14.1*  HGB 14.2  HCT 43.5  PLT 213    Chemistries  Recent Labs  Lab 04/30/18 1210  05/05/18 0421  NA 136   < > 137  K 4.8   < > 3.9  CL 103   < > 96*  CO2 25   < > 32  GLUCOSE 172*   < > 166*  BUN 17   < > 55*  CREATININE 0.76   < > 1.13*  CALCIUM 8.8*   < > 8.9  MG  --    < > 2.5*  AST 38  --   --   ALT 24  --   --   ALKPHOS 46  --   --   BILITOT 0.9  --   --    < > = values in this interval not displayed.     Microbiology Results  Results for orders placed or performed during the hospital encounter of 04/30/18  Blood culture (routine x 2)     Status: None   Collection Time: 04/30/18 12:11 PM  Result Value Ref Range Status   Specimen Description BLOOD BLOOD LEFT WRIST  Final   Special Requests   Final    BOTTLES DRAWN AEROBIC AND ANAEROBIC Blood Culture results may not be optimal due to an inadequate volume of blood received in culture bottles   Culture   Final    NO GROWTH 5 DAYS Performed at [facility], [address] [place], [place] [address]    Report Status 05/05/2018 FINAL  Final  Blood culture (routine x 2)      Status: None   Collection Time: 04/30/18 12:11 PM  Result Value Ref Range Status   Specimen Description BLOOD BLOOD LEFT HAND  Final   Special Requests   Final    BOTTLES DRAWN AEROBIC AND ANAEROBIC Blood Culture adequate volume   Culture  Final    NO GROWTH 5 DAYS Performed at [facility], [address] [place], [place] [address]    Report Status 05/05/2018 FINAL  Final    RADIOLOGY:  No results found.   Management plans discussed with the patient, family and they are in agreement.  CODE STATUS: DNR   TOTAL TIME TAKING CARE OF THIS PATIENT: 38 minutes.    Debara Kamphuis M.D on 05/06/2018 at 2:45 PM  Between 7am to 6pm - Pager - [phone]  After 6pm go to [email or web address] - [employer] [facility]  Office  [phone]  CC: Primary care physician; Kirk Ruths, MD   Note: This dictation was prepared with Dragon dictation along with smaller phrase technology. Any transcriptional errors that result from this process are unintentional.

## 2018-05-06 NOTE — Progress Notes (Signed)
IV and tele removed from patient. Discharge instructions given to patient along with hard copy prescriptions. Oxygen delivered. Patient instructed on usage. Awaiting ride from family. No acute distress at this time.

## 2018-05-09 ENCOUNTER — Ambulatory Visit: Payer: Medicare Other | Admitting: Family

## 2018-05-12 DIAGNOSIS — I5032 Chronic diastolic (congestive) heart failure: Secondary | ICD-10-CM | POA: Insufficient documentation

## 2018-05-16 ENCOUNTER — Telehealth: Payer: Self-pay

## 2018-05-16 NOTE — Telephone Encounter (Signed)
TELEPHONE CALL NOTE  Lori Browning has been deemed a candidate for a follow-up tele-health visit to limit community exposure during the Covid-19 pandemic. I spoke with the patient via phone to ensure availability of phone/video source, confirm preferred email & phone number, discuss instructions and expectations, and review consent.   I reminded [name] to be prepared with any vital sign and/or heart rhythm information that could potentially be obtained via home monitoring, at the time of her visit.  Finally, I reminded [name] to expect an e-mail containing a link for their video-based visit approximately 15 minutes before her visit, or alternatively, a phone call at the time of her visit if her visit is planned to be a phone encounter.  Did the patient verbally consent to treatment as below? YES  [name], CMA 05/16/2018 10:38 AM  CONSENT FOR TELE-HEALTH VISIT - PLEASE REVIEW  I hereby voluntarily request, consent and authorize [facility] and its employed or contracted physicians, physician assistants, nurse practitioners or other licensed health care professionals (the Practitioner), to provide me with telemedicine health care services (the "Services") as deemed necessary by the treating Practitioner. I acknowledge and consent to receive the Services by the Practitioner via telemedicine. I understand that the telemedicine visit will involve communicating with the Practitioner through telephonic communication technology and the disclosure of certain medical information by electronic transmission. I acknowledge that I have been given the opportunity to request an in-person assessment or other available alternative prior to the telemedicine visit and am voluntarily participating in the telemedicine visit.  I understand that I have the right to withhold or withdraw my consent to the use of telemedicine in the course of my care at any time, without affecting my  right to future care or treatment, and that the Practitioner or I may terminate the telemedicine visit at any time. I understand that I have the right to inspect all information obtained and/or recorded in the course of the telemedicine visit and may receive copies of available information for a reasonable fee.  I understand that some of the potential risks of receiving the Services via telemedicine include:  [name] Delay or interruption in medical evaluation due to technological equipment failure or disruption; . Information transmitted may not be sufficient (e.g. poor resolution of images) to allow for appropriate medical decision making by the Practitioner; and/or  . In rare instances, security protocols could fail, causing a breach of personal health information.  Furthermore, I acknowledge that it is my responsibility to provide information about my medical history, conditions and care that is complete and accurate to the best of my ability. I acknowledge that Practitioner's advice, recommendations, and/or decision may be based on factors not within their control, such as incomplete or inaccurate data provided by me or lack of visual representation. I understand that the practice of medicine is not an exact science and that Practitioner makes no warranties or guarantees regarding treatment outcomes. I acknowledge that I will receive a copy of this consent concurrently upon execution via email to the email address I last provided but may also request a printed copy by calling the office of [facility].    I understand that my insurance may be billed for this visit.   I have read or had this consent read to me. . I understand the contents of this consent, which adequately explains the benefits and risks of the Services being provided via telemedicine.  [name]  I have been provided ample opportunity to ask questions regarding this consent and the Services and have had my questions answered to my  satisfaction. . I give my informed consent for the services to be provided through the use of telemedicine in my medical care  By participating in this telemedicine visit I agree to the above.

## 2018-05-16 NOTE — Telephone Encounter (Signed)
   TELEPHONE CALL NOTE  This patient has been deemed a candidate for follow-up tele-health visit to limit community exposure during the Covid-19 pandemic. I spoke with the patient via phone to discuss instructions. The patient was advised to review the section on consent for treatment as well. The patient will receive a phone call 2-3 days prior to their E-Visit at which time consent will be verbally confirmed. A Virtual Office Visit appointment type has been scheduled for 05/17/2018 with Darylene Price FNP.  [name], CMA 05/16/2018 10:37 AM

## 2018-05-17 ENCOUNTER — Ambulatory Visit: Payer: Medicare HMO | Attending: Family | Admitting: Family

## 2018-05-17 ENCOUNTER — Other Ambulatory Visit: Payer: Self-pay

## 2018-05-17 ENCOUNTER — Encounter: Payer: Self-pay | Admitting: Family

## 2018-05-17 VITALS — Wt 225.4 lb

## 2018-05-17 DIAGNOSIS — I5032 Chronic diastolic (congestive) heart failure: Secondary | ICD-10-CM

## 2018-05-17 NOTE — Patient Instructions (Signed)
Continue weighing daily and call for an overnight weight gain of > 2 pounds or a weekly weight gain of >5 pounds. 

## 2018-05-17 NOTE — Progress Notes (Signed)
Virtual Visit via Telephone Note   This visit type was conducted due to national recommendations for restrictions regarding the COVID-19 Pandemic (e.g. social distancing) in an effort to limit this patient's exposure and mitigate transmission in our community.  Due to her co-morbid illnesses, this patient is at least at moderate risk for complications without adequate follow up.  This format is felt to be most appropriate for this patient at this time.    Evaluation Performed:  Initial visit  This visit type was conducted due to national recommendations for restrictions regarding the COVID-19 Pandemic (e.g. social distancing).  This format is felt to be most appropriate for this patient at this time.  All issues noted in this document were discussed and addressed.  No physical exam was performed (except for noted visual exam findings with Video Visits).  Please refer to the patient's chart (MyChart message for video visits and phone note for telephone visits) for the patient's consent to telehealth for [facility]  Date:  05/17/2018   ID:  Arnoldo Morale, DOB [birth year], MRN [id number]  Patient Location:  [address] [place] [address]   Provider location:   [facility] [place] [address], [place] [address]  PCP:  Kirk Ruths, MD  Cardiologist:  Isaias Cowman, MD Electrophysiologist:  None   Chief Complaint: Shortness of breath  History of Present Illness:    Lori Browning is a 84 y.o. female who presents via audio/video conferencing for a telehealth visit today.  Patient verified DOB and address.  The patient does not have symptoms concerning for COVID-19 infection (fever, chills, cough, or new SHORTNESS OF BREATH).   Patient reports having moderate shortness of breath upon minimal exertion. She describes this as chronic in nature having been present for several weeks. She has associated fatigue and productive cough  along with this. Her cough has been present for ~ 1 1/2 weeks and she feels like it's improving. She denies any difficulty sleeping, fever, sore throat, chest pain, palpitations, pedal edema, abdominal distention, dizziness or weight gain. She says that her weight is now at 225 pounds.   Prior CV studies:   The following studies were reviewed today:  Echo report from 05/05/2018 reviewed and showed an EF of 60-65%.  Past Medical History:  Diagnosis Date  . Hyperlipemia   . Hypertension   . Urge incontinence    Past Surgical History:  Procedure Laterality Date  . BREAST CYST EXCISION    . CATARACT EXTRACTION    . hip replacment    . REPLACEMENT TOTAL KNEE       Current Meds  Medication Sig  . carvedilol (COREG) 25 MG tablet Take 25 mg by mouth 2 (two) times daily with a meal.  . Cranberry 250 MG TABS Take 1 tablet by mouth daily.  . furosemide (LASIX) 40 MG tablet Take 1 tablet (40 mg total) by mouth daily.  . pravastatin (PRAVACHOL) 40 MG tablet Take 40 mg by mouth daily.  [name] spironolactone (ALDACTONE) 25 MG tablet Take 1 tablet (25 mg total) by mouth daily.     Allergies:   Sulfa antibiotics   Social History   Tobacco Use  . Smoking status: Never Smoker  . Smokeless tobacco: Never Used  Substance Use Topics  . Alcohol use: Never    Frequency: Never  . Drug use: Never     Family Hx: The patient's family history includes Hypertension in her mother.  ROS:  Please see the history of present illness.     All other systems reviewed and are negative.   Labs/Other Tests and Data Reviewed:    Recent Labs: 04/30/2018: ALT 24; B Natriuretic Peptide 635.0; TSH 1.297 05/05/2018: BUN 55; Creatinine, Ser 1.13; Magnesium 2.5; Potassium 3.9; Sodium 137 05/06/2018: Hemoglobin 14.2; Platelets 213   Recent Lipid Panel No results found for: CHOL, TRIG, HDL, CHOLHDL, LDLCALC, LDLDIRECT  Wt Readings from Last 3 Encounters:  05/17/18 225 lb 6 oz (102.2 kg)  05/06/18 234 lb 8 oz  (106.4 kg)     Exam:    Vital Signs:  Wt 225 lb 6 oz (102.2 kg) Comment: self-reported  BMI 36.38 kg/m    Well nourished, well developed female in no  acute distress.   ASSESSMENT & PLAN:    1. Chronic heart failure with preserved ejection fraction- - NYHA class III - euvolemic per patient's description - weighing daily and says that her weight has been stable. Instructed to call for an overnight weight gain of >2 pounds or a weekly weight gain of >5 pounds - does add salt to the food that she cooks but doesn't add it afterwards. Reviewed the importance of not adding any salt to her food and to read food labels for sodium content so that she can closely follow a 2000mg  sodium diet.   - saw cardiology (Paraschos) 10/20/17 - BNP 04/30/2018 was 635.0  2: HTN- - encouraged her to start checking her BP at home a few times a week - saw PCP Ouida Sills) 05/12/2018 - BMP 05/05/2018 reviewed and showed sodium 137, potassium 3.9, creatinine 1.13 and GFR 44  COVID-19 Education: The signs and symptoms of COVID-19 were discussed with the patient and how to seek care for testing (follow up with PCP or arrange E-visit).  The importance of social distancing was discussed today.  Patient Risk:   After full review of this patients clinical status, I feel that they are at least moderate risk at this time.  Time:   Today, I have spent 18 minutes with the patient with telehealth technology discussing heart failure, medications, daily weights and symptoms to call about.    Medication Adjustments/Labs and Tests Ordered: Current medicines are reviewed at length with the patient today.  Concerns regarding medicines are outlined above.   Tests Ordered: No orders of the defined types were placed in this encounter.  Medication Changes: No orders of the defined types were placed in this encounter.   Disposition:  Follow-up in 1 month or sooner for any questions/problems before then.   Signed, Alisa Graff, FNP  05/17/2018 11:59 AM    [facility]

## 2018-06-15 ENCOUNTER — Ambulatory Visit: Payer: Medicare HMO | Attending: Family | Admitting: Family

## 2018-06-15 ENCOUNTER — Encounter: Payer: Self-pay | Admitting: Family

## 2018-06-15 ENCOUNTER — Other Ambulatory Visit: Payer: Self-pay

## 2018-06-15 VITALS — BP 126/82 | Wt 226.5 lb

## 2018-06-15 DIAGNOSIS — I10 Essential (primary) hypertension: Secondary | ICD-10-CM

## 2018-06-15 DIAGNOSIS — I5032 Chronic diastolic (congestive) heart failure: Secondary | ICD-10-CM

## 2018-06-15 NOTE — Progress Notes (Signed)
Virtual Visit via Telephone Note    Evaluation Performed:  Follow-up visit  This visit type was conducted due to national recommendations for restrictions regarding the COVID-19 Pandemic (e.g. social distancing).  This format is felt to be most appropriate for this patient at this time.  All issues noted in this document were discussed and addressed.  No physical exam was performed (except for noted visual exam findings with Video Visits).  Please refer to the patient's chart (MyChart message for video visits and phone note for telephone visits) for the patient's consent to telehealth for [facility]  Date:  06/15/2018   ID:  Arnoldo Morale, DOB December 02, 1930, MRN [id number]  Patient Location:  [address] [place] [address]   Provider location:   [facility] [place] [address], [place] [address]  PCP:  Kirk Ruths, MD  Cardiologist:  Isaias Cowman, MD Electrophysiologist:  None   Chief Complaint:  Shortness of breath  History of Present Illness:    Lori Browning is a 85 y.o. female who presents via audio/video conferencing for a telehealth visit today.  Patient verified DOB and address.  The patient does not have symptoms concerning for COVID-19 infection (fever, chills, cough, or new SHORTNESS OF BREATH).   Patient reports minimal shortness of breath upon moderate exertion. She describes this as chronic in nature having been present for several years. She has associated fatigue along with this. She denies any dizziness, swelling in legs/ abdomen, palpitations, chest pain, difficulty sleeping or weight gain. Currently receiving OT/PT on a weekly basis.   Prior CV studies:   The following studies were reviewed today:  Echo report from 05/05/2018 reviewed and showed an EF of 60-65%.   Past Medical History:  Diagnosis Date  . Hyperlipemia   . Hypertension   . Urge incontinence    Past Surgical History:  Procedure  Laterality Date  . BREAST CYST EXCISION    . CATARACT EXTRACTION    . hip replacment    . REPLACEMENT TOTAL KNEE       Current Meds  Medication Sig  . carvedilol (COREG) 25 MG tablet Take 25 mg by mouth 2 (two) times daily with a meal.  . Cranberry 500 MG CAPS Take 1 tablet by mouth daily.   . furosemide (LASIX) 40 MG tablet Take 1 tablet (40 mg total) by mouth daily.  . pravastatin (PRAVACHOL) 40 MG tablet Take 40 mg by mouth daily.  [name] spironolactone (ALDACTONE) 25 MG tablet Take 1 tablet (25 mg total) by mouth daily.     Allergies:   Sulfa antibiotics   Social History   Tobacco Use  . Smoking status: Never Smoker  . Smokeless tobacco: Never Used  Substance Use Topics  . Alcohol use: Never    Frequency: Never  . Drug use: Never     Family Hx: The patient's family history includes Hypertension in her mother.  ROS:   Please see the history of present illness.     All other systems reviewed and are negative.   Labs/Other Tests and Data Reviewed:    Recent Labs: 04/30/2018: ALT 24; B Natriuretic Peptide 635.0; TSH 1.297 05/05/2018: BUN 55; Creatinine, Ser 1.13; Magnesium 2.5; Potassium 3.9; Sodium 137 05/06/2018: Hemoglobin 14.2; Platelets 213   Recent Lipid Panel No results found for: CHOL, TRIG, HDL, CHOLHDL, LDLCALC, LDLDIRECT  Wt Readings from Last 3 Encounters:  06/15/18 226 lb 8 oz (102.7 kg)  05/17/18 225 lb  6 oz (102.2 kg)  05/06/18 234 lb 8 oz (106.4 kg)     Exam:    Vital Signs:  BP 126/82 Comment: self-reported  Wt 226 lb 8 oz (102.7 kg) Comment: self-reported  BMI 36.56 kg/m    Well nourished, well developed female in no  acute distress.   ASSESSMENT & PLAN:    1. Chronic heart failure with preserved ejection fraction- - NYHA class II - euvolemic per patient's description of symptoms - weighing daily and says that her weight has ranged from 225-228 pounds. Reminded to call for an overnight weight gain of >2 pounds or a weekly weight gain of  >5 pounds - has not been adding salt to her foods and is trying to closely follow a low sodium diet - saw cardiology (Paraschos) 10/20/17 - currently receiving OT/PT services at home on a weekly basis - BNP 04/30/2018 was 635.0  2: HTN- - self-reported BP at home today was good at 126/82 - saw PCP Ouida Sills) 05/26/2018 - BMP 05/05/2018 reviewed and showed sodium 137, potassium 3.9, creatinine 1.13 and GFR 44  COVID-19 Education: The signs and symptoms of COVID-19 were discussed with the patient and how to seek care for testing (follow up with PCP or arrange E-visit).  The importance of social distancing was discussed today.  Patient Risk:   After full review of this patients clinical status, I feel that they are at least moderate risk at this time.  Time:   Today, I have spent 8 minutes with the patient with telehealth technology discussing medications, weight and symptoms to report.     Medication Adjustments/Labs and Tests Ordered: Current medicines are reviewed at length with the patient today.  Concerns regarding medicines are outlined above.   Tests Ordered: No orders of the defined types were placed in this encounter.  Medication Changes: No orders of the defined types were placed in this encounter.   Disposition:  Follow-up in 2 months or sooner for any questions/problems before then.   Signed, Alisa Graff, FNP  06/15/2018 11:35 AM    [facility]

## 2018-06-15 NOTE — Patient Instructions (Signed)
Continue weighing daily and call for an overnight weight gain of > 2 pounds or a weekly weight gain of >5 pounds. 

## 2018-08-26 NOTE — Progress Notes (Signed)
Patient ID: Lori Browning, female    DOB: 1930-09-15, 85 y.o.   MRN: [id number]  HPI  Lori Browning is a 85 y/o female with a history of hyperlipidemia, HTN, COPD and chronic heart failure.   Echo report from 05/05/2018 reviewed and showed and EF of 60-65%  Admitted 04/30/2018 due to acute HF exacerbation. Pulmonology and cardiology consults obtained. Initially needed IV lasix and then transitioned to oral diuretics. Was on bipap initially but then was weaned off of it. Discharged after 6 days.   She presents today for a follow-up visit with a chief complaint of moderate shortness of breath upon minimal exertion. She describes this as chronic in nature having been present for several years. She has associated fatigue along with this. She denies any dizziness, difficulty sleeping, abdominal distention, palpitations, pedal edema, chest pain or weight gain. Has had a fairly recent COPD diagnosis and is now using a nebulizer.   Past Medical History:  Diagnosis Date  . CHF (congestive heart failure) ([place])   . COPD (chronic obstructive pulmonary disease) ([place])   . Hyperlipemia   . Hypertension   . Urge incontinence    Past Surgical History:  Procedure Laterality Date  . BREAST CYST EXCISION    . CATARACT EXTRACTION    . hip replacment    . REPLACEMENT TOTAL KNEE     Family History  Problem Relation Age of Onset  . Hypertension Mother    Social History   Tobacco Use  . Smoking status: Never Smoker  . Smokeless tobacco: Never Used  Substance Use Topics  . Alcohol use: Never    Frequency: Never   Allergies  Allergen Reactions  . Sulfa Antibiotics Nausea And Vomiting and Rash   Prior to Admission medications   Medication Sig Start Date End Date Taking? Authorizing Provider  albuterol (PROVENTIL) (2.5 MG/3ML) 0.083% nebulizer solution Take 2.5 mg by nebulization every 6 (six) hours as needed for wheezing or shortness of breath.   Yes [provider]  carvedilol (COREG) 25 MG  tablet Take 25 mg by mouth 2 (two) times daily with a meal.   Yes [provider]  Cranberry 500 MG CAPS Take 1 tablet by mouth daily.    Yes [provider]  furosemide (LASIX) 40 MG tablet Take 1 tablet (40 mg total) by mouth daily. 05/07/18  Yes Ojie, Jude, MD  pravastatin (PRAVACHOL) 40 MG tablet Take 40 mg by mouth daily. 10/09/11  Yes [provider]  spironolactone (ALDACTONE) 25 MG tablet Take 1 tablet (25 mg total) by mouth daily. 05/07/18  Yes Ojie, Jude, MD    Review of Systems  Constitutional: Positive for fatigue (tire easily). Negative for appetite change.  HENT: Negative for congestion, postnasal drip and sore throat.   Eyes: Negative.   Respiratory: Positive for shortness of breath (easily). Negative for wheezing.   Cardiovascular: Negative for chest pain, palpitations and leg swelling.  Gastrointestinal: Negative for abdominal distention and abdominal pain.  Endocrine: Negative.   Genitourinary: Negative.   Musculoskeletal: Negative for back pain and neck pain.  Skin: Negative.   Allergic/Immunologic: Negative.   Neurological: Negative for dizziness and light-headedness.  Hematological: Negative for adenopathy. Does not bruise/bleed easily.  Psychiatric/Behavioral: Negative for dysphoric mood and sleep disturbance. The patient is not nervous/anxious.    Vitals:   08/29/18 1318  BP: 114/69  Pulse: 67  Resp: 18  SpO2: 96%  Weight: 225 lb 8 oz (102.3 kg)  Height: 5' 4.5" (1.638 m)  Wt Readings from Last 3 Encounters:  08/29/18 225 lb 8 oz (102.3 kg)  06/15/18 226 lb 8 oz (102.7 kg)  05/17/18 225 lb 6 oz (102.2 kg)   Lab Results  Component Value Date   CREATININE 1.13 (H) 05/05/2018   CREATININE 0.98 05/04/2018   CREATININE 0.95 05/03/2018    Physical Exam Vitals signs and nursing note reviewed.  Constitutional:      Appearance: She is well-developed.  HENT:     Head: Normocephalic and atraumatic.  Neck:     Musculoskeletal:  Normal range of motion and neck supple.     Vascular: No JVD.  Cardiovascular:     Rate and Rhythm: Normal rate and regular rhythm.  Pulmonary:     Effort: Pulmonary effort is normal. No respiratory distress.     Breath sounds: No wheezing or rales.  Abdominal:     Palpations: Abdomen is soft.     Tenderness: There is no abdominal tenderness.  Musculoskeletal:     Right lower leg: She exhibits no tenderness. No edema.     Left lower leg: She exhibits no tenderness. No edema.  Skin:    General: Skin is warm and dry.  Neurological:     General: No focal deficit present.     Mental Status: She is alert and oriented to person, place, and time.  Psychiatric:        Mood and Affect: Mood normal.        Behavior: Behavior normal.    Assessment & Plan:  1. Chronic heart failure with preserved ejection fraction- - NYHA class II - euvolemic today - weighing daily and says that her weight has been stable. Reminded to call for an overnight weight gain of >2 pounds or a weekly weight gain of >5 pounds - has not been adding salt to her foods and is trying to closely follow a low sodium diet - saw cardiology (Paraschos) 10/20/17 - BNP 04/30/2018 was 635.0  2: HTN- - BP looks good today - saw PCP Ouida Sills) 07/05/2018 - BMP 06/28/2018 reviewed and showed sodium 140, potassium 3.8, creatinine 0.9 and GFR 60  3: COPD - saw pulmonology [name]) 08/25/2018 - using nebulizer three times/ day for now  Patient did not bring her medications nor a list. Each medication was verbally reviewed with the patient and she was encouraged to bring the bottles to every visit to confirm accuracy of list.  Return in 6 months or sooner for any questions/problems before then.

## 2018-08-29 ENCOUNTER — Other Ambulatory Visit: Payer: Self-pay

## 2018-08-29 ENCOUNTER — Encounter: Payer: Self-pay | Admitting: Family

## 2018-08-29 ENCOUNTER — Ambulatory Visit: Payer: Medicare HMO | Attending: Family | Admitting: Family

## 2018-08-29 VITALS — BP 114/69 | HR 67 | Resp 18 | Ht 64.5 in | Wt 225.5 lb

## 2018-08-29 DIAGNOSIS — Z8249 Family history of ischemic heart disease and other diseases of the circulatory system: Secondary | ICD-10-CM | POA: Diagnosis not present

## 2018-08-29 DIAGNOSIS — J449 Chronic obstructive pulmonary disease, unspecified: Secondary | ICD-10-CM | POA: Insufficient documentation

## 2018-08-29 DIAGNOSIS — E785 Hyperlipidemia, unspecified: Secondary | ICD-10-CM | POA: Diagnosis not present

## 2018-08-29 DIAGNOSIS — I5032 Chronic diastolic (congestive) heart failure: Secondary | ICD-10-CM | POA: Diagnosis present

## 2018-08-29 DIAGNOSIS — I110 Hypertensive heart disease with heart failure: Secondary | ICD-10-CM | POA: Diagnosis not present

## 2018-08-29 DIAGNOSIS — Z79899 Other long term (current) drug therapy: Secondary | ICD-10-CM | POA: Insufficient documentation

## 2018-08-29 DIAGNOSIS — Z882 Allergy status to sulfonamides status: Secondary | ICD-10-CM | POA: Diagnosis not present

## 2018-08-29 DIAGNOSIS — J42 Unspecified chronic bronchitis: Secondary | ICD-10-CM

## 2018-08-29 DIAGNOSIS — I10 Essential (primary) hypertension: Secondary | ICD-10-CM

## 2018-08-29 NOTE — Patient Instructions (Signed)
Continue weighing daily and call for an overnight weight gain of > 2 pounds or a weekly weight gain of >5 pounds. 

## 2019-01-09 DIAGNOSIS — E1122 Type 2 diabetes mellitus with diabetic chronic kidney disease: Secondary | ICD-10-CM | POA: Insufficient documentation

## 2019-01-09 DIAGNOSIS — N1831 Chronic kidney disease, stage 3a: Secondary | ICD-10-CM | POA: Insufficient documentation

## 2019-02-27 ENCOUNTER — Ambulatory Visit: Payer: Medicare HMO | Admitting: Family

## 2019-03-01 NOTE — Progress Notes (Signed)
Patient ID: Lori Browning, female    DOB: [birth year], 86 y.o.   MRN: [id number]  HPI  Lori Browning is a 86 y/o female with a history of hyperlipidemia, HTN, COPD and chronic heart failure.   Echo report from 05/05/2018 reviewed and showed and EF of 60-65%  Has not been admitted or been in the ED in the last 6 months   She presents today for a follow-up visit with a chief complaint of moderate shortness of breath upon minimal exertion. She describes this as chronic in nature having been present for several years. She has associated fatigue, cough, wheezing and slight weight gain along with this. She denies any difficulty sleeping, dizziness, abdominal distention, palpitations, pedal edema, chest pain or head congestion.   She is currently trying to get an appointment to get her first COVID vaccine administered.   Past Medical History:  Diagnosis Date  . CHF (congestive heart failure) ([place])   . COPD (chronic obstructive pulmonary disease) ([place])   . Hyperlipemia   . Hypertension   . Urge incontinence    Past Surgical History:  Procedure Laterality Date  . BREAST CYST EXCISION    . CATARACT EXTRACTION    . hip replacment    . REPLACEMENT TOTAL KNEE     Family History  Problem Relation Age of Onset  . Hypertension Mother    Social History   Tobacco Use  . Smoking status: Never Smoker  . Smokeless tobacco: Never Used  Substance Use Topics  . Alcohol use: Never   Allergies  Allergen Reactions  . Sulfa Antibiotics Nausea And Vomiting and Rash   Prior to Admission medications   Medication Sig Start Date End Date Taking? Authorizing Provider  albuterol (PROVENTIL) (2.5 MG/3ML) 0.083% nebulizer solution Take 2.5 mg by nebulization every 6 (six) hours as needed for wheezing or shortness of breath.   Yes [provider]  carvedilol (COREG) 25 MG tablet Take 25 mg by mouth 2 (two) times daily with a meal.   Yes [provider]  Cranberry 500 MG CAPS Take 1 tablet by  mouth daily.    Yes [provider]  furosemide (LASIX) 40 MG tablet Take 1 tablet (40 mg total) by mouth daily. 05/07/18  Yes Ojie, Jude, MD  pravastatin (PRAVACHOL) 40 MG tablet Take 40 mg by mouth daily. 10/09/11  Yes [provider]  spironolactone (ALDACTONE) 25 MG tablet Take 1 tablet (25 mg total) by mouth daily. 05/07/18  Yes Ojie, Jude, MD    Review of Systems  Constitutional: Positive for fatigue (tire easily). Negative for appetite change.  HENT: Negative for congestion, postnasal drip and sore throat.   Eyes: Negative.   Respiratory: Positive for cough, shortness of breath (easily) and wheezing.   Cardiovascular: Negative for chest pain, palpitations and leg swelling.  Gastrointestinal: Negative for abdominal distention and abdominal pain.  Endocrine: Negative.   Genitourinary: Negative.   Musculoskeletal: Negative for back pain and neck pain.  Skin: Negative.   Allergic/Immunologic: Negative.   Neurological: Negative for dizziness and light-headedness.  Hematological: Negative for adenopathy. Does not bruise/bleed easily.  Psychiatric/Behavioral: Negative for dysphoric mood and sleep disturbance. The patient is not nervous/anxious.    Vitals:   03/02/19 1020  BP: (!) 130/59  Pulse: 72  Resp: 18  SpO2: 92%  Weight: 233 lb 12.8 oz (106.1 kg)  Height: 5\' 6"  (1.676 m)   Wt Readings from Last 3 Encounters:  03/02/19 233 lb 12.8 oz (106.1 kg)  08/29/18 225 lb 8 oz (102.3 kg)  06/15/18 226 lb 8 oz (102.7 kg)   Lab Results  Component Value Date   CREATININE 1.13 (H) 05/05/2018   CREATININE 0.98 05/04/2018   CREATININE 0.95 05/03/2018    Physical Exam Vitals and nursing note reviewed.  Constitutional:      Appearance: She is well-developed.  HENT:     Head: Normocephalic and atraumatic.  Neck:     Vascular: No JVD.  Cardiovascular:     Rate and Rhythm: Normal rate and regular rhythm.  Pulmonary:     Effort: Pulmonary effort is normal. No  respiratory distress.     Breath sounds: Examination of the right-upper field reveals decreased breath sounds. Examination of the left-upper field reveals decreased breath sounds. Decreased breath sounds present. No wheezing or rales.  Abdominal:     Palpations: Abdomen is soft.     Tenderness: There is no abdominal tenderness.  Musculoskeletal:     Cervical back: Normal range of motion and neck supple.     Right lower leg: No tenderness. No edema.     Left lower leg: No tenderness. No edema.  Skin:    General: Skin is warm and dry.  Neurological:     General: No focal deficit present.     Mental Status: She is alert and oriented to person, place, and time.  Psychiatric:        Mood and Affect: Mood normal.        Behavior: Behavior normal.    Assessment & Plan:  1. Chronic heart failure with preserved ejection fraction- - NYHA class III - euvolemic today - weighing daily and says that her weight has been stable. Reminded to call for an overnight weight gain of >2 pounds or a weekly weight gain of >5 pounds - weight up 8 pounds from last visit here 6 months ago - has not been adding salt to her foods and is trying to closely follow a low sodium diet - saw cardiology (Paraschos) 11/01/2018 - BNP 04/30/2018 was 635.0 - reports receiving her flu vaccine for this season  2: HTN- - BP looks good today - saw PCP Ouida Sills) 01/09/2019 - BMP 01/02/2019 reviewed and showed sodium 140, potassium 4.6, creatinine 1.0 and GFR 53  3: COPD - saw pulmonology [name]) 10/27/2018 - using nebulizer three times/ day   Patient did not bring her medications nor a list. Each medication was verbally reviewed with the patient and she was encouraged to bring the bottles to every visit to confirm accuracy of list.

## 2019-03-02 ENCOUNTER — Other Ambulatory Visit: Payer: Self-pay

## 2019-03-02 ENCOUNTER — Encounter: Payer: Self-pay | Admitting: Family

## 2019-03-02 ENCOUNTER — Ambulatory Visit: Payer: Medicare HMO | Attending: Family | Admitting: Family

## 2019-03-02 VITALS — BP 130/59 | HR 72 | Resp 18 | Ht 66.0 in | Wt 233.8 lb

## 2019-03-02 DIAGNOSIS — R0602 Shortness of breath: Secondary | ICD-10-CM | POA: Insufficient documentation

## 2019-03-02 DIAGNOSIS — E785 Hyperlipidemia, unspecified: Secondary | ICD-10-CM | POA: Diagnosis not present

## 2019-03-02 DIAGNOSIS — J42 Unspecified chronic bronchitis: Secondary | ICD-10-CM

## 2019-03-02 DIAGNOSIS — Z8249 Family history of ischemic heart disease and other diseases of the circulatory system: Secondary | ICD-10-CM | POA: Insufficient documentation

## 2019-03-02 DIAGNOSIS — I10 Essential (primary) hypertension: Secondary | ICD-10-CM

## 2019-03-02 DIAGNOSIS — J449 Chronic obstructive pulmonary disease, unspecified: Secondary | ICD-10-CM | POA: Insufficient documentation

## 2019-03-02 DIAGNOSIS — Z79899 Other long term (current) drug therapy: Secondary | ICD-10-CM | POA: Diagnosis not present

## 2019-03-02 DIAGNOSIS — I110 Hypertensive heart disease with heart failure: Secondary | ICD-10-CM | POA: Diagnosis not present

## 2019-03-02 DIAGNOSIS — I5032 Chronic diastolic (congestive) heart failure: Secondary | ICD-10-CM | POA: Diagnosis not present

## 2019-03-02 NOTE — Patient Instructions (Addendum)
Continue weighing daily and call for an overnight weight gain of > 2 pounds or a weekly weight gain of >5 pounds.  Call us in the future for any questions/problems or to schedule an appointment

## 2019-04-27 ENCOUNTER — Encounter: Payer: Medicare HMO | Attending: Pulmonary Disease

## 2019-04-27 ENCOUNTER — Other Ambulatory Visit: Payer: Self-pay

## 2019-04-27 DIAGNOSIS — J449 Chronic obstructive pulmonary disease, unspecified: Secondary | ICD-10-CM | POA: Insufficient documentation

## 2019-04-27 NOTE — Progress Notes (Signed)
Virtual Orientation performed. Patient informed when to come in for RD and EP orientation. Diagnosis can be found in [facility] 04/20/2019

## 2019-05-01 ENCOUNTER — Other Ambulatory Visit: Payer: Self-pay

## 2019-05-01 VITALS — Ht 66.0 in | Wt 236.7 lb

## 2019-05-01 DIAGNOSIS — J449 Chronic obstructive pulmonary disease, unspecified: Secondary | ICD-10-CM

## 2019-05-01 NOTE — Progress Notes (Signed)
Pulmonary Individual Treatment Plan  Patient Details  Name: Lori Browning MRN: [id number] Date of Birth: 09-Jan-1931 Referring Provider:     Pulmonary Rehab from 05/01/2019 in [facility] Cardiac and Pulmonary Rehab  Referring Provider  Aleskerov      Initial Encounter Date:    Pulmonary Rehab from 05/01/2019 in [facility] Cardiac and Pulmonary Rehab  Date  05/01/19      Visit Diagnosis: Chronic obstructive pulmonary disease, unspecified COPD type (Falls)  Patient's Home Medications on Admission:  Current Outpatient Medications:  .  albuterol (PROVENTIL) (2.5 MG/3ML) 0.083% nebulizer solution, Take 2.5 mg by nebulization every 6 (six) hours as needed for wheezing or shortness of breath., Disp: , Rfl:  .  carvedilol (COREG) 25 MG tablet, Take 25 mg by mouth 2 (two) times daily with a meal., Disp: , Rfl:  .  Cranberry 500 MG CAPS, Take 1 tablet by mouth daily. , Disp: , Rfl:  .  furosemide (LASIX) 40 MG tablet, Take 1 tablet (40 mg total) by mouth daily., Disp: 30 tablet, Rfl: 0 .  pravastatin (PRAVACHOL) 40 MG tablet, Take 40 mg by mouth daily., Disp: , Rfl:  .  spironolactone (ALDACTONE) 25 MG tablet, Take 1 tablet (25 mg total) by mouth daily., Disp: 30 tablet, Rfl: 0  Past Medical History: Past Medical History:  Diagnosis Date  . CHF (congestive heart failure) ([place])   . COPD (chronic obstructive pulmonary disease) ([place])   . Hyperlipemia   . Hypertension   . Urge incontinence     Tobacco Use: Social History   Tobacco Use  Smoking Status Never Smoker  Smokeless Tobacco Never Used    Labs: Recent Review Flowsheet Data    There is no flowsheet data to display.       Pulmonary Assessment Scores:   UCSD: Self-administered rating of dyspnea associated with activities of daily living (ADLs) 6-point scale (0 = "not at all" to 5 = "maximal or unable to do because of breathlessness")  Scoring Scores range from 0 to 120.  Minimally important difference is 5 units  CAT: CAT can  identify the health impairment of COPD patients and is better correlated with disease progression.  CAT has a scoring range of zero to 40. The CAT score is classified into four groups of low (less than 10), medium (10 - 20), high (21-30) and very high (31-40) based on the impact level of disease on health status. A CAT score over 10 suggests significant symptoms.  A worsening CAT score could be explained by an exacerbation, poor medication adherence, poor inhaler technique, or progression of COPD or comorbid conditions.  CAT MCID is 2 points  mMRC: mMRC (Modified Medical Research Council) Dyspnea Scale is used to assess the degree of baseline functional disability in patients of respiratory disease due to dyspnea. No minimal important difference is established. A decrease in score of 1 point or greater is considered a positive change.   Pulmonary Function Assessment: Pulmonary Function Assessment - 04/27/19 1109      Breath   Shortness of Breath  No;Limiting activity       Exercise Target Goals: Exercise Program Goal: Individual exercise prescription set using results from initial 6 min walk test and THRR while considering  patient's activity barriers and safety.   Exercise Prescription Goal: Initial exercise prescription builds to 30-45 minutes a day of aerobic activity, 2-3 days per week.  Home exercise guidelines will be given to patient during program as part of exercise prescription that the participant  will acknowledge.  Activity Barriers & Risk Stratification:   6 Minute Walk: 6 Minute Walk    Row Name 05/01/19 1409         6 Minute Walk   Phase  Initial     Distance  600 feet     Walk Time  5 minutes     # of Rest Breaks  2     MPH  1.36     METS  1.77     RPE  15     Perceived Dyspnea   3     VO2 Peak  1.25     Symptoms  Yes (comment)     Comments  shortness of breath     Resting HR  65 bpm     Resting BP  124/74     Resting Oxygen Saturation   96 %      Exercise Oxygen Saturation  during 6 min walk  90 %     Max Ex. HR  96 bpm     Max Ex. BP  164/76     2 Minute Post BP  152/78       Interval HR   1 Minute HR  83     2 Minute HR  91     3 Minute HR  94     4 Minute HR  80 resting     5 Minute HR  93     6 Minute HR  96     2 Minute Post HR  70     Interval Heart Rate?  Yes       Interval Oxygen   Interval Oxygen?  Yes     1 Minute Oxygen Saturation %  92 %     1 Minute Liters of Oxygen  0 L     2 Minute Oxygen Saturation %  90 %     2 Minute Liters of Oxygen  0 L     3 Minute Oxygen Saturation %  90 %     3 Minute Liters of Oxygen  0 L     4 Minute Oxygen Saturation %  90 %     4 Minute Liters of Oxygen  0 L     5 Minute Oxygen Saturation %  92 %     5 Minute Liters of Oxygen  0 L     6 Minute Oxygen Saturation %  90 %     6 Minute Liters of Oxygen  0 L     2 Minute Post Oxygen Saturation %  96 %     2 Minute Post Liters of Oxygen  0 L       Oxygen Initial Assessment: Oxygen Initial Assessment - 04/27/19 1109      Home Oxygen   Home Oxygen Device  None    Sleep Oxygen Prescription  None    Home Exercise Oxygen Prescription  None    Home at Rest Exercise Oxygen Prescription  None      Initial 6 min Walk   Oxygen Used  None      Program Oxygen Prescription   Program Oxygen Prescription  None      Intervention   Short Term Goals  To learn and understand importance of monitoring SPO2 with pulse oximeter and demonstrate accurate use of the pulse oximeter.;To learn and understand importance of maintaining oxygen saturations>88%;To learn and demonstrate proper pursed lip breathing techniques or other breathing techniques.;To learn and demonstrate proper  use of respiratory medications    Long  Term Goals  Verbalizes importance of monitoring SPO2 with pulse oximeter and return demonstration;Maintenance of O2 saturations>88%;Exhibits proper breathing techniques, such as pursed lip breathing or other method taught during  program session;Compliance with respiratory medication;Demonstrates proper use of MDI's       Oxygen Re-Evaluation:   Oxygen Discharge (Final Oxygen Re-Evaluation):   Initial Exercise Prescription: Initial Exercise Prescription - 05/01/19 1400      Date of Initial Exercise RX and Referring Provider   Date  05/01/19    Referring Provider  Aleskerov      Treadmill   MPH  0.8    Grade  0    Minutes  15    METs  1      NuStep   Level  1    SPM  80    Minutes  15    METs  1      REL-XR   Level  1    Speed  50    Minutes  15    METs  1      T5 Nustep   Level  1    SPM  80    Minutes  15      Prescription Details   Frequency (times per week)  3    Duration  Progress to 30 minutes of continuous aerobic without signs/symptoms of physical distress      Intensity   THRR 40-80% of Max Heartrate  93-120    Ratings of Perceived Exertion  11-13    Perceived Dyspnea  0-4      Resistance Training   Training Prescription  Yes    Weight  3 lb    Reps  10-15       Perform Capillary Blood Glucose checks as needed.  Exercise Prescription Changes: Exercise Prescription Changes    Row Name 05/01/19 1400             Response to Exercise   Blood Pressure (Admit)  124/74       Blood Pressure (Exercise)  164/76       Blood Pressure (Exit)  152/78       Heart Rate (Admit)  65 bpm       Heart Rate (Exercise)  96 bpm       Heart Rate (Exit)  70 bpm       Oxygen Saturation (Admit)  96 %       Oxygen Saturation (Exercise)  90 %       Oxygen Saturation (Exit)  96 %       Rating of Perceived Exertion (Exercise)  15       Perceived Dyspnea (Exercise)  3       Symptoms  shortness of breath          Exercise Comments:   Exercise Goals and Review: Exercise Goals    Row Name 05/01/19 1422             Exercise Goals   Increase Physical Activity  Yes       Intervention  Provide advice, education, support and counseling about physical activity/exercise  needs.;Develop an individualized exercise prescription for aerobic and resistive training based on initial evaluation findings, risk stratification, comorbidities and participant's personal goals.       Expected Outcomes  Short Term: Attend rehab on a regular basis to increase amount of physical activity.;Long Term: Add in home exercise to make exercise part of routine and  to increase amount of physical activity.;Long Term: Exercising regularly at least 3-5 days a week.       Increase Strength and Stamina  Yes       Intervention  Provide advice, education, support and counseling about physical activity/exercise needs.       Expected Outcomes  Short Term: Increase workloads from initial exercise prescription for resistance, speed, and METs.;Short Term: Perform resistance training exercises routinely during rehab and add in resistance training at home;Long Term: Improve cardiorespiratory fitness, muscular endurance and strength as measured by increased METs and functional capacity (6MWT)       Able to understand and use rate of perceived exertion (RPE) scale  Yes       Intervention  Provide education and explanation on how to use RPE scale       Expected Outcomes  Short Term: Able to use RPE daily in rehab to express subjective intensity level;Long Term:  Able to use RPE to guide intensity level when exercising independently       Able to understand and use Dyspnea scale  Yes       Intervention  Provide education and explanation on how to use Dyspnea scale       Expected Outcomes  Short Term: Able to use Dyspnea scale daily in rehab to express subjective sense of shortness of breath during exertion;Long Term: Able to use Dyspnea scale to guide intensity level when exercising independently       Knowledge and understanding of Target Heart Rate Range (THRR)  Yes       Intervention  Provide education and explanation of THRR including how the numbers were predicted and where they are located for reference        Expected Outcomes  Short Term: Able to use daily as guideline for intensity in rehab;Long Term: Able to use THRR to govern intensity when exercising independently;Short Term: Able to state/look up THRR       Able to check pulse independently  Yes       Intervention  Provide education and demonstration on how to check pulse in carotid and radial arteries.;Review the importance of being able to check your own pulse for safety during independent exercise       Expected Outcomes  Short Term: Able to explain why pulse checking is important during independent exercise;Long Term: Able to check pulse independently and accurately       Understanding of Exercise Prescription  Yes       Intervention  Provide education, explanation, and written materials on patient's individual exercise prescription       Expected Outcomes  Short Term: Able to explain program exercise prescription;Long Term: Able to explain home exercise prescription to exercise independently          Exercise Goals Re-Evaluation :   Discharge Exercise Prescription (Final Exercise Prescription Changes): Exercise Prescription Changes - 05/01/19 1400      Response to Exercise   Blood Pressure (Admit)  124/74    Blood Pressure (Exercise)  164/76    Blood Pressure (Exit)  152/78    Heart Rate (Admit)  65 bpm    Heart Rate (Exercise)  96 bpm    Heart Rate (Exit)  70 bpm    Oxygen Saturation (Admit)  96 %    Oxygen Saturation (Exercise)  90 %    Oxygen Saturation (Exit)  96 %    Rating of Perceived Exertion (Exercise)  15    Perceived Dyspnea (Exercise)  3  Symptoms  shortness of breath       Nutrition:  Target Goals: Understanding of nutrition guidelines, daily intake of sodium <1590m, cholesterol <2049m calories 30% from fat and 7% or less from saturated fats, daily to have 5 or more servings of fruits and vegetables.  Biometrics: Pre Biometrics - 05/01/19 1423      Pre Biometrics   Height  5' 6"  (1.676 m)    Weight   236 lb 11.2 oz (107.4 kg)    BMI (Calculated)  38.22    Single Leg Stand  0 seconds        Nutrition Therapy Plan and Nutrition Goals:   Nutrition Assessments:   Nutrition Goals Re-Evaluation:   Nutrition Goals Discharge (Final Nutrition Goals Re-Evaluation):   Psychosocial: Target Goals: Acknowledge presence or absence of significant depression and/or stress, maximize coping skills, provide positive support system. Participant is able to verbalize types and ability to use techniques and skills needed for reducing stress and depression.   Initial Review & Psychosocial Screening: Initial Psych Review & Screening - 04/27/19 1110      Initial Review   Current issues with  None Identified      Family Dynamics   Good Support System?  Yes    Comments  She can look to her nephew and brother for support.      Barriers   Psychosocial barriers to participate in program  The patient should benefit from training in stress management and relaxation.;There are no identifiable barriers or psychosocial needs.      Screening Interventions   Interventions  Encouraged to exercise;Provide feedback about the scores to participant;To provide support and resources with identified psychosocial needs    Expected Outcomes  Short Term goal: Utilizing psychosocial counselor, staff and physician to assist with identification of specific Stressors or current issues interfering with healing process. Setting desired goal for each stressor or current issue identified.;Long Term Goal: Stressors or current issues are controlled or eliminated.;Short Term goal: Identification and review with participant of any Quality of Life or Depression concerns found by scoring the questionnaire.;Long Term goal: The participant improves quality of Life and PHQ9 Scores as seen by post scores and/or verbalization of changes       Quality of Life Scores:  Scores of 19 and below usually indicate a poorer quality of life in  these areas.  A difference of  2-3 points is a clinically meaningful difference.  A difference of 2-3 points in the total score of the Quality of Life Index has been associated with significant improvement in overall quality of life, self-image, physical symptoms, and general health in studies assessing change in quality of life.  PHQ-9: Recent Review Flowsheet Data    [facility]   Decreased Interest 0   Down, Depressed, Hopeless 0   PHQ - 2 Score 0   Altered sleeping 0   Tired, decreased energy 1   Change in appetite 0   Feeling bad or failure about yourself  0   Trouble concentrating 0   Moving slowly or fidgety/restless 0   PHQ-9 Score 1   Difficult doing work/chores Not difficult at all     Interpretation of Total Score  Total Score Depression Severity:  1-4 = Minimal depression, 5-9 = Mild depression, 10-14 = Moderate depression, 15-19 = Moderately severe depression, 20-27 = Severe depression   Psychosocial Evaluation and Intervention: Psychosocial Evaluation - 04/27/19 1113      Psychosocial Evaluation & Interventions  Interventions  Encouraged to exercise with the program and follow exercise prescription    Comments  Patient states that she is not depressed and has a positive outlook on life.    Expected Outcomes  Short: Attend [facility] stress management education to decrease stress. Long: Maintain exercise Post [facility] to keep stress at a minimum.    Continue Psychosocial Services   Follow up required by staff       Psychosocial Re-Evaluation:   Psychosocial Discharge (Final Psychosocial Re-Evaluation):   Education: Education Goals: Education classes will be provided on a weekly basis, covering required topics. Participant will state understanding/return demonstration of topics presented.  Learning Barriers/Preferences: Learning Barriers/Preferences - 04/27/19 1111      Learning Barriers/Preferences   Learning Barriers  None     Learning Preferences  None       Education Topics:  Initial Evaluation Education: - Verbal, written and demonstration of respiratory meds, oximetry and breathing techniques. Instruction on use of nebulizers and MDIs and importance of monitoring MDI activations.   General Nutrition Guidelines/Fats and Fiber: -Group instruction provided by verbal, written material, models and posters to present the general guidelines for heart healthy nutrition. Gives an explanation and review of dietary fats and fiber.   Controlling Sodium/Reading Food Labels: -Group verbal and written material supporting the discussion of sodium use in heart healthy nutrition. Review and explanation with models, verbal and written materials for utilization of the food label.   Exercise Physiology & General Exercise Guidelines: - Group verbal and written instruction with models to review the exercise physiology of the cardiovascular system and associated critical values. Provides general exercise guidelines with specific guidelines to those with heart or lung disease.    Aerobic Exercise & Resistance Training: - Gives group verbal and written instruction on the various components of exercise. Focuses on aerobic and resistive training programs and the benefits of this training and how to safely progress through these programs.   Flexibility, Balance, Mind/Body Relaxation: Provides group verbal/written instruction on the benefits of flexibility and balance training, including mind/body exercise modes such as yoga, pilates and tai chi.  Demonstration and skill practice provided.   Stress and Anxiety: - Provides group verbal and written instruction about the health risks of elevated stress and causes of high stress.  Discuss the correlation between heart/lung disease and anxiety and treatment options. Review healthy ways to manage with stress and anxiety.   Depression: - Provides group verbal and written instruction on  the correlation between heart/lung disease and depressed mood, treatment options, and the stigmas associated with seeking treatment.   Exercise & Equipment Safety: - Individual verbal instruction and demonstration of equipment use and safety with use of the equipment.   Pulmonary Rehab from 05/01/2019 in [facility] Cardiac and Pulmonary Rehab  Date  05/01/19  Educator  AS  Instruction Review Code  1- Verbalizes Understanding      Infection Prevention: - Provides verbal and written material to individual with discussion of infection control including proper hand washing and proper equipment cleaning during exercise session.   Pulmonary Rehab from 05/01/2019 in [facility] Cardiac and Pulmonary Rehab  Date  05/01/19  Educator  AS  Instruction Review Code  1- Verbalizes Understanding      Falls Prevention: - Provides verbal and written material to individual with discussion of falls prevention and safety.   Pulmonary Rehab from 05/01/2019 in [facility] Cardiac and Pulmonary Rehab  Date  05/01/19  Educator  AS  Instruction Review Code  1- [employer]  Understanding      Diabetes: - Individual verbal and written instruction to review signs/symptoms of diabetes, desired ranges of glucose level fasting, after meals and with exercise. Advice that pre and post exercise glucose checks will be done for 3 sessions at entry of program.   Pulmonary Rehab from 05/01/2019 in [facility] Cardiac and Pulmonary Rehab  Date  05/01/19  Educator  AS  Instruction Review Code  1- Verbalizes Understanding      Chronic Lung Diseases: - Group verbal and written instruction to review updates, respiratory medications, advancements in procedures and treatments. Discuss use of supplemental oxygen including available portable oxygen systems, continuous and intermittent flow rates, concentrators, personal use and safety guidelines. Review proper use of inhaler and spacers. Provide informative websites for self-education.    Energy  Conservation: - Provide group verbal and written instruction for methods to conserve energy, plan and organize activities. Instruct on pacing techniques, use of adaptive equipment and posture/positioning to relieve shortness of breath.   Triggers and Exacerbations: - Group verbal and written instruction to review types of environmental triggers and ways to prevent exacerbations. Discuss weather changes, air quality and the benefits of nasal washing. Review warning signs and symptoms to help prevent infections. Discuss techniques for effective airway clearance, coughing, and vibrations.   AED/CPR: - Group verbal and written instruction with the use of models to demonstrate the basic use of the AED with the basic ABC's of resuscitation.   Anatomy and Physiology of the Lungs: - Group verbal and written instruction with the use of models to provide basic lung anatomy and physiology related to function, structure and complications of lung disease.   Anatomy & Physiology of the Heart: - Group verbal and written instruction and models provide basic cardiac anatomy and physiology, with the coronary electrical and arterial systems. Review of Valvular disease and Heart Failure   Cardiac Medications: - Group verbal and written instruction to review commonly prescribed medications for heart disease. Reviews the medication, class of the drug, and side effects.   Know Your Numbers and Risk Factors: -Group verbal and written instruction about important numbers in your health.  Discussion of what are risk factors and how they play a role in the disease process.  Review of Cholesterol, Blood Pressure, Diabetes, and BMI and the role they play in your overall health.   Sleep Hygiene: -Provides group verbal and written instruction about how sleep can affect your health.  Define sleep hygiene, discuss sleep cycles and impact of sleep habits. Review good sleep hygiene tips.    Other: -Provides group and  verbal instruction on various topics (see comments)    Knowledge Questionnaire Score: Knowledge Questionnaire Score - 05/01/19 1425      Knowledge Questionnaire Score   Pre Score  13/18        Core Components/Risk Factors/Patient Goals at Admission: Personal Goals and Risk Factors at Admission - 05/01/19 1423      Core Components/Risk Factors/Patient Goals on Admission    Weight Management  Yes;Weight Loss;Weight Maintenance    Intervention  Weight Management: Develop a combined nutrition and exercise program designed to reach desired caloric intake, while maintaining appropriate intake of nutrient and fiber, sodium and fats, and appropriate energy expenditure required for the weight goal.;Weight Management/Obesity: Establish reasonable short term and long term weight goals.;Weight Management: Provide education and appropriate resources to help participant work on and attain dietary goals.    Admit Weight  236 lb 11.2 oz (107.4 kg)    Goal  Weight: Short Term  225 lb (102.1 kg)    Goal Weight: Long Term  215 lb (97.5 kg)    Expected Outcomes  Long Term: Adherence to nutrition and physical activity/exercise program aimed toward attainment of established weight goal;Short Term: Continue to assess and modify interventions until short term weight is achieved;Weight Maintenance: Understanding of the daily nutrition guidelines, which includes 25-35% calories from fat, 7% or less cal from saturated fats, less than 24m cholesterol, less than 1.5gm of sodium, & 5 or more servings of fruits and vegetables daily;Weight Loss: Understanding of general recommendations for a balanced deficit meal plan, which promotes 1-2 lb weight loss per week and includes a negative energy balance of [phone] kcal/d;Understanding recommendations for meals to include 15-35% energy as protein, 25-35% energy from fat, 35-60% energy from carbohydrates, less than 2054mof dietary cholesterol, 20-35 gm of total fiber  daily;Understanding of distribution of calorie intake throughout the day with the consumption of 4-5 meals/snacks    Intervention  Provide education, individualized exercise plan and daily activity instruction to help decrease symptoms of SOB with activities of daily living.    Expected Outcomes  Short Term: Improve cardiorespiratory fitness to achieve a reduction of symptoms when performing ADLs;Long Term: Be able to perform more ADLs without symptoms or delay the onset of symptoms    Diabetes  Yes    Intervention  Provide education about signs/symptoms and action to take for hypo/hyperglycemia.;Provide education about proper nutrition, including hydration, and aerobic/resistive exercise prescription along with prescribed medications to achieve blood glucose in normal ranges: Fasting glucose 65-99 mg/dL    Expected Outcomes  Short Term: Participant verbalizes understanding of the signs/symptoms and immediate care of hyper/hypoglycemia, proper foot care and importance of medication, aerobic/resistive exercise and nutrition plan for blood glucose control.;Long Term: Attainment of HbA1C < 7%.    Hypertension  Yes    Intervention  Provide education on lifestyle modifcations including regular physical activity/exercise, weight management, moderate sodium restriction and increased consumption of fresh fruit, vegetables, and low fat dairy, alcohol moderation, and smoking cessation.;Monitor prescription use compliance.       Core Components/Risk Factors/Patient Goals Review:    Core Components/Risk Factors/Patient Goals at Discharge (Final Review):    ITP Comments: ITP Comments    Row Name 04/27/19 1114           ITP Comments  Virtual Orientation performed. Patient informed when to come in for RD and EP orientation. [facility]12/[facility]          Comments: initial ITP

## 2019-05-01 NOTE — Patient Instructions (Signed)
Patient Instructions  Patient Details  Name: Lori Browning MRN: [id number] Date of Birth: Jun 24, 1930 Referring Provider:  Ottie Glazier, MD  Below are your personal goals for exercise, nutrition, and risk factors. Our goal is to help you stay on track towards obtaining and maintaining these goals. We will be discussing your progress on these goals with you throughout the program.  Initial Exercise Prescription: Initial Exercise Prescription - 05/01/19 1400      Date of Initial Exercise RX and Referring Provider   Date  05/01/19    Referring Provider  Aleskerov      Treadmill   MPH  0.8    Grade  0    Minutes  15    METs  1      NuStep   Level  1    SPM  80    Minutes  15    METs  1      REL-XR   Level  1    Speed  50    Minutes  15    METs  1      T5 Nustep   Level  1    SPM  80    Minutes  15      Prescription Details   Frequency (times per week)  3    Duration  Progress to 30 minutes of continuous aerobic without signs/symptoms of physical distress      Intensity   THRR 40-80% of Max Heartrate  93-120    Ratings of Perceived Exertion  11-13    Perceived Dyspnea  0-4      Resistance Training   Training Prescription  Yes    Weight  3 lb    Reps  10-15       Exercise Goals: Frequency: Be able to perform aerobic exercise two to three times per week in program working toward 2-5 days per week of home exercise.  Intensity: Work with a perceived exertion of 11 (fairly light) - 15 (hard) while following your exercise prescription.  We will make changes to your prescription with you as you progress through the program.   Duration: Be able to do 30 to 45 minutes of continuous aerobic exercise in addition to a 5 minute warm-up and a 5 minute cool-down routine.   Nutrition Goals: Your personal nutrition goals will be established when you do your nutrition analysis with the dietician.  The following are general nutrition guidelines to follow: Cholesterol <  200mg /day Sodium < 1500mg /day Fiber: Women over 50 yrs - 21 grams per day  Personal Goals: Personal Goals and Risk Factors at Admission - 05/01/19 1423      Core Components/Risk Factors/Patient Goals on Admission    Weight Management  Yes;Weight Loss;Weight Maintenance    Intervention  Weight Management: Develop a combined nutrition and exercise program designed to reach desired caloric intake, while maintaining appropriate intake of nutrient and fiber, sodium and fats, and appropriate energy expenditure required for the weight goal.;Weight Management/Obesity: Establish reasonable short term and long term weight goals.;Weight Management: Provide education and appropriate resources to help participant work on and attain dietary goals.    Admit Weight  236 lb 11.2 oz (107.4 kg)    Goal Weight: Short Term  225 lb (102.1 kg)    Goal Weight: Long Term  215 lb (97.5 kg)    Expected Outcomes  Long Term: Adherence to nutrition and physical activity/exercise program aimed toward attainment of established weight goal;Short Term: Continue to assess and  modify interventions until short term weight is achieved;Weight Maintenance: Understanding of the daily nutrition guidelines, which includes 25-35% calories from fat, 7% or less cal from saturated fats, less than 200mg  cholesterol, less than 1.5gm of sodium, & 5 or more servings of fruits and vegetables daily;Weight Loss: Understanding of general recommendations for a balanced deficit meal plan, which promotes 1-2 lb weight loss per week and includes a negative energy balance of [phone] kcal/d;Understanding recommendations for meals to include 15-35% energy as protein, 25-35% energy from fat, 35-60% energy from carbohydrates, less than 200mg  of dietary cholesterol, 20-35 gm of total fiber daily;Understanding of distribution of calorie intake throughout the day with the consumption of 4-5 meals/snacks    Intervention  Provide education, individualized exercise plan  and daily activity instruction to help decrease symptoms of SOB with activities of daily living.    Expected Outcomes  Short Term: Improve cardiorespiratory fitness to achieve a reduction of symptoms when performing ADLs;Long Term: Be able to perform more ADLs without symptoms or delay the onset of symptoms    Diabetes  Yes    Intervention  Provide education about signs/symptoms and action to take for hypo/hyperglycemia.;Provide education about proper nutrition, including hydration, and aerobic/resistive exercise prescription along with prescribed medications to achieve blood glucose in normal ranges: Fasting glucose 65-99 mg/dL    Expected Outcomes  Short Term: Participant verbalizes understanding of the signs/symptoms and immediate care of hyper/hypoglycemia, proper foot care and importance of medication, aerobic/resistive exercise and nutrition plan for blood glucose control.;Long Term: Attainment of HbA1C < 7%.    Hypertension  Yes    Intervention  Provide education on lifestyle modifcations including regular physical activity/exercise, weight management, moderate sodium restriction and increased consumption of fresh fruit, vegetables, and low fat dairy, alcohol moderation, and smoking cessation.;Monitor prescription use compliance.       Tobacco Use Initial Evaluation: Social History   Tobacco Use  Smoking Status Never Smoker  Smokeless Tobacco Never Used    Exercise Goals and Review: Exercise Goals    Row Name 05/01/19 1422             Exercise Goals   Increase Physical Activity  Yes       Intervention  Provide advice, education, support and counseling about physical activity/exercise needs.;Develop an individualized exercise prescription for aerobic and resistive training based on initial evaluation findings, risk stratification, comorbidities and participant's personal goals.       Expected Outcomes  Short Term: Attend rehab on a regular basis to increase amount of physical  activity.;Long Term: Add in home exercise to make exercise part of routine and to increase amount of physical activity.;Long Term: Exercising regularly at least 3-5 days a week.       Increase Strength and Stamina  Yes       Intervention  Provide advice, education, support and counseling about physical activity/exercise needs.       Expected Outcomes  Short Term: Increase workloads from initial exercise prescription for resistance, speed, and METs.;Short Term: Perform resistance training exercises routinely during rehab and add in resistance training at home;Long Term: Improve cardiorespiratory fitness, muscular endurance and strength as measured by increased METs and functional capacity (6MWT)       Able to understand and use rate of perceived exertion (RPE) scale  Yes       Intervention  Provide education and explanation on how to use RPE scale       Expected Outcomes  Short Term: Able to use RPE daily  in rehab to express subjective intensity level;Long Term:  Able to use RPE to guide intensity level when exercising independently       Able to understand and use Dyspnea scale  Yes       Intervention  Provide education and explanation on how to use Dyspnea scale       Expected Outcomes  Short Term: Able to use Dyspnea scale daily in rehab to express subjective sense of shortness of breath during exertion;Long Term: Able to use Dyspnea scale to guide intensity level when exercising independently       Knowledge and understanding of Target Heart Rate Range (THRR)  Yes       Intervention  Provide education and explanation of THRR including how the numbers were predicted and where they are located for reference       Expected Outcomes  Short Term: Able to use daily as guideline for intensity in rehab;Long Term: Able to use THRR to govern intensity when exercising independently;Short Term: Able to state/look up THRR       Able to check pulse independently  Yes       Intervention  Provide education and  demonstration on how to check pulse in carotid and radial arteries.;Review the importance of being able to check your own pulse for safety during independent exercise       Expected Outcomes  Short Term: Able to explain why pulse checking is important during independent exercise;Long Term: Able to check pulse independently and accurately       Understanding of Exercise Prescription  Yes       Intervention  Provide education, explanation, and written materials on patient's individual exercise prescription       Expected Outcomes  Short Term: Able to explain program exercise prescription;Long Term: Able to explain home exercise prescription to exercise independently          Copy of goals given to participant.

## 2019-05-03 ENCOUNTER — Encounter: Payer: Medicare HMO | Admitting: *Deleted

## 2019-05-03 ENCOUNTER — Encounter: Payer: Self-pay | Admitting: *Deleted

## 2019-05-03 ENCOUNTER — Other Ambulatory Visit: Payer: Self-pay

## 2019-05-03 DIAGNOSIS — J449 Chronic obstructive pulmonary disease, unspecified: Secondary | ICD-10-CM

## 2019-05-03 NOTE — Progress Notes (Signed)
Pulmonary Individual Treatment Plan  Patient Details  Name: Lori Browning MRN: [id number] Date of Birth: 1930-11-30 Referring Provider:     Pulmonary Rehab from 05/01/2019 in [facility] Cardiac and Pulmonary Rehab  Referring Provider  Aleskerov      Initial Encounter Date:    Pulmonary Rehab from 05/01/2019 in [facility] Cardiac and Pulmonary Rehab  Date  05/01/19      Visit Diagnosis: Chronic obstructive pulmonary disease, unspecified COPD type ([place])  Patient's Home Medications on Admission:  Current Outpatient Medications:  .  albuterol (PROVENTIL) (2.5 MG/3ML) 0.083% nebulizer solution, Take 2.5 mg by nebulization every 6 (six) hours as needed for wheezing or shortness of breath., Disp: , Rfl:  .  carvedilol (COREG) 25 MG tablet, Take 25 mg by mouth 2 (two) times daily with a meal., Disp: , Rfl:  .  Cranberry 500 MG CAPS, Take 1 tablet by mouth daily. , Disp: , Rfl:  .  furosemide (LASIX) 40 MG tablet, Take 1 tablet (40 mg total) by mouth daily., Disp: 30 tablet, Rfl: 0 .  pravastatin (PRAVACHOL) 40 MG tablet, Take 40 mg by mouth daily., Disp: , Rfl:  .  spironolactone (ALDACTONE) 25 MG tablet, Take 1 tablet (25 mg total) by mouth daily., Disp: 30 tablet, Rfl: 0  Past Medical History: Past Medical History:  Diagnosis Date  . CHF (congestive heart failure) ([place])   . COPD (chronic obstructive pulmonary disease) ([place])   . Hyperlipemia   . Hypertension   . Urge incontinence     Tobacco Use: Social History   Tobacco Use  Smoking Status Never Smoker  Smokeless Tobacco Never Used    Labs: Recent Review Flowsheet Data    There is no flowsheet data to display.       Pulmonary Assessment Scores: Pulmonary Assessment Scores    Row Name 05/01/19 1426         ADL UCSD   SOB Score total  30     Rest  1     Walk  3     Stairs  4     Bath  2     Dress  2     Shop  3       CAT Score   CAT Score  25       mMRC Score   mMRC Score  3        UCSD: Self-administered  rating of dyspnea associated with activities of daily living (ADLs) 6-point scale (0 = "not at all" to 5 = "maximal or unable to do because of breathlessness")  Scoring Scores range from 0 to 120.  Minimally important difference is 5 units  CAT: CAT can identify the health impairment of COPD patients and is better correlated with disease progression.  CAT has a scoring range of zero to 40. The CAT score is classified into four groups of low (less than 10), medium (10 - 20), high (21-30) and very high (31-40) based on the impact level of disease on health status. A CAT score over 10 suggests significant symptoms.  A worsening CAT score could be explained by an exacerbation, poor medication adherence, poor inhaler technique, or progression of COPD or comorbid conditions.  CAT MCID is 2 points  mMRC: mMRC (Modified Medical Research Council) Dyspnea Scale is used to assess the degree of baseline functional disability in patients of respiratory disease due to dyspnea. No minimal important difference is established. A decrease in score of 1 point or greater is considered  a positive change.   Pulmonary Function Assessment: Pulmonary Function Assessment - 04/27/19 1109      Breath   Shortness of Breath  No;Limiting activity       Exercise Target Goals: Exercise Program Goal: Individual exercise prescription set using results from initial 6 min walk test and THRR while considering  patient's activity barriers and safety.   Exercise Prescription Goal: Initial exercise prescription builds to 30-45 minutes a day of aerobic activity, 2-3 days per week.  Home exercise guidelines will be given to patient during program as part of exercise prescription that the participant will acknowledge.  Activity Barriers & Risk Stratification:   6 Minute Walk: 6 Minute Walk    Row Name 05/01/19 1409         6 Minute Walk   Phase  Initial     Distance  600 feet     Walk Time  5 minutes     # of Rest  Breaks  2     MPH  1.36     METS  1.77     RPE  15     Perceived Dyspnea   3     VO2 Peak  1.25     Symptoms  Yes (comment)     Comments  shortness of breath     Resting HR  65 bpm     Resting BP  124/74     Resting Oxygen Saturation   96 %     Exercise Oxygen Saturation  during 6 min walk  90 %     Max Ex. HR  96 bpm     Max Ex. BP  164/76     2 Minute Post BP  152/78       Interval HR   1 Minute HR  83     2 Minute HR  91     3 Minute HR  94     4 Minute HR  80 resting     5 Minute HR  93     6 Minute HR  96     2 Minute Post HR  70     Interval Heart Rate?  Yes       Interval Oxygen   Interval Oxygen?  Yes     1 Minute Oxygen Saturation %  92 %     1 Minute Liters of Oxygen  0 L     2 Minute Oxygen Saturation %  90 %     2 Minute Liters of Oxygen  0 L     3 Minute Oxygen Saturation %  90 %     3 Minute Liters of Oxygen  0 L     4 Minute Oxygen Saturation %  90 %     4 Minute Liters of Oxygen  0 L     5 Minute Oxygen Saturation %  92 %     5 Minute Liters of Oxygen  0 L     6 Minute Oxygen Saturation %  90 %     6 Minute Liters of Oxygen  0 L     2 Minute Post Oxygen Saturation %  96 %     2 Minute Post Liters of Oxygen  0 L       Oxygen Initial Assessment: Oxygen Initial Assessment - 04/27/19 1109      Home Oxygen   Home Oxygen Device  None    Sleep Oxygen Prescription  None  Home Exercise Oxygen Prescription  None    Home at Rest Exercise Oxygen Prescription  None      Initial 6 min Walk   Oxygen Used  None      Program Oxygen Prescription   Program Oxygen Prescription  None      Intervention   Short Term Goals  To learn and understand importance of monitoring SPO2 with pulse oximeter and demonstrate accurate use of the pulse oximeter.;To learn and understand importance of maintaining oxygen saturations>88%;To learn and demonstrate proper pursed lip breathing techniques or other breathing techniques.;To learn and demonstrate proper use of respiratory  medications    Long  Term Goals  Verbalizes importance of monitoring SPO2 with pulse oximeter and return demonstration;Maintenance of O2 saturations>88%;Exhibits proper breathing techniques, such as pursed lip breathing or other method taught during program session;Compliance with respiratory medication;Demonstrates proper use of MDI's       Oxygen Re-Evaluation:   Oxygen Discharge (Final Oxygen Re-Evaluation):   Initial Exercise Prescription: Initial Exercise Prescription - 05/01/19 1400      Date of Initial Exercise RX and Referring Provider   Date  05/01/19    Referring Provider  Aleskerov      Treadmill   MPH  0.8    Grade  0    Minutes  15    METs  1      NuStep   Level  1    SPM  80    Minutes  15    METs  1      REL-XR   Level  1    Speed  50    Minutes  15    METs  1      T5 Nustep   Level  1    SPM  80    Minutes  15      Prescription Details   Frequency (times per week)  3    Duration  Progress to 30 minutes of continuous aerobic without signs/symptoms of physical distress      Intensity   THRR 40-80% of Max Heartrate  93-120    Ratings of Perceived Exertion  11-13    Perceived Dyspnea  0-4      Resistance Training   Training Prescription  Yes    Weight  3 lb    Reps  10-15       Perform Capillary Blood Glucose checks as needed.  Exercise Prescription Changes: Exercise Prescription Changes    Row Name 05/01/19 1400             Response to Exercise   Blood Pressure (Admit)  124/74       Blood Pressure (Exercise)  164/76       Blood Pressure (Exit)  152/78       Heart Rate (Admit)  65 bpm       Heart Rate (Exercise)  96 bpm       Heart Rate (Exit)  70 bpm       Oxygen Saturation (Admit)  96 %       Oxygen Saturation (Exercise)  90 %       Oxygen Saturation (Exit)  96 %       Rating of Perceived Exertion (Exercise)  15       Perceived Dyspnea (Exercise)  3       Symptoms  shortness of breath          Exercise  Comments:   Exercise Goals and Review: Exercise Goals  [place] Name 05/01/19 1422             Exercise Goals   Increase Physical Activity  Yes       Intervention  Provide advice, education, support and counseling about physical activity/exercise needs.;Develop an individualized exercise prescription for aerobic and resistive training based on initial evaluation findings, risk stratification, comorbidities and participant's personal goals.       Expected Outcomes  Short Term: Attend rehab on a regular basis to increase amount of physical activity.;Long Term: Add in home exercise to make exercise part of routine and to increase amount of physical activity.;Long Term: Exercising regularly at least 3-5 days a week.       Increase Strength and Stamina  Yes       Intervention  Provide advice, education, support and counseling about physical activity/exercise needs.       Expected Outcomes  Short Term: Increase workloads from initial exercise prescription for resistance, speed, and METs.;Short Term: Perform resistance training exercises routinely during rehab and add in resistance training at home;Long Term: Improve cardiorespiratory fitness, muscular endurance and strength as measured by increased METs and functional capacity (6MWT)       Able to understand and use rate of perceived exertion (RPE) scale  Yes       Intervention  Provide education and explanation on how to use RPE scale       Expected Outcomes  Short Term: Able to use RPE daily in rehab to express subjective intensity level;Long Term:  Able to use RPE to guide intensity level when exercising independently       Able to understand and use Dyspnea scale  Yes       Intervention  Provide education and explanation on how to use Dyspnea scale       Expected Outcomes  Short Term: Able to use Dyspnea scale daily in rehab to express subjective sense of shortness of breath during exertion;Long Term: Able to use Dyspnea scale to guide intensity  level when exercising independently       Knowledge and understanding of Target Heart Rate Range (THRR)  Yes       Intervention  Provide education and explanation of THRR including how the numbers were predicted and where they are located for reference       Expected Outcomes  Short Term: Able to use daily as guideline for intensity in rehab;Long Term: Able to use THRR to govern intensity when exercising independently;Short Term: Able to state/look up THRR       Able to check pulse independently  Yes       Intervention  Provide education and demonstration on how to check pulse in carotid and radial arteries.;Review the importance of being able to check your own pulse for safety during independent exercise       Expected Outcomes  Short Term: Able to explain why pulse checking is important during independent exercise;Long Term: Able to check pulse independently and accurately       Understanding of Exercise Prescription  Yes       Intervention  Provide education, explanation, and written materials on patient's individual exercise prescription       Expected Outcomes  Short Term: Able to explain program exercise prescription;Long Term: Able to explain home exercise prescription to exercise independently          Exercise Goals Re-Evaluation :   Discharge Exercise Prescription (Final Exercise Prescription Changes): Exercise Prescription Changes - 05/01/19 1400  Response to Exercise   Blood Pressure (Admit)  124/74    Blood Pressure (Exercise)  164/76    Blood Pressure (Exit)  152/78    Heart Rate (Admit)  65 bpm    Heart Rate (Exercise)  96 bpm    Heart Rate (Exit)  70 bpm    Oxygen Saturation (Admit)  96 %    Oxygen Saturation (Exercise)  90 %    Oxygen Saturation (Exit)  96 %    Rating of Perceived Exertion (Exercise)  15    Perceived Dyspnea (Exercise)  3    Symptoms  shortness of breath       Nutrition:  Target Goals: Understanding of nutrition guidelines, daily intake of  sodium <1540m, cholesterol <2068m calories 30% from fat and 7% or less from saturated fats, daily to have 5 or more servings of fruits and vegetables.  Biometrics: Pre Biometrics - 05/01/19 1423      Pre Biometrics   Height  5' 6"  (1.676 m)    Weight  236 lb 11.2 oz (107.4 kg)    BMI (Calculated)  38.22    Single Leg Stand  0 seconds        Nutrition Therapy Plan and Nutrition Goals:   Nutrition Assessments:   Nutrition Goals Re-Evaluation:   Nutrition Goals Discharge (Final Nutrition Goals Re-Evaluation):   Psychosocial: Target Goals: Acknowledge presence or absence of significant depression and/or stress, maximize coping skills, provide positive support system. Participant is able to verbalize types and ability to use techniques and skills needed for reducing stress and depression.   Initial Review & Psychosocial Screening: Initial Psych Review & Screening - 04/27/19 1110      Initial Review   Current issues with  None Identified      Family Dynamics   Good Support System?  Yes    Comments  She can look to her nephew and brother for support.      Barriers   Psychosocial barriers to participate in program  The patient should benefit from training in stress management and relaxation.;There are no identifiable barriers or psychosocial needs.      Screening Interventions   Interventions  Encouraged to exercise;Provide feedback about the scores to participant;To provide support and resources with identified psychosocial needs    Expected Outcomes  Short Term goal: Utilizing psychosocial counselor, staff and physician to assist with identification of specific Stressors or current issues interfering with healing process. Setting desired goal for each stressor or current issue identified.;Long Term Goal: Stressors or current issues are controlled or eliminated.;Short Term goal: Identification and review with participant of any Quality of Life or Depression concerns found by  scoring the questionnaire.;Long Term goal: The participant improves quality of Life and PHQ9 Scores as seen by post scores and/or verbalization of changes       Quality of Life Scores:  Scores of 19 and below usually indicate a poorer quality of life in these areas.  A difference of  2-3 points is a clinically meaningful difference.  A difference of 2-3 points in the total score of the Quality of Life Index has been associated with significant improvement in overall quality of life, self-image, physical symptoms, and general health in studies assessing change in quality of life.  PHQ-9: Recent Review Flowsheet Data    [facility]   Decreased Interest 0   Down, Depressed, Hopeless 0   PHQ - 2 Score 0   Altered sleeping 0   Tired, decreased  energy 1   Change in appetite 0   Feeling bad or failure about yourself  0   Trouble concentrating 0   Moving slowly or fidgety/restless 0   PHQ-9 Score 1   Difficult doing work/chores Not difficult at all     Interpretation of Total Score  Total Score Depression Severity:  1-4 = Minimal depression, 5-9 = Mild depression, 10-14 = Moderate depression, 15-19 = Moderately severe depression, 20-27 = Severe depression   Psychosocial Evaluation and Intervention: Psychosocial Evaluation - 04/27/19 1113      Psychosocial Evaluation & Interventions   Interventions  Encouraged to exercise with the program and follow exercise prescription    Comments  Patient states that she is not depressed and has a positive outlook on life.    Expected Outcomes  Short: Attend [facility] stress management education to decrease stress. Long: Maintain exercise Post [facility] to keep stress at a minimum.    Continue Psychosocial Services   Follow up required by staff       Psychosocial Re-Evaluation:   Psychosocial Discharge (Final Psychosocial Re-Evaluation):   Education: Education Goals: Education classes will be provided on a weekly basis,  covering required topics. Participant will state understanding/return demonstration of topics presented.  Learning Barriers/Preferences: Learning Barriers/Preferences - 04/27/19 1111      Learning Barriers/Preferences   Learning Barriers  None    Learning Preferences  None       Education Topics:  Initial Evaluation Education: - Verbal, written and demonstration of respiratory meds, oximetry and breathing techniques. Instruction on use of nebulizers and MDIs and importance of monitoring MDI activations.   General Nutrition Guidelines/Fats and Fiber: -Group instruction provided by verbal, written material, models and posters to present the general guidelines for heart healthy nutrition. Gives an explanation and review of dietary fats and fiber.   Controlling Sodium/Reading Food Labels: -Group verbal and written material supporting the discussion of sodium use in heart healthy nutrition. Review and explanation with models, verbal and written materials for utilization of the food label.   Exercise Physiology & General Exercise Guidelines: - Group verbal and written instruction with models to review the exercise physiology of the cardiovascular system and associated critical values. Provides general exercise guidelines with specific guidelines to those with heart or lung disease.    Aerobic Exercise & Resistance Training: - Gives group verbal and written instruction on the various components of exercise. Focuses on aerobic and resistive training programs and the benefits of this training and how to safely progress through these programs.   Flexibility, Balance, Mind/Body Relaxation: Provides group verbal/written instruction on the benefits of flexibility and balance training, including mind/body exercise modes such as yoga, pilates and tai chi.  Demonstration and skill practice provided.   Stress and Anxiety: - Provides group verbal and written instruction about the health risks of  elevated stress and causes of high stress.  Discuss the correlation between heart/lung disease and anxiety and treatment options. Review healthy ways to manage with stress and anxiety.   Depression: - Provides group verbal and written instruction on the correlation between heart/lung disease and depressed mood, treatment options, and the stigmas associated with seeking treatment.   Exercise & Equipment Safety: - Individual verbal instruction and demonstration of equipment use and safety with use of the equipment.   Pulmonary Rehab from 05/01/2019 in [facility] Cardiac and Pulmonary Rehab  Date  05/01/19  Educator  AS  Instruction Review Code  1- Verbalizes Understanding      Infection Prevention: -  Provides verbal and written material to individual with discussion of infection control including proper hand washing and proper equipment cleaning during exercise session.   Pulmonary Rehab from 05/01/2019 in [facility] Cardiac and Pulmonary Rehab  Date  05/01/19  Educator  AS  Instruction Review Code  1- Verbalizes Understanding      Falls Prevention: - Provides verbal and written material to individual with discussion of falls prevention and safety.   Pulmonary Rehab from 05/01/2019 in [facility] Cardiac and Pulmonary Rehab  Date  05/01/19  Educator  AS  Instruction Review Code  1- Verbalizes Understanding      Diabetes: - Individual verbal and written instruction to review signs/symptoms of diabetes, desired ranges of glucose level fasting, after meals and with exercise. Advice that pre and post exercise glucose checks will be done for 3 sessions at entry of program.   Pulmonary Rehab from 05/01/2019 in [facility] Cardiac and Pulmonary Rehab  Date  05/01/19  Educator  AS  Instruction Review Code  1- Verbalizes Understanding      Chronic Lung Diseases: - Group verbal and written instruction to review updates, respiratory medications, advancements in procedures and treatments. Discuss use of supplemental  oxygen including available portable oxygen systems, continuous and intermittent flow rates, concentrators, personal use and safety guidelines. Review proper use of inhaler and spacers. Provide informative websites for self-education.    Energy Conservation: - Provide group verbal and written instruction for methods to conserve energy, plan and organize activities. Instruct on pacing techniques, use of adaptive equipment and posture/positioning to relieve shortness of breath.   Triggers and Exacerbations: - Group verbal and written instruction to review types of environmental triggers and ways to prevent exacerbations. Discuss weather changes, air quality and the benefits of nasal washing. Review warning signs and symptoms to help prevent infections. Discuss techniques for effective airway clearance, coughing, and vibrations.   AED/CPR: - Group verbal and written instruction with the use of models to demonstrate the basic use of the AED with the basic ABC's of resuscitation.   Anatomy and Physiology of the Lungs: - Group verbal and written instruction with the use of models to provide basic lung anatomy and physiology related to function, structure and complications of lung disease.   Anatomy & Physiology of the Heart: - Group verbal and written instruction and models provide basic cardiac anatomy and physiology, with the coronary electrical and arterial systems. Review of Valvular disease and Heart Failure   Cardiac Medications: - Group verbal and written instruction to review commonly prescribed medications for heart disease. Reviews the medication, class of the drug, and side effects.   Know Your Numbers and Risk Factors: -Group verbal and written instruction about important numbers in your health.  Discussion of what are risk factors and how they play a role in the disease process.  Review of Cholesterol, Blood Pressure, Diabetes, and BMI and the role they play in your overall  health.   Sleep Hygiene: -Provides group verbal and written instruction about how sleep can affect your health.  Define sleep hygiene, discuss sleep cycles and impact of sleep habits. Review good sleep hygiene tips.    Other: -Provides group and verbal instruction on various topics (see comments)    Knowledge Questionnaire Score: Knowledge Questionnaire Score - 05/01/19 1425      Knowledge Questionnaire Score   Pre Score  13/18        Core Components/Risk Factors/Patient Goals at Admission: Personal Goals and Risk Factors at Admission - 05/01/19 1423  Core Components/Risk Factors/Patient Goals on Admission    Weight Management  Yes;Weight Loss;Weight Maintenance    Intervention  Weight Management: Develop a combined nutrition and exercise program designed to reach desired caloric intake, while maintaining appropriate intake of nutrient and fiber, sodium and fats, and appropriate energy expenditure required for the weight goal.;Weight Management/Obesity: Establish reasonable short term and long term weight goals.;Weight Management: Provide education and appropriate resources to help participant work on and attain dietary goals.    Admit Weight  236 lb 11.2 oz (107.4 kg)    Goal Weight: Short Term  225 lb (102.1 kg)    Goal Weight: Long Term  215 lb (97.5 kg)    Expected Outcomes  Long Term: Adherence to nutrition and physical activity/exercise program aimed toward attainment of established weight goal;Short Term: Continue to assess and modify interventions until short term weight is achieved;Weight Maintenance: Understanding of the daily nutrition guidelines, which includes 25-35% calories from fat, 7% or less cal from saturated fats, less than 252m cholesterol, less than 1.5gm of sodium, & 5 or more servings of fruits and vegetables daily;Weight Loss: Understanding of general recommendations for a balanced deficit meal plan, which promotes 1-2 lb weight loss per week and includes a  negative energy balance of [phone] kcal/d;Understanding recommendations for meals to include 15-35% energy as protein, 25-35% energy from fat, 35-60% energy from carbohydrates, less than 2053mof dietary cholesterol, 20-35 gm of total fiber daily;Understanding of distribution of calorie intake throughout the day with the consumption of 4-5 meals/snacks    Intervention  Provide education, individualized exercise plan and daily activity instruction to help decrease symptoms of SOB with activities of daily living.    Expected Outcomes  Short Term: Improve cardiorespiratory fitness to achieve a reduction of symptoms when performing ADLs;Long Term: Be able to perform more ADLs without symptoms or delay the onset of symptoms    Diabetes  Yes    Intervention  Provide education about signs/symptoms and action to take for hypo/hyperglycemia.;Provide education about proper nutrition, including hydration, and aerobic/resistive exercise prescription along with prescribed medications to achieve blood glucose in normal ranges: Fasting glucose 65-99 mg/dL    Expected Outcomes  Short Term: Participant verbalizes understanding of the signs/symptoms and immediate care of hyper/hypoglycemia, proper foot care and importance of medication, aerobic/resistive exercise and nutrition plan for blood glucose control.;Long Term: Attainment of HbA1C < 7%.    Hypertension  Yes    Intervention  Provide education on lifestyle modifcations including regular physical activity/exercise, weight management, moderate sodium restriction and increased consumption of fresh fruit, vegetables, and low fat dairy, alcohol moderation, and smoking cessation.;Monitor prescription use compliance.       Core Components/Risk Factors/Patient Goals Review:    Core Components/Risk Factors/Patient Goals at Discharge (Final Review):    ITP Comments: ITP Comments    Row Name 04/27/19 1114 05/03/19 0614         ITP Comments  Virtual Orientation  performed. Patient informed when to come in for RD and EP orientation. [facility]  30 day chart review completed. ITP sent to Dr M Zachery Dakinsedical Director, for review,changes as needed and signature. Continue with ITP if no changes requested         Comments:

## 2019-05-03 NOTE — Progress Notes (Signed)
Daily Session Note  Patient Details  Name: Lori Browning MRN: [id number] Date of Birth: May 22, 1930 Referring Provider:     Pulmonary Rehab from 05/01/2019 in [facility] Cardiac and Pulmonary Rehab  Referring Provider  [name]      Encounter Date: 05/03/2019  Check In: Session Check In - 05/03/19 1641      Check-In   Supervising physician immediately available to respond to emergencies  See telemetry face sheet for immediately available ER MD    Location  [facility]-Cardiac & Pulmonary Rehab    Staff Present  Heath Lark, RN, BSN, CCRP;Melissa Caiola RDN, LDN;Meredith Sherryll Burger, RN BSN    Virtual Visit  No    Medication changes reported      No    Fall or balance concerns reported     No    Warm-up and Cool-down  Performed on first and last piece of equipment    Resistance Training Performed  Yes    VAD Patient?  No    PAD/SET Patient?  No      Pain Assessment   Currently in Pain?  No/denies          Social History   Tobacco Use  Smoking Status Never Smoker  Smokeless Tobacco Never Used    Goals Met:  Proper associated with RPD/PD & O2 Sat Exercise tolerated well Personal goals reviewed No report of cardiac concerns or symptoms  Goals Unmet:  Not Applicable  Comments: First full day of exercise!  Patient was oriented to gym and equipment including functions, settings, policies, and procedures.  Patient's individual exercise prescription and treatment plan were reviewed.  All starting workloads were established based on the results of the 6 minute walk test done at initial orientation visit.  The plan for exercise progression was also introduced and progression will be customized based on patient's performance and goals.    Dr. Emily Filbert is Medical Director for [place] and [facility].

## 2019-05-08 ENCOUNTER — Other Ambulatory Visit: Payer: Self-pay

## 2019-05-08 ENCOUNTER — Encounter: Payer: Medicare HMO | Admitting: *Deleted

## 2019-05-08 DIAGNOSIS — J449 Chronic obstructive pulmonary disease, unspecified: Secondary | ICD-10-CM | POA: Diagnosis not present

## 2019-05-08 NOTE — Progress Notes (Signed)
Daily Session Note  Patient Details  Name: Lori Browning MRN: [id number] Date of Birth: 08-10-1930 Referring Provider:     Pulmonary Rehab from 05/01/2019 in [facility] Cardiac and Pulmonary Rehab  Referring Provider  [name]      Encounter Date: 05/08/2019  Check In: Session Check In - 05/08/19 1539      Check-In   Supervising physician immediately available to respond to emergencies  See telemetry face sheet for immediately available ER MD    Location  [facility]-Cardiac & Pulmonary Rehab    Staff Present  Earlean Shawl, BS, ACSM CEP, Exercise Physiologist;Wilkin Lippy Sherryll Burger, RN BSN;Joseph Darrin Nipper, [place], RCEP, CCRP, CCET    Virtual Visit  No    Medication changes reported      No    Fall or balance concerns reported     No    Warm-up and Cool-down  Performed on first and last piece of equipment    Resistance Training Performed  Yes    VAD Patient?  No    PAD/SET Patient?  No      Pain Assessment   Currently in Pain?  No/denies          Social History   Tobacco Use  Smoking Status Never Smoker  Smokeless Tobacco Never Used    Goals Met:  Independence with exercise equipment Exercise tolerated well No report of cardiac concerns or symptoms Strength training completed today  Goals Unmet:  Not Applicable  Comments: Pt able to follow exercise prescription today without complaint.  Will continue to monitor for progression.    Dr. Emily Filbert is Medical Director for [place] and [facility].

## 2019-05-10 ENCOUNTER — Other Ambulatory Visit: Payer: Self-pay

## 2019-05-10 ENCOUNTER — Encounter: Payer: Medicare HMO | Admitting: *Deleted

## 2019-05-10 DIAGNOSIS — J449 Chronic obstructive pulmonary disease, unspecified: Secondary | ICD-10-CM | POA: Diagnosis not present

## 2019-05-10 NOTE — Progress Notes (Signed)
Daily Session Note  Patient Details  Name: Lori Browning MRN: [id number] Date of Birth: [birth year] Referring Provider:     Pulmonary Rehab from 05/01/2019 in [facility] Cardiac and Pulmonary Rehab  Referring Provider  [name]      Encounter Date: 05/10/2019  Check In: Session Check In - 05/10/19 1721      Check-In   Supervising physician immediately available to respond to emergencies  See telemetry face sheet for immediately available ER MD    Staff Present  Nyoka Cowden, RN, BSN, MA;Meredith Sherryll Burger, RN BSN;Melissa Tilford Pillar RDN, LDN    Virtual Visit  No    Medication changes reported      No    Warm-up and Cool-down  Performed on first and last piece of equipment    Resistance Training Performed  Yes    VAD Patient?  No    PAD/SET Patient?  No      Pain Assessment   Currently in Pain?  No/denies          Social History   Tobacco Use  Smoking Status Never Smoker  Smokeless Tobacco Never Used    Goals Met:  Independence with exercise equipment Exercise tolerated well No report of cardiac concerns or symptoms  Goals Unmet:  Not Applicable  Comments: Pt able to follow exercise prescription today without complaint.  Will continue to monitor for progression.   Dr. Emily Filbert is Medical Director for [place] and [facility].

## 2019-05-15 ENCOUNTER — Other Ambulatory Visit: Payer: Self-pay

## 2019-05-15 ENCOUNTER — Encounter: Payer: Medicare HMO | Attending: Pulmonary Disease | Admitting: *Deleted

## 2019-05-15 DIAGNOSIS — J449 Chronic obstructive pulmonary disease, unspecified: Secondary | ICD-10-CM | POA: Diagnosis present

## 2019-05-15 NOTE — Progress Notes (Signed)
Daily Session Note  Patient Details  Name: Lori Browning MRN: [id number] Date of Birth: 1930/10/29 Referring Provider:     Pulmonary Rehab from 05/01/2019 in [facility] Cardiac and Pulmonary Rehab  Referring Provider  [name]      Encounter Date: 05/15/2019  Check In: Session Check In - 05/15/19 1517      Check-In   Supervising physician immediately available to respond to emergencies  See telemetry face sheet for immediately available ER MD    Location  [facility]-Cardiac & Pulmonary Rehab    Staff Present  Renita Papa, RN Moises Blood, BS, ACSM CEP, Exercise Physiologist;Joseph Tessie Fass RCP,RRT,BSRT    Virtual Visit  No    Medication changes reported      No    Fall or balance concerns reported     No    Warm-up and Cool-down  Performed on first and last piece of equipment    Resistance Training Performed  Yes    VAD Patient?  No    PAD/SET Patient?  No      Pain Assessment   Currently in Pain?  No/denies          Social History   Tobacco Use  Smoking Status Never Smoker  Smokeless Tobacco Never Used    Goals Met:  Independence with exercise equipment Exercise tolerated well No report of cardiac concerns or symptoms Strength training completed today  Goals Unmet:  Not Applicable  Comments: Pt able to follow exercise prescription today without complaint.  Will continue to monitor for progression.    Dr. Emily Filbert is Medical Director for [place] and [facility].

## 2019-05-17 ENCOUNTER — Other Ambulatory Visit: Payer: Self-pay

## 2019-05-17 ENCOUNTER — Encounter: Payer: Medicare HMO | Admitting: *Deleted

## 2019-05-17 DIAGNOSIS — J449 Chronic obstructive pulmonary disease, unspecified: Secondary | ICD-10-CM

## 2019-05-17 NOTE — Progress Notes (Signed)
Daily Session Note  Patient Details  Name: Lori Browning MRN: [id number] Date of Birth: 1930-09-04 Referring Provider:     Pulmonary Rehab from 05/01/2019 in [facility] Cardiac and Pulmonary Rehab  Referring Provider  [name]      Encounter Date: 05/17/2019  Check In: Session Check In - 05/17/19 1529      Check-In   Supervising physician immediately available to respond to emergencies  See telemetry face sheet for immediately available ER MD    Location  [facility]-Cardiac & Pulmonary Rehab    Staff Present  Renita Papa, RN BSN;Melissa Caiola RDN, Rowe Pavy, BA, ACSM CEP, Exercise Physiologist    Virtual Visit  No    Medication changes reported      No    Fall or balance concerns reported     No    Warm-up and Cool-down  Performed on first and last piece of equipment    Resistance Training Performed  Yes    VAD Patient?  No    PAD/SET Patient?  No      Pain Assessment   Currently in Pain?  No/denies          Social History   Tobacco Use  Smoking Status Never Smoker  Smokeless Tobacco Never Used    Goals Met:  Independence with exercise equipment Exercise tolerated well No report of cardiac concerns or symptoms Strength training completed today  Goals Unmet:  Not Applicable  Comments: Pt able to follow exercise prescription today without complaint.  Will continue to monitor for progression.    Dr. Emily Filbert is Medical Director for [place] and [facility].

## 2019-05-22 ENCOUNTER — Other Ambulatory Visit: Payer: Self-pay

## 2019-05-22 ENCOUNTER — Encounter: Payer: Medicare HMO | Admitting: *Deleted

## 2019-05-22 DIAGNOSIS — J449 Chronic obstructive pulmonary disease, unspecified: Secondary | ICD-10-CM | POA: Diagnosis not present

## 2019-05-22 NOTE — Progress Notes (Signed)
Daily Session Note  Patient Details  Name: Lori Browning MRN: [id number] Date of Birth: 26-Oct-1930 Referring Provider:     Pulmonary Rehab from 05/01/2019 in [facility] Cardiac and Pulmonary Rehab  Referring Provider  [name]      Encounter Date: 05/22/2019  Check In: Session Check In - 05/22/19 1538      Check-In   Supervising physician immediately available to respond to emergencies  See telemetry face sheet for immediately available ER MD    Location  [facility]-Cardiac & Pulmonary Rehab    Staff Present  Renita Papa, RN Moises Blood, BS, ACSM CEP, Exercise Physiologist;Joseph Yates [place], [place], RCEP, CCRP, CCET    Virtual Visit  No    Medication changes reported      No    Fall or balance concerns reported     No    Warm-up and Cool-down  Performed on first and last piece of equipment    Resistance Training Performed  Yes    VAD Patient?  No    PAD/SET Patient?  No      Pain Assessment   Currently in Pain?  No/denies          Social History   Tobacco Use  Smoking Status Never Smoker  Smokeless Tobacco Never Used    Goals Met:  Independence with exercise equipment Exercise tolerated well No report of cardiac concerns or symptoms Strength training completed today  Goals Unmet:  Not Applicable  Comments: Pt able to follow exercise prescription today without complaint.  Will continue to monitor for progression.    Dr. Emily Filbert is Medical Director for [place] and [facility].

## 2019-05-24 ENCOUNTER — Other Ambulatory Visit: Payer: Self-pay

## 2019-05-24 ENCOUNTER — Encounter: Payer: Medicare HMO | Admitting: *Deleted

## 2019-05-24 DIAGNOSIS — J449 Chronic obstructive pulmonary disease, unspecified: Secondary | ICD-10-CM | POA: Diagnosis not present

## 2019-05-24 NOTE — Progress Notes (Signed)
Daily Session Note  Patient Details  Name: DEMANI WEYRAUCH MRN: [id number] Date of Birth: 22-Aug-1930 Referring Provider:     Pulmonary Rehab from 05/01/2019 in [facility] Cardiac and Pulmonary Rehab  Referring Provider  [name]      Encounter Date: 05/24/2019  Check In: Session Check In - 05/24/19 1543      Check-In   Supervising physician immediately available to respond to emergencies  See telemetry face sheet for immediately available ER MD    Location  [facility]-Cardiac & Pulmonary Rehab    Staff Present  Renita Papa, RN BSN;Melissa Caiola RDN, LDN;Jessica Luan Pulling, MA, RCEP, CCRP, CCET    Virtual Visit  No    Medication changes reported      No    Fall or balance concerns reported     No    Warm-up and Cool-down  Performed on first and last piece of equipment    Resistance Training Performed  Yes    VAD Patient?  No    PAD/SET Patient?  No      Pain Assessment   Currently in Pain?  No/denies          Social History   Tobacco Use  Smoking Status Never Smoker  Smokeless Tobacco Never Used    Goals Met:  Independence with exercise equipment Exercise tolerated well No report of cardiac concerns or symptoms Strength training completed today  Goals Unmet:  Not Applicable  Comments: Pt able to follow exercise prescription today without complaint.  Will continue to monitor for progression.    Dr. Emily Filbert is Medical Director for [place] and [facility].

## 2019-05-25 ENCOUNTER — Other Ambulatory Visit: Payer: Self-pay

## 2019-05-25 DIAGNOSIS — J449 Chronic obstructive pulmonary disease, unspecified: Secondary | ICD-10-CM

## 2019-05-25 NOTE — Progress Notes (Signed)
Completed Initial RD Eval 

## 2019-05-29 ENCOUNTER — Encounter: Payer: Medicare HMO | Admitting: *Deleted

## 2019-05-29 ENCOUNTER — Other Ambulatory Visit: Payer: Self-pay

## 2019-05-29 DIAGNOSIS — J449 Chronic obstructive pulmonary disease, unspecified: Secondary | ICD-10-CM | POA: Diagnosis not present

## 2019-05-29 NOTE — Progress Notes (Signed)
Daily Session Note  Patient Details  Name: Lori Browning MRN: [id number] Date of Birth: Jul 06, 1930 Referring Provider:     Pulmonary Rehab from 05/01/2019 in [facility] Cardiac and Pulmonary Rehab  Referring Provider  [name]      Encounter Date: 05/29/2019  Check In: Session Check In - 05/29/19 1531      Check-In   Supervising physician immediately available to respond to emergencies  See telemetry face sheet for immediately available ER MD    Location  [facility]-Cardiac & Pulmonary Rehab    Staff Present  Renita Papa, RN BSN;Joseph [address] [place], [place], ACSM CEP, Exercise Physiologist    Virtual Visit  No    Medication changes reported      No    Fall or balance concerns reported     No    Warm-up and Cool-down  Performed on first and last piece of equipment    Resistance Training Performed  Yes    VAD Patient?  No    PAD/SET Patient?  No      Pain Assessment   Currently in Pain?  No/denies          Social History   Tobacco Use  Smoking Status Never Smoker  Smokeless Tobacco Never Used    Goals Met:  Independence with exercise equipment Exercise tolerated well No report of cardiac concerns or symptoms Strength training completed today  Goals Unmet:  Not Applicable  Comments: Pt able to follow exercise prescription today without complaint.  Will continue to monitor for progression.    Dr. Emily Filbert is Medical Director for [place] and [facility].

## 2019-05-31 ENCOUNTER — Other Ambulatory Visit: Payer: Self-pay

## 2019-05-31 ENCOUNTER — Encounter: Payer: Medicare HMO | Admitting: *Deleted

## 2019-05-31 ENCOUNTER — Encounter: Payer: Self-pay | Admitting: *Deleted

## 2019-05-31 DIAGNOSIS — J449 Chronic obstructive pulmonary disease, unspecified: Secondary | ICD-10-CM

## 2019-05-31 NOTE — Progress Notes (Signed)
Daily Session Note  Patient Details  Name: Lori Browning MRN: [id number] Date of Birth: [birth year] Referring Provider:     Pulmonary Rehab from 05/01/2019 in [facility] Cardiac and Pulmonary Rehab  Referring Provider  [name]      Encounter Date: 05/31/2019  Check In: Session Check In - 05/31/19 1526      Check-In   Supervising physician immediately available to respond to emergencies  See telemetry face sheet for immediately available ER MD    Location  [facility]-Cardiac & Pulmonary Rehab    Staff Present  Renita Papa, RN Vickki Hearing, BA, ACSM CEP, Exercise Physiologist    Virtual Visit  No    Medication changes reported      No    Fall or balance concerns reported     No    Warm-up and Cool-down  Performed on first and last piece of equipment    Resistance Training Performed  Yes    VAD Patient?  No    PAD/SET Patient?  No      Pain Assessment   Currently in Pain?  No/denies          Social History   Tobacco Use  Smoking Status Never Smoker  Smokeless Tobacco Never Used    Goals Met:  Independence with exercise equipment Exercise tolerated well No report of cardiac concerns or symptoms Strength training completed today  Goals Unmet:  Not Applicable  Comments: Pt able to follow exercise prescription today without complaint.  Will continue to monitor for progression.    Dr. Emily Filbert is Medical Director for [place] and [facility].

## 2019-05-31 NOTE — Progress Notes (Signed)
Pulmonary Individual Treatment Plan  Patient Details  Name: Lori Browning MRN: [id number] Date of Birth: 06-23-1930 Referring Provider:     Pulmonary Rehab from 05/01/2019 in [facility] Cardiac and Pulmonary Rehab  Referring Provider  Aleskerov      Initial Encounter Date:    Pulmonary Rehab from 05/01/2019 in [facility] Cardiac and Pulmonary Rehab  Date  05/01/19      Visit Diagnosis: Chronic obstructive pulmonary disease, unspecified COPD type ([place])  Patient's Home Medications on Admission:  Current Outpatient Medications:  .  albuterol (PROVENTIL) (2.5 MG/3ML) 0.083% nebulizer solution, Take 2.5 mg by nebulization every 6 (six) hours as needed for wheezing or shortness of breath., Disp: , Rfl:  .  carvedilol (COREG) 25 MG tablet, Take 25 mg by mouth 2 (two) times daily with a meal., Disp: , Rfl:  .  Cranberry 500 MG CAPS, Take 1 tablet by mouth daily. , Disp: , Rfl:  .  furosemide (LASIX) 40 MG tablet, Take 1 tablet (40 mg total) by mouth daily., Disp: 30 tablet, Rfl: 0 .  pravastatin (PRAVACHOL) 40 MG tablet, Take 40 mg by mouth daily., Disp: , Rfl:  .  spironolactone (ALDACTONE) 25 MG tablet, Take 1 tablet (25 mg total) by mouth daily., Disp: 30 tablet, Rfl: 0  Past Medical History: Past Medical History:  Diagnosis Date  . CHF (congestive heart failure) ([place])   . COPD (chronic obstructive pulmonary disease) ([place])   . Hyperlipemia   . Hypertension   . Urge incontinence     Tobacco Use: Social History   Tobacco Use  Smoking Status Never Smoker  Smokeless Tobacco Never Used    Labs: Recent Review Flowsheet Data    There is no flowsheet data to display.       Pulmonary Assessment Scores: Pulmonary Assessment Scores    Row Name 05/01/19 1426         ADL UCSD   SOB Score total  30     Rest  1     Walk  3     Stairs  4     Bath  2     Dress  2     Shop  3       CAT Score   CAT Score  25       mMRC Score   mMRC Score  3        UCSD: Self-administered  rating of dyspnea associated with activities of daily living (ADLs) 6-point scale (0 = "not at all" to 5 = "maximal or unable to do because of breathlessness")  Scoring Scores range from 0 to 120.  Minimally important difference is 5 units  CAT: CAT can identify the health impairment of COPD patients and is better correlated with disease progression.  CAT has a scoring range of zero to 40. The CAT score is classified into four groups of low (less than 10), medium (10 - 20), high (21-30) and very high (31-40) based on the impact level of disease on health status. A CAT score over 10 suggests significant symptoms.  A worsening CAT score could be explained by an exacerbation, poor medication adherence, poor inhaler technique, or progression of COPD or comorbid conditions.  CAT MCID is 2 points  mMRC: mMRC (Modified Medical Research Council) Dyspnea Scale is used to assess the degree of baseline functional disability in patients of respiratory disease due to dyspnea. No minimal important difference is established. A decrease in score of 1 point or greater is considered  a positive change.   Pulmonary Function Assessment: Pulmonary Function Assessment - 04/27/19 1109      Breath   Shortness of Breath  No;Limiting activity       Exercise Target Goals: Exercise Program Goal: Individual exercise prescription set using results from initial 6 min walk test and THRR while considering  patient's activity barriers and safety.   Exercise Prescription Goal: Initial exercise prescription builds to 30-45 minutes a day of aerobic activity, 2-3 days per week.  Home exercise guidelines will be given to patient during program as part of exercise prescription that the participant will acknowledge.  Education: Aerobic Exercise & Resistance Training: - Gives group verbal and written instruction on the various components of exercise. Focuses on aerobic and resistive training programs and the benefits of this  training and how to safely progress through these programs..   Education: Exercise & Equipment Safety: - Individual verbal instruction and demonstration of equipment use and safety with use of the equipment.   Pulmonary Rehab from 05/01/2019 in [facility] Cardiac and Pulmonary Rehab  Date  05/01/19  Educator  AS  Instruction Review Code  1- Verbalizes Understanding      Education: Exercise Physiology & General Exercise Guidelines: - Group verbal and written instruction with models to review the exercise physiology of the cardiovascular system and associated critical values. Provides general exercise guidelines with specific guidelines to those with heart or lung disease.    Education: Flexibility, Balance, Mind/Body Relaxation: Provides group verbal/written instruction on the benefits of flexibility and balance training, including mind/body exercise modes such as yoga, pilates and tai chi.  Demonstration and skill practice provided.   Activity Barriers & Risk Stratification:   6 Minute Walk: 6 Minute Walk    Row Name 05/01/19 1409         6 Minute Walk   Phase  Initial     Distance  600 feet     Walk Time  5 minutes     # of Rest Breaks  2     MPH  1.36     METS  1.77     RPE  15     Perceived Dyspnea   3     VO2 Peak  1.25     Symptoms  Yes (comment)     Comments  shortness of breath     Resting HR  65 bpm     Resting BP  124/74     Resting Oxygen Saturation   96 %     Exercise Oxygen Saturation  during 6 min walk  90 %     Max Ex. HR  96 bpm     Max Ex. BP  164/76     2 Minute Post BP  152/78       Interval HR   1 Minute HR  83     2 Minute HR  91     3 Minute HR  94     4 Minute HR  80 resting     5 Minute HR  93     6 Minute HR  96     2 Minute Post HR  70     Interval Heart Rate?  Yes       Interval Oxygen   Interval Oxygen?  Yes     1 Minute Oxygen Saturation %  92 %     1 Minute Liters of Oxygen  0 L     2 Minute Oxygen Saturation %  90 %     2 Minute  Liters of Oxygen  0 L     3 Minute Oxygen Saturation %  90 %     3 Minute Liters of Oxygen  0 L     4 Minute Oxygen Saturation %  90 %     4 Minute Liters of Oxygen  0 L     5 Minute Oxygen Saturation %  92 %     5 Minute Liters of Oxygen  0 L     6 Minute Oxygen Saturation %  90 %     6 Minute Liters of Oxygen  0 L     2 Minute Post Oxygen Saturation %  96 %     2 Minute Post Liters of Oxygen  0 L       Oxygen Initial Assessment: Oxygen Initial Assessment - 04/27/19 1109      Home Oxygen   Home Oxygen Device  None    Sleep Oxygen Prescription  None    Home Exercise Oxygen Prescription  None    Home at Rest Exercise Oxygen Prescription  None      Initial 6 min Walk   Oxygen Used  None      Program Oxygen Prescription   Program Oxygen Prescription  None      Intervention   Short Term Goals  To learn and understand importance of monitoring SPO2 with pulse oximeter and demonstrate accurate use of the pulse oximeter.;To learn and understand importance of maintaining oxygen saturations>88%;To learn and demonstrate proper pursed lip breathing techniques or other breathing techniques.;To learn and demonstrate proper use of respiratory medications    Long  Term Goals  Verbalizes importance of monitoring SPO2 with pulse oximeter and return demonstration;Maintenance of O2 saturations>88%;Exhibits proper breathing techniques, such as pursed lip breathing or other method taught during program session;Compliance with respiratory medication;Demonstrates proper use of MDI's       Oxygen Re-Evaluation: Oxygen Re-Evaluation    Row Name 05/03/19 1643             Program Oxygen Prescription   Program Oxygen Prescription  None         Home Oxygen   Home Oxygen Device  None       Sleep Oxygen Prescription  None       Home Exercise Oxygen Prescription  None       Home at Rest Exercise Oxygen Prescription  None       Compliance with Home Oxygen Use  Yes         Goals/Expected Outcomes    Short Term Goals  To learn and understand importance of maintaining oxygen saturations>88%;To learn and understand importance of monitoring SPO2 with pulse oximeter and demonstrate accurate use of the pulse oximeter.;To learn and demonstrate proper pursed lip breathing techniques or other breathing techniques.       Long  Term Goals  Verbalizes importance of monitoring SPO2 with pulse oximeter and return demonstration;Maintenance of O2 saturations>88%;Exhibits proper breathing techniques, such as pursed lip breathing or other method taught during program session       Comments  Reviewed PLB technique with pt.  Talked about how it works and it's importance in maintaining their exercise saturations.  MAintianing Exercise saturations above 88% wether on oxygen or not       Goals/Expected Outcomes  Short: Become more profiecient at using PLB and being able to monitor pulse oximetry readings  Long: Become independent at using PLB.  Oxygen Discharge (Final Oxygen Re-Evaluation): Oxygen Re-Evaluation - 05/03/19 1643      Program Oxygen Prescription   Program Oxygen Prescription  None      Home Oxygen   Home Oxygen Device  None    Sleep Oxygen Prescription  None    Home Exercise Oxygen Prescription  None    Home at Rest Exercise Oxygen Prescription  None    Compliance with Home Oxygen Use  Yes      Goals/Expected Outcomes   Short Term Goals  To learn and understand importance of maintaining oxygen saturations>88%;To learn and understand importance of monitoring SPO2 with pulse oximeter and demonstrate accurate use of the pulse oximeter.;To learn and demonstrate proper pursed lip breathing techniques or other breathing techniques.    Long  Term Goals  Verbalizes importance of monitoring SPO2 with pulse oximeter and return demonstration;Maintenance of O2 saturations>88%;Exhibits proper breathing techniques, such as pursed lip breathing or other method taught during program session     Comments  Reviewed PLB technique with pt.  Talked about how it works and it's importance in maintaining their exercise saturations.  MAintianing Exercise saturations above 88% wether on oxygen or not    Goals/Expected Outcomes  Short: Become more profiecient at using PLB and being able to monitor pulse oximetry readings  Long: Become independent at using PLB.       Initial Exercise Prescription: Initial Exercise Prescription - 05/01/19 1400      Date of Initial Exercise RX and Referring Provider   Date  05/01/19    Referring Provider  Aleskerov      Treadmill   MPH  0.8    Grade  0    Minutes  15    METs  1      NuStep   Level  1    SPM  80    Minutes  15    METs  1      REL-XR   Level  1    Speed  50    Minutes  15    METs  1      T5 Nustep   Level  1    SPM  80    Minutes  15      Prescription Details   Frequency (times per week)  3    Duration  Progress to 30 minutes of continuous aerobic without signs/symptoms of physical distress      Intensity   THRR 40-80% of Max Heartrate  93-120    Ratings of Perceived Exertion  11-13    Perceived Dyspnea  0-4      Resistance Training   Training Prescription  Yes    Weight  3 lb    Reps  10-15       Perform Capillary Blood Glucose checks as needed.  Exercise Prescription Changes: Exercise Prescription Changes    Row Name 05/01/19 1400 05/08/19 1400 05/24/19 1100 05/29/19 1700       Response to Exercise   Blood Pressure (Admit)  124/74  138/64  112/64  -    Blood Pressure (Exercise)  164/76  170/74  142/62  -    Blood Pressure (Exit)  152/78  130/68  120/62  -    Heart Rate (Admit)  65 bpm  88 bpm  69 bpm  -    Heart Rate (Exercise)  96 bpm  76 bpm  78 bpm  -    Heart Rate (Exit)  70 bpm  62 bpm  69 bpm  -    Oxygen Saturation (Admit)  96 %  88 %  94 %  -    Oxygen Saturation (Exercise)  90 %  92 %  91 %  -    Oxygen Saturation (Exit)  96 %  92 %  93 %  -    Rating of Perceived Exertion (Exercise)  15  11   15   -    Perceived Dyspnea (Exercise)  3  -  2  -    Symptoms  shortness of breath  SOB  -  -    Comments  -  first full day of exercise  -  -    Duration  -  Progress to 30 minutes of  aerobic without signs/symptoms of physical distress  Progress to 30 minutes of  aerobic without signs/symptoms of physical distress  -    Intensity  -  THRR unchanged  THRR unchanged  -      Progression   Progression  -  Continue to progress workloads to maintain intensity without signs/symptoms of physical distress.  Continue to progress workloads to maintain intensity without signs/symptoms of physical distress.  -    Average METs  -  1.65  1.6  -      Resistance Training   Training Prescription  -  Yes  Yes  -    Weight  -  3 lb  3 lb  -    Reps  -  10-15  10-15  -      Interval Training   Interval Training  -  No  No  -      Treadmill   MPH  -  0.8  0.8  -    Grade  -  0  0  -    Minutes  -  15  15  -    METs  -  1.6  1.6  -      T5 Nustep   Level  -  1  1  -    SPM  -  -  80  -    Minutes  -  15  15  -    METs  -  1.7  -  -      Home Exercise Plan   Plans to continue exercise at  -  -  -  Home (comment) walk and stationary bike    Frequency  -  -  -  Add 2 additional days to program exercise sessions.    Initial Home Exercises Provided  -  -  -  05/29/19       Exercise Comments: Exercise Comments    Row Name 05/03/19 1642           Exercise Comments  First full day of exercise!  Patient was oriented to gym and equipment including functions, settings, policies, and procedures.  Patient's individual exercise prescription and treatment plan were reviewed.  All starting workloads were established based on the results of the 6 minute walk test done at initial orientation visit.  The plan for exercise progression was also introduced and progression will be customized based on patient's performance and goals.          Exercise Goals and Review: Exercise Goals    Row Name 05/01/19 1422              Exercise Goals   Increase Physical Activity  Yes       Intervention  Provide advice, education, support and counseling about physical activity/exercise needs.;Develop an individualized exercise prescription for aerobic and resistive training based on initial evaluation findings, risk stratification, comorbidities and participant's personal goals.       Expected Outcomes  Short Term: Attend rehab on a regular basis to increase amount of physical activity.;Long Term: Add in home exercise to make exercise part of routine and to increase amount of physical activity.;Long Term: Exercising regularly at least 3-5 days a week.       Increase Strength and Stamina  Yes       Intervention  Provide advice, education, support and counseling about physical activity/exercise needs.       Expected Outcomes  Short Term: Increase workloads from initial exercise prescription for resistance, speed, and METs.;Short Term: Perform resistance training exercises routinely during rehab and add in resistance training at home;Long Term: Improve cardiorespiratory fitness, muscular endurance and strength as measured by increased METs and functional capacity (6MWT)       Able to understand and use rate of perceived exertion (RPE) scale  Yes       Intervention  Provide education and explanation on how to use RPE scale       Expected Outcomes  Short Term: Able to use RPE daily in rehab to express subjective intensity level;Long Term:  Able to use RPE to guide intensity level when exercising independently       Able to understand and use Dyspnea scale  Yes       Intervention  Provide education and explanation on how to use Dyspnea scale       Expected Outcomes  Short Term: Able to use Dyspnea scale daily in rehab to express subjective sense of shortness of breath during exertion;Long Term: Able to use Dyspnea scale to guide intensity level when exercising independently       Knowledge and understanding of Target Heart  Rate Range (THRR)  Yes       Intervention  Provide education and explanation of THRR including how the numbers were predicted and where they are located for reference       Expected Outcomes  Short Term: Able to use daily as guideline for intensity in rehab;Long Term: Able to use THRR to govern intensity when exercising independently;Short Term: Able to state/look up THRR       Able to check pulse independently  Yes       Intervention  Provide education and demonstration on how to check pulse in carotid and radial arteries.;Review the importance of being able to check your own pulse for safety during independent exercise       Expected Outcomes  Short Term: Able to explain why pulse checking is important during independent exercise;Long Term: Able to check pulse independently and accurately       Understanding of Exercise Prescription  Yes       Intervention  Provide education, explanation, and written materials on patient's individual exercise prescription       Expected Outcomes  Short Term: Able to explain program exercise prescription;Long Term: Able to explain home exercise prescription to exercise independently          Exercise Goals Re-Evaluation : Exercise Goals Re-Evaluation    Row Name 05/03/19 1642 05/24/19 1112 05/24/19 1545 05/29/19 1741       Exercise Goal Re-Evaluation   Exercise Goals Review  Able to understand and use rate of perceived exertion (RPE) scale;Able to understand and use Dyspnea scale;Knowledge and understanding of Target Heart  Rate Range (THRR);Understanding of Exercise Prescription  Increase Physical Activity;Increase Strength and Stamina;Able to understand and use rate of perceived exertion (RPE) scale;Able to understand and use Dyspnea scale;Knowledge and understanding of Target Heart Rate Range (THRR);Able to check pulse independently;Understanding of Exercise Prescription  Increase Physical Activity;Increase Strength and Stamina;Able to understand and use rate of  perceived exertion (RPE) scale;Able to understand and use Dyspnea scale;Knowledge and understanding of Target Heart Rate Range (THRR);Able to check pulse independently;Understanding of Exercise Prescription  Increase Physical Activity;Increase Strength and Stamina;Able to understand and use rate of perceived exertion (RPE) scale;Able to understand and use Dyspnea scale;Knowledge and understanding of Target Heart Rate Range (THRR);Able to check pulse independently;Understanding of Exercise Prescription    Comments  Reviewed RPE scale, THR and program prescription with pt today.  Pt voiced understanding and was given a copy of goals to take home.  Joyous has been attending consistently and tolerates exercise well.  Staff willl monitor progress.  Kimra has ben having knee pain starting last Friday 05/19/19; she has been using ice and it has been feeling better and the swelling has gone down. Jess talked to pt regarding ice and heat as well as exercise.  Reviewed home exercise with pt today.  Pt plans to walk and use stationary bike for exercise.  Reviewed THR, pulse, RPE, sign and symptoms, NTG use, and when to call 911 or MD.  Also discussed weather considerations and indoor options.  Pt voiced understanding.    Expected Outcomes  Short: Use RPE daily to regulate intensity. Long: Follow program prescription in THR.  Short:  continue to attend consistently Long:  increase overall MET level  Short:  continue to attend consistently, ice leg and rest leg until feeling better.  Long:  increase overall MET level  Short: add in a day between Wed and Monday sessions Long: maintain exercise on her own       Discharge Exercise Prescription (Final Exercise Prescription Changes): Exercise Prescription Changes - 05/29/19 1700      Home Exercise Plan   Plans to continue exercise at  Home (comment)   walk and stationary bike   Frequency  Add 2 additional days to program exercise sessions.    Initial Home Exercises  Provided  05/29/19       Nutrition:  Target Goals: Understanding of nutrition guidelines, daily intake of sodium <1589m, cholesterol <2016m calories 30% from fat and 7% or less from saturated fats, daily to have 5 or more servings of fruits and vegetables.  Education: Controlling Sodium/Reading Food Labels -Group verbal and written material supporting the discussion of sodium use in heart healthy nutrition. Review and explanation with models, verbal and written materials for utilization of the food label.   Education: General Nutrition Guidelines/Fats and Fiber: -Group instruction provided by verbal, written material, models and posters to present the general guidelines for heart healthy nutrition. Gives an explanation and review of dietary fats and fiber.   Biometrics: Pre Biometrics - 05/01/19 1423      Pre Biometrics   Height  5' 6"  (1.676 m)    Weight  236 lb 11.2 oz (107.4 kg)    BMI (Calculated)  38.22    Single Leg Stand  0 seconds        Nutrition Therapy Plan and Nutrition Goals: Nutrition Therapy & Goals - 05/25/19 1233      Nutrition Therapy   Diet  Low Na, HH    Protein (specify units)  85-90g    Fiber  25 grams    Whole Grain Foods  3 servings    Saturated Fats  12 max. grams    Fruits and Vegetables  5 servings/day    Sodium  1.5 grams      Personal Nutrition Goals   Nutrition Goal  ST: modify take out foods LT: Improve SOB, lose weight    Comments  She lives at home alone alone and mostly eats out. Breakfast is typically scrambled eggs, sausage, english muffin and black coffee; Lunch: fruit; Dinner: hibachi steak and white rice OR a vegetable plate. Pt does not add salt to food. The park, [place], [employer], [employer], [employer], [employer], delancies, etc. tries to watch what she eats when going out. Does not want to stop going out. Discussed HH eating      Intervention Plan   Intervention  Prescribe, educate and  counsel regarding individualized specific dietary modifications aiming towards targeted core components such as weight, hypertension, lipid management, diabetes, heart failure and other comorbidities.;Nutrition handout(s) given to patient.    Expected Outcomes  Short Term Goal: Understand basic principles of dietary content, such as calories, fat, sodium, cholesterol and nutrients.;Short Term Goal: A plan has been developed with personal nutrition goals set during dietitian appointment.;Long Term Goal: Adherence to prescribed nutrition plan.       Nutrition Assessments:   MEDIFICTS Score Key:          ?70 Need to make dietary changes          40-70 Heart Healthy Diet         ? 40 Therapeutic Level Cholesterol Diet  Nutrition Goals Re-Evaluation:   Nutrition Goals Discharge (Final Nutrition Goals Re-Evaluation):   Psychosocial: Target Goals: Acknowledge presence or absence of significant depression and/or stress, maximize coping skills, provide positive support system. Participant is able to verbalize types and ability to use techniques and skills needed for reducing stress and depression.   Education: Depression - Provides group verbal and written instruction on the correlation between heart/lung disease and depressed mood, treatment options, and the stigmas associated with seeking treatment.   Education: Sleep Hygiene -Provides group verbal and written instruction about how sleep can affect your health.  Define sleep hygiene, discuss sleep cycles and impact of sleep habits. Review good sleep hygiene tips.    Education: Stress and Anxiety: - Provides group verbal and written instruction about the health risks of elevated stress and causes of high stress.  Discuss the correlation between heart/lung disease and anxiety and treatment options. Review healthy ways to manage with stress and anxiety.   Initial Review & Psychosocial Screening: Initial Psych Review & Screening - 04/27/19  1110      Initial Review   Current issues with  None Identified      Family Dynamics   Good Support System?  Yes    Comments  She can look to her nephew and brother for support.      Barriers   Psychosocial barriers to participate in program  The patient should benefit from training in stress management and relaxation.;There are no identifiable barriers or psychosocial needs.      Screening Interventions   Interventions  Encouraged to exercise;Provide feedback about the scores to participant;To provide support and resources with identified psychosocial needs    Expected Outcomes  Short Term goal: Utilizing psychosocial counselor, staff and physician to assist with identification of specific Stressors or current issues interfering with healing process. Setting desired goal  for each stressor or current issue identified.;Long Term Goal: Stressors or current issues are controlled or eliminated.;Short Term goal: Identification and review with participant of any Quality of Life or Depression concerns found by scoring the questionnaire.;Long Term goal: The participant improves quality of Life and PHQ9 Scores as seen by post scores and/or verbalization of changes       Quality of Life Scores:  Scores of 19 and below usually indicate a poorer quality of life in these areas.  A difference of  2-3 points is a clinically meaningful difference.  A difference of 2-3 points in the total score of the Quality of Life Index has been associated with significant improvement in overall quality of life, self-image, physical symptoms, and general health in studies assessing change in quality of life.  PHQ-9: Recent Review Flowsheet Data    Depression screen [facility] 2/9 05/01/2019   Decreased Interest 0   Down, Depressed, Hopeless 0   PHQ - 2 Score 0   Altered sleeping 0   Tired, decreased energy 1   Change in appetite 0   Feeling bad or failure about yourself  0   Trouble concentrating 0   Moving slowly or  fidgety/restless 0   PHQ-9 Score 1   Difficult doing work/chores Not difficult at all     Interpretation of Total Score  Total Score Depression Severity:  1-4 = Minimal depression, 5-9 = Mild depression, 10-14 = Moderate depression, 15-19 = Moderately severe depression, 20-27 = Severe depression   Psychosocial Evaluation and Intervention: Psychosocial Evaluation - 04/27/19 1113      Psychosocial Evaluation & Interventions   Interventions  Encouraged to exercise with the program and follow exercise prescription    Comments  Patient states that she is not depressed and has a positive outlook on life.    Expected Outcomes  Short: Attend [facility] stress management education to decrease stress. Long: Maintain exercise [facility] to keep stress at a minimum.    Continue Psychosocial Services   Follow up required by staff       Psychosocial Re-Evaluation: Psychosocial Re-Evaluation    Wilson Name 05/24/19 1601             Psychosocial Re-Evaluation   Current issues with  None Identified       Comments  Pt reports no problems.       Expected Outcomes  Continue to manage stress levels.       Interventions  Encouraged to attend Pulmonary Rehabilitation for the exercise       Continue Psychosocial Services   Follow up required by staff          Psychosocial Discharge (Final Psychosocial Re-Evaluation): Psychosocial Re-Evaluation - 05/24/19 1601      Psychosocial Re-Evaluation   Current issues with  None Identified    Comments  Pt reports no problems.    Expected Outcomes  Continue to manage stress levels.    Interventions  Encouraged to attend Pulmonary Rehabilitation for the exercise    Continue Psychosocial Services   Follow up required by staff       Education: Education Goals: Education classes will be provided on a weekly basis, covering required topics. Participant will state understanding/return demonstration of topics presented.  Learning  Barriers/Preferences: Learning Barriers/Preferences - 04/27/19 1111      Learning Barriers/Preferences   Learning Barriers  None    Learning Preferences  None       General Pulmonary Education Topics:  Infection Prevention: - Provides verbal  and written material to individual with discussion of infection control including proper hand washing and proper equipment cleaning during exercise session.   Pulmonary Rehab from 05/01/2019 in [facility] Cardiac and Pulmonary Rehab  Date  05/01/19  Educator  AS  Instruction Review Code  1- Verbalizes Understanding      Falls Prevention: - Provides verbal and written material to individual with discussion of falls prevention and safety.   Pulmonary Rehab from 05/01/2019 in [facility] Cardiac and Pulmonary Rehab  Date  05/01/19  Educator  AS  Instruction Review Code  1- Verbalizes Understanding      Chronic Lung Diseases: - Group verbal and written instruction to review updates, respiratory medications, advancements in procedures and treatments. Discuss use of supplemental oxygen including available portable oxygen systems, continuous and intermittent flow rates, concentrators, personal use and safety guidelines. Review proper use of inhaler and spacers. Provide informative websites for self-education.    Energy Conservation: - Provide group verbal and written instruction for methods to conserve energy, plan and organize activities. Instruct on pacing techniques, use of adaptive equipment and posture/positioning to relieve shortness of breath.   Triggers and Exacerbations: - Group verbal and written instruction to review types of environmental triggers and ways to prevent exacerbations. Discuss weather changes, air quality and the benefits of nasal washing. Review warning signs and symptoms to help prevent infections. Discuss techniques for effective airway clearance, coughing, and vibrations.   AED/CPR: - Group verbal and written instruction with the  use of models to demonstrate the basic use of the AED with the basic ABC's of resuscitation.   Anatomy and Physiology of the Lungs: - Group verbal and written instruction with the use of models to provide basic lung anatomy and physiology related to function, structure and complications of lung disease.   Anatomy & Physiology of the Heart: - Group verbal and written instruction and models provide basic cardiac anatomy and physiology, with the coronary electrical and arterial systems. Review of Valvular disease and Heart Failure   Cardiac Medications: - Group verbal and written instruction to review commonly prescribed medications for heart disease. Reviews the medication, class of the drug, and side effects.   Other: -Provides group and verbal instruction on various topics (see comments)   Knowledge Questionnaire Score: Knowledge Questionnaire Score - 05/01/19 1425      Knowledge Questionnaire Score   Pre Score  13/18        Core Components/Risk Factors/Patient Goals at Admission: Personal Goals and Risk Factors at Admission - 05/01/19 1423      Core Components/Risk Factors/Patient Goals on Admission    Weight Management  Yes;Weight Loss;Weight Maintenance    Intervention  Weight Management: Develop a combined nutrition and exercise program designed to reach desired caloric intake, while maintaining appropriate intake of nutrient and fiber, sodium and fats, and appropriate energy expenditure required for the weight goal.;Weight Management/Obesity: Establish reasonable short term and long term weight goals.;Weight Management: Provide education and appropriate resources to help participant work on and attain dietary goals.    Admit Weight  236 lb 11.2 oz (107.4 kg)    Goal Weight: Short Term  225 lb (102.1 kg)    Goal Weight: Long Term  215 lb (97.5 kg)    Expected Outcomes  Long Term: Adherence to nutrition and physical activity/exercise program aimed toward attainment of  established weight goal;Short Term: Continue to assess and modify interventions until short term weight is achieved;Weight Maintenance: Understanding of the daily nutrition guidelines, which includes 25-35% calories  from fat, 7% or less cal from saturated fats, less than 251m cholesterol, less than 1.5gm of sodium, & 5 or more servings of fruits and vegetables daily;Weight Loss: Understanding of general recommendations for a balanced deficit meal plan, which promotes 1-2 lb weight loss per week and includes a negative energy balance of [phone] kcal/d;Understanding recommendations for meals to include 15-35% energy as protein, 25-35% energy from fat, 35-60% energy from carbohydrates, less than 2051mof dietary cholesterol, 20-35 gm of total fiber daily;Understanding of distribution of calorie intake throughout the day with the consumption of 4-5 meals/snacks    Intervention  Provide education, individualized exercise plan and daily activity instruction to help decrease symptoms of SOB with activities of daily living.    Expected Outcomes  Short Term: Improve cardiorespiratory fitness to achieve a reduction of symptoms when performing ADLs;Long Term: Be able to perform more ADLs without symptoms or delay the onset of symptoms    Diabetes  Yes    Intervention  Provide education about signs/symptoms and action to take for hypo/hyperglycemia.;Provide education about proper nutrition, including hydration, and aerobic/resistive exercise prescription along with prescribed medications to achieve blood glucose in normal ranges: Fasting glucose 65-99 mg/dL    Expected Outcomes  Short Term: Participant verbalizes understanding of the signs/symptoms and immediate care of hyper/hypoglycemia, proper foot care and importance of medication, aerobic/resistive exercise and nutrition plan for blood glucose control.;Long Term: Attainment of HbA1C < 7%.    Hypertension  Yes    Intervention  Provide education on lifestyle  modifcations including regular physical activity/exercise, weight management, moderate sodium restriction and increased consumption of fresh fruit, vegetables, and low fat dairy, alcohol moderation, and smoking cessation.;Monitor prescription use compliance.       Education:Diabetes - Individual verbal and written instruction to review signs/symptoms of diabetes, desired ranges of glucose level fasting, after meals and with exercise. Acknowledge that pre and post exercise glucose checks will be done for 3 sessions at entry of program.   [facility]  Date  05/01/19  Educator  AS  Instruction Review Code  1- Verbalizes Understanding      Education: Know Your Numbers and Risk Factors: -Group verbal and written instruction about important numbers in your health.  Discussion of what are risk factors and how they play a role in the disease process.  Review of Cholesterol, Blood Pressure, Diabetes, and BMI and the role they play in your overall health.   Core Components/Risk Factors/Patient Goals Review:  Goals and Risk Factor Review    Row Name 05/24/19 1602             Core Components/Risk Factors/Patient Goals Review   Personal Goals Review  Weight Management/Obesity;Develop more efficient breathing techniques such as purse lipped breathing and diaphragmatic breathing and practicing self-pacing with activity.;Hypertension;Improve shortness of breath with ADL's       Review  Pt reports talking medication as directed, does not take BP at home, but has a cuff; encouraged to take daily at least when not at rehab. Pt reports ADL is hard, but manageable.       Expected Outcomes  ST: take BP regularly.          Core Components/Risk Factors/Patient Goals at Discharge (Final Review):  Goals and Risk Factor Review - 05/24/19 1602      Core Components/Risk Factors/Patient Goals Review   Personal Goals Review  Weight  Management/Obesity;Develop more efficient breathing techniques such as purse lipped breathing and  diaphragmatic breathing and practicing self-pacing with activity.;Hypertension;Improve shortness of breath with ADL's    Review  Pt reports talking medication as directed, does not take BP at home, but has a cuff; encouraged to take daily at least when not at rehab. Pt reports ADL is hard, but manageable.    Expected Outcomes  ST: take BP regularly.       ITP Comments: ITP Comments    Row Name 04/27/19 1114 05/03/19 0614 05/03/19 1642 05/25/19 1341 05/31/19 0541   ITP Comments  Virtual Orientation performed. Patient informed when to come in for RD and EP orientation. Diagnosis can be found in [facility] 04/20/2019  30 day chart review completed. ITP sent to Dr Zachery Dakins Medical Director, for review,changes as needed and signature. Continue with ITP if no changes requested  First full day of exercise!  Patient was oriented to gym and equipment including functions, settings, policies, and procedures.  Patient's individual exercise prescription and treatment plan were reviewed.  All starting workloads were established based on the results of the 6 minute walk test done at initial orientation visit.  The plan for exercise progression was also introduced and progression will be customized based on patient's performance and goals.  Completed Initial RD Eval  30 Day review completed. Medical Director review done, changes made as directed,and approval shown by signature of Market researcher.      Comments:

## 2019-06-05 ENCOUNTER — Encounter: Payer: Medicare HMO | Admitting: *Deleted

## 2019-06-05 ENCOUNTER — Other Ambulatory Visit: Payer: Self-pay

## 2019-06-05 DIAGNOSIS — J449 Chronic obstructive pulmonary disease, unspecified: Secondary | ICD-10-CM | POA: Diagnosis not present

## 2019-06-05 NOTE — Progress Notes (Signed)
Daily Session Note  Patient Details  Name: Lori Browning MRN: [id number] Date of Birth: 1930-06-29 Referring Provider:     Pulmonary Rehab from 05/01/2019 in [facility] Cardiac and Pulmonary Rehab  Referring Provider  [name]      Encounter Date: 06/05/2019  Check In: Session Check In - 06/05/19 1530      Check-In   Supervising physician immediately available to respond to emergencies  See telemetry face sheet for immediately available ER MD    Location  [facility]-Cardiac & Pulmonary Rehab    Staff Present  Renita Papa, RN Moises Blood, BS, ACSM CEP, Exercise Physiologist;Joseph Tessie Fass RCP,RRT,BSRT    Virtual Visit  No    Medication changes reported      No    Fall or balance concerns reported     No    Warm-up and Cool-down  Performed on first and last piece of equipment    Resistance Training Performed  Yes    VAD Patient?  No    PAD/SET Patient?  No      Pain Assessment   Currently in Pain?  No/denies          Social History   Tobacco Use  Smoking Status Never Smoker  Smokeless Tobacco Never Used    Goals Met:  Independence with exercise equipment Exercise tolerated well No report of cardiac concerns or symptoms Strength training completed today  Goals Unmet:  Not Applicable  Comments: Pt able to follow exercise prescription today without complaint.  Will continue to monitor for progression.    Dr. Emily Filbert is Medical Director for [place] and [facility].

## 2019-06-07 ENCOUNTER — Other Ambulatory Visit: Payer: Self-pay

## 2019-06-07 ENCOUNTER — Encounter: Payer: Medicare HMO | Admitting: *Deleted

## 2019-06-07 DIAGNOSIS — J449 Chronic obstructive pulmonary disease, unspecified: Secondary | ICD-10-CM | POA: Diagnosis not present

## 2019-06-07 NOTE — Progress Notes (Signed)
Daily Session Note  Patient Details  Name: Lori Browning MRN: [id number] Date of Birth: 1930-07-20 Referring Provider:     Pulmonary Rehab from 05/01/2019 in [facility] Cardiac and Pulmonary Rehab  Referring Provider  [name]      Encounter Date: 06/07/2019  Check In: Session Check In - 06/07/19 1539      Check-In   Supervising physician immediately available to respond to emergencies  See telemetry face sheet for immediately available ER MD    Location  [facility]-Cardiac & Pulmonary Rehab    Staff Present  Renita Papa, RN BSN;Melissa Caiola RDN, Rowe Pavy, BA, ACSM CEP, Exercise Physiologist    Virtual Visit  No    Medication changes reported      No    Fall or balance concerns reported     No    Warm-up and Cool-down  Performed on first and last piece of equipment    Resistance Training Performed  Yes    VAD Patient?  No    PAD/SET Patient?  No      Pain Assessment   Currently in Pain?  No/denies          Social History   Tobacco Use  Smoking Status Never Smoker  Smokeless Tobacco Never Used    Goals Met:  Independence with exercise equipment Exercise tolerated well No report of cardiac concerns or symptoms Strength training completed today  Goals Unmet:  Not Applicable  Comments: Pt able to follow exercise prescription today without complaint.  Will continue to monitor for progression.    Dr. Emily Filbert is Medical Director for [place] and [facility].

## 2019-06-12 ENCOUNTER — Other Ambulatory Visit: Payer: Self-pay

## 2019-06-12 ENCOUNTER — Encounter: Payer: Medicare HMO | Attending: Pulmonary Disease | Admitting: *Deleted

## 2019-06-12 DIAGNOSIS — J449 Chronic obstructive pulmonary disease, unspecified: Secondary | ICD-10-CM | POA: Diagnosis not present

## 2019-06-12 NOTE — Progress Notes (Signed)
Daily Session Note  Patient Details  Name: Lori Browning MRN: [id number] Date of Birth: 05-Feb-1931 Referring Provider:     Pulmonary Rehab from 05/01/2019 in [facility] Cardiac and Pulmonary Rehab  Referring Provider  [name]      Encounter Date: 06/12/2019  Check In: Session Check In - 06/12/19 1531      Check-In   Supervising physician immediately available to respond to emergencies  See telemetry face sheet for immediately available ER MD    Location  [facility]-Cardiac & Pulmonary Rehab    Staff Present  Renita Papa, RN BSN;Joseph [address] [place], [place], ACSM CEP, Exercise Physiologist;Jessica [place], [place], RCEP, CCRP, CCET    Virtual Visit  No    Medication changes reported      No    Fall or balance concerns reported     No    Warm-up and Cool-down  Performed on first and last piece of equipment    Resistance Training Performed  Yes    VAD Patient?  No    PAD/SET Patient?  No      Pain Assessment   Currently in Pain?  No/denies          Social History   Tobacco Use  Smoking Status Never Smoker  Smokeless Tobacco Never Used    Goals Met:  Independence with exercise equipment Exercise tolerated well No report of cardiac concerns or symptoms Strength training completed today  Goals Unmet:  Not Applicable  Comments: Pt able to follow exercise prescription today without complaint.  Will continue to monitor for progression.    Dr. Emily Filbert is Medical Director for [place] and [facility].

## 2019-06-14 ENCOUNTER — Other Ambulatory Visit: Payer: Self-pay

## 2019-06-14 ENCOUNTER — Encounter: Payer: Medicare HMO | Admitting: *Deleted

## 2019-06-14 DIAGNOSIS — J449 Chronic obstructive pulmonary disease, unspecified: Secondary | ICD-10-CM | POA: Diagnosis not present

## 2019-06-14 NOTE — Progress Notes (Signed)
Daily Session Note  Patient Details  Name: SUMAN TRIVEDI MRN: [id number] Date of Birth: 1930-09-13 Referring Provider:     Pulmonary Rehab from 05/01/2019 in [facility] Cardiac and Pulmonary Rehab  Referring Provider  [name]      Encounter Date: 06/14/2019  Check In: Session Check In - 06/14/19 1524      Check-In   Supervising physician immediately available to respond to emergencies  See telemetry face sheet for immediately available ER MD    Location  [facility]-Cardiac & Pulmonary Rehab    Staff Present  Renita Papa, RN BSN;Melissa Caiola RDN, Rowe Pavy, BA, ACSM CEP, Exercise Physiologist    Virtual Visit  No    Medication changes reported      No    Fall or balance concerns reported     No    Warm-up and Cool-down  Performed on first and last piece of equipment    Resistance Training Performed  Yes    VAD Patient?  No    PAD/SET Patient?  No      Pain Assessment   Currently in Pain?  No/denies          Social History   Tobacco Use  Smoking Status Never Smoker  Smokeless Tobacco Never Used    Goals Met:  Independence with exercise equipment Exercise tolerated well No report of cardiac concerns or symptoms Strength training completed today  Goals Unmet:  Not Applicable  Comments: Pt able to follow exercise prescription today without complaint.  Will continue to monitor for progression.    Dr. Emily Filbert is Medical Director for [place] and [facility].

## 2019-06-19 ENCOUNTER — Encounter: Payer: Medicare HMO | Admitting: *Deleted

## 2019-06-19 ENCOUNTER — Other Ambulatory Visit: Payer: Self-pay

## 2019-06-19 DIAGNOSIS — J449 Chronic obstructive pulmonary disease, unspecified: Secondary | ICD-10-CM

## 2019-06-19 NOTE — Progress Notes (Signed)
Daily Session Note  Patient Details  Name: Lori Browning MRN: [id number] Date of Birth: December 01, 1930 Referring Provider:     Pulmonary Rehab from 05/01/2019 in [facility] Cardiac and Pulmonary Rehab  Referring Provider  [name]      Encounter Date: 06/19/2019  Check In: Session Check In - 06/19/19 1539      Check-In   Supervising physician immediately available to respond to emergencies  See telemetry face sheet for immediately available ER MD    Location  [facility]-Cardiac & Pulmonary Rehab    Staff Present  Renita Papa, RN BSN;Joseph [address] [place], [place], [employer], CCRP, CCET    Virtual Visit  No    Medication changes reported      No    Fall or balance concerns reported     No    Warm-up and Cool-down  Performed on first and last piece of equipment    Resistance Training Performed  Yes    VAD Patient?  No    PAD/SET Patient?  No      Pain Assessment   Currently in Pain?  No/denies          Social History   Tobacco Use  Smoking Status Never Smoker  Smokeless Tobacco Never Used    Goals Met:  Independence with exercise equipment Exercise tolerated well No report of cardiac concerns or symptoms Strength training completed today  Goals Unmet:  Not Applicable  Comments: Pt able to follow exercise prescription today without complaint.  Will continue to monitor for progression.    Dr. Emily Filbert is Medical Director for [place] and [facility].

## 2019-06-21 ENCOUNTER — Other Ambulatory Visit: Payer: Self-pay

## 2019-06-21 ENCOUNTER — Encounter: Payer: Medicare HMO | Admitting: *Deleted

## 2019-06-21 DIAGNOSIS — J449 Chronic obstructive pulmonary disease, unspecified: Secondary | ICD-10-CM | POA: Diagnosis not present

## 2019-06-21 NOTE — Progress Notes (Signed)
Daily Session Note  Patient Details  Name: ARSHIYA JAKES MRN: [id number] Date of Birth: 1930/08/20 Referring Provider:     Pulmonary Rehab from 05/01/2019 in [facility] Cardiac and Pulmonary Rehab  Referring Provider  [name]      Encounter Date: 06/21/2019  Check In: Session Check In - 06/21/19 1530      Check-In   Supervising physician immediately available to respond to emergencies  See telemetry face sheet for immediately available ER MD    Location  [facility]-Cardiac & Pulmonary Rehab    Staff Present  Alberteen Sam, MA, RCEP, CCRP, CCET;Amanda Sommer, BA, ACSM CEP, Exercise Physiologist;Suellyn Meenan Sherryll Burger, RN BSN;Melissa Caiola RDN, LDN    Virtual Visit  No    Medication changes reported      No    Fall or balance concerns reported     No    Warm-up and Cool-down  Performed on first and last piece of equipment    Resistance Training Performed  Yes    VAD Patient?  No    PAD/SET Patient?  No      Pain Assessment   Currently in Pain?  No/denies          Social History   Tobacco Use  Smoking Status Never Smoker  Smokeless Tobacco Never Used    Goals Met:  Independence with exercise equipment Exercise tolerated well No report of cardiac concerns or symptoms Strength training completed today  Goals Unmet:  Not Applicable  Comments: Pt able to follow exercise prescription today without complaint.  Will continue to monitor for progression.    Dr. Emily Filbert is Medical Director for [place] and [facility].

## 2019-06-26 ENCOUNTER — Encounter: Payer: Medicare HMO | Admitting: *Deleted

## 2019-06-26 ENCOUNTER — Other Ambulatory Visit: Payer: Self-pay

## 2019-06-26 DIAGNOSIS — J449 Chronic obstructive pulmonary disease, unspecified: Secondary | ICD-10-CM

## 2019-06-26 NOTE — Progress Notes (Signed)
Daily Session Note  Patient Details  Name: Lori Browning MRN: [id number] Date of Birth: Jul 26, 1930 Referring Provider:     Pulmonary Rehab from 05/01/2019 in [facility] Cardiac and Pulmonary Rehab  Referring Provider  [name]      Encounter Date: 06/26/2019  Check In: Session Check In - 06/26/19 1528      Check-In   Supervising physician immediately available to respond to emergencies  See telemetry face sheet for immediately available ER MD    Location  [facility]-Cardiac & Pulmonary Rehab    Staff Present  Renita Papa, RN BSN;Joseph [address] [place], [place], ACSM CEP, Exercise Physiologist    Virtual Visit  No    Medication changes reported      No    Fall or balance concerns reported     No    Warm-up and Cool-down  Performed on first and last piece of equipment    Resistance Training Performed  Yes    VAD Patient?  No    PAD/SET Patient?  No      Pain Assessment   Currently in Pain?  No/denies          Social History   Tobacco Use  Smoking Status Never Smoker  Smokeless Tobacco Never Used    Goals Met:  Independence with exercise equipment Exercise tolerated well No report of cardiac concerns or symptoms Strength training completed today  Goals Unmet:  Not Applicable  Comments: Pt able to follow exercise prescription today without complaint.  Will continue to monitor for progression.    Dr. Emily Filbert is Medical Director for [place] and [facility].

## 2019-06-28 ENCOUNTER — Encounter: Payer: Medicare HMO | Admitting: *Deleted

## 2019-06-28 ENCOUNTER — Other Ambulatory Visit: Payer: Self-pay

## 2019-06-28 ENCOUNTER — Encounter: Payer: Self-pay | Admitting: *Deleted

## 2019-06-28 DIAGNOSIS — J449 Chronic obstructive pulmonary disease, unspecified: Secondary | ICD-10-CM

## 2019-06-28 NOTE — Progress Notes (Signed)
Pulmonary Individual Treatment Plan  Patient Details  Name: XIAMARA HULET MRN: [id number] Date of Birth: [birth year] Referring Provider:     Pulmonary Rehab from 05/01/2019 in [facility] Cardiac and Pulmonary Rehab  Referring Provider  Aleskerov      Initial Encounter Date:    Pulmonary Rehab from 05/01/2019 in [facility] Cardiac and Pulmonary Rehab  Date  05/01/19      Visit Diagnosis: Chronic obstructive pulmonary disease, unspecified COPD type ([place])  Patient's Home Medications on Admission:  Current Outpatient Medications:  .  albuterol (PROVENTIL) (2.5 MG/3ML) 0.083% nebulizer solution, Take 2.5 mg by nebulization every 6 (six) hours as needed for wheezing or shortness of breath., Disp: , Rfl:  .  carvedilol (COREG) 25 MG tablet, Take 25 mg by mouth 2 (two) times daily with a meal., Disp: , Rfl:  .  Cranberry 500 MG CAPS, Take 1 tablet by mouth daily. , Disp: , Rfl:  .  furosemide (LASIX) 40 MG tablet, Take 1 tablet (40 mg total) by mouth daily., Disp: 30 tablet, Rfl: 0 .  pravastatin (PRAVACHOL) 40 MG tablet, Take 40 mg by mouth daily., Disp: , Rfl:  .  spironolactone (ALDACTONE) 25 MG tablet, Take 1 tablet (25 mg total) by mouth daily., Disp: 30 tablet, Rfl: 0  Past Medical History: Past Medical History:  Diagnosis Date  . CHF (congestive heart failure) ([place])   . COPD (chronic obstructive pulmonary disease) ([place])   . Hyperlipemia   . Hypertension   . Urge incontinence     Tobacco Use: Social History   Tobacco Use  Smoking Status Never Smoker  Smokeless Tobacco Never Used    Labs: Recent Review Flowsheet Data    There is no flowsheet data to display.       Pulmonary Assessment Scores: Pulmonary Assessment Scores    Row Name 05/01/19 1426         ADL UCSD   SOB Score total  30     Rest  1     Walk  3     Stairs  4     Bath  2     Dress  2     Shop  3       CAT Score   CAT Score  25       mMRC Score   mMRC Score  3        UCSD: Self-administered  rating of dyspnea associated with activities of daily living (ADLs) 6-point scale (0 = "not at all" to 5 = "maximal or unable to do because of breathlessness")  Scoring Scores range from 0 to 120.  Minimally important difference is 5 units  CAT: CAT can identify the health impairment of COPD patients and is better correlated with disease progression.  CAT has a scoring range of zero to 40. The CAT score is classified into four groups of low (less than 10), medium (10 - 20), high (21-30) and very high (31-40) based on the impact level of disease on health status. A CAT score over 10 suggests significant symptoms.  A worsening CAT score could be explained by an exacerbation, poor medication adherence, poor inhaler technique, or progression of COPD or comorbid conditions.  CAT MCID is 2 points  mMRC: mMRC (Modified Medical Research Council) Dyspnea Scale is used to assess the degree of baseline functional disability in patients of respiratory disease due to dyspnea. No minimal important difference is established. A decrease in score of 1 point or greater is considered  a positive change.   Pulmonary Function Assessment: Pulmonary Function Assessment - 04/27/19 1109      Breath   Shortness of Breath  No;Limiting activity       Exercise Target Goals: Exercise Program Goal: Individual exercise prescription set using results from initial 6 min walk test and THRR while considering  patient's activity barriers and safety.   Exercise Prescription Goal: Initial exercise prescription builds to 30-45 minutes a day of aerobic activity, 2-3 days per week.  Home exercise guidelines will be given to patient during program as part of exercise prescription that the participant will acknowledge.  Education: Aerobic Exercise & Resistance Training: - Gives group verbal and written instruction on the various components of exercise. Focuses on aerobic and resistive training programs and the benefits of this  training and how to safely progress through these programs..   Education: Exercise & Equipment Safety: - Individual verbal instruction and demonstration of equipment use and safety with use of the equipment.   Pulmonary Rehab from 05/01/2019 in [facility] Cardiac and Pulmonary Rehab  Date  05/01/19  Educator  AS  Instruction Review Code  1- Verbalizes Understanding      Education: Exercise Physiology & General Exercise Guidelines: - Group verbal and written instruction with models to review the exercise physiology of the cardiovascular system and associated critical values. Provides general exercise guidelines with specific guidelines to those with heart or lung disease.    Education: Flexibility, Balance, Mind/Body Relaxation: Provides group verbal/written instruction on the benefits of flexibility and balance training, including mind/body exercise modes such as yoga, pilates and tai chi.  Demonstration and skill practice provided.   Activity Barriers & Risk Stratification:   6 Minute Walk: 6 Minute Walk    Row Name 05/01/19 1409         6 Minute Walk   Phase  Initial     Distance  600 feet     Walk Time  5 minutes     # of Rest Breaks  2     MPH  1.36     METS  1.77     RPE  15     Perceived Dyspnea   3     VO2 Peak  1.25     Symptoms  Yes (comment)     Comments  shortness of breath     Resting HR  65 bpm     Resting BP  124/74     Resting Oxygen Saturation   96 %     Exercise Oxygen Saturation  during 6 min walk  90 %     Max Ex. HR  96 bpm     Max Ex. BP  164/76     2 Minute Post BP  152/78       Interval HR   1 Minute HR  83     2 Minute HR  91     3 Minute HR  94     4 Minute HR  80 resting     5 Minute HR  93     6 Minute HR  96     2 Minute Post HR  70     Interval Heart Rate?  Yes       Interval Oxygen   Interval Oxygen?  Yes     1 Minute Oxygen Saturation %  92 %     1 Minute Liters of Oxygen  0 L     2 Minute Oxygen Saturation %  90 %     2 Minute  Liters of Oxygen  0 L     3 Minute Oxygen Saturation %  90 %     3 Minute Liters of Oxygen  0 L     4 Minute Oxygen Saturation %  90 %     4 Minute Liters of Oxygen  0 L     5 Minute Oxygen Saturation %  92 %     5 Minute Liters of Oxygen  0 L     6 Minute Oxygen Saturation %  90 %     6 Minute Liters of Oxygen  0 L     2 Minute Post Oxygen Saturation %  96 %     2 Minute Post Liters of Oxygen  0 L       Oxygen Initial Assessment: Oxygen Initial Assessment - 04/27/19 1109      Home Oxygen   Home Oxygen Device  None    Sleep Oxygen Prescription  None    Home Exercise Oxygen Prescription  None    Home at Rest Exercise Oxygen Prescription  None      Initial 6 min Walk   Oxygen Used  None      Program Oxygen Prescription   Program Oxygen Prescription  None      Intervention   Short Term Goals  To learn and understand importance of monitoring SPO2 with pulse oximeter and demonstrate accurate use of the pulse oximeter.;To learn and understand importance of maintaining oxygen saturations>88%;To learn and demonstrate proper pursed lip breathing techniques or other breathing techniques.;To learn and demonstrate proper use of respiratory medications    Long  Term Goals  Verbalizes importance of monitoring SPO2 with pulse oximeter and return demonstration;Maintenance of O2 saturations>88%;Exhibits proper breathing techniques, such as pursed lip breathing or other method taught during program session;Compliance with respiratory medication;Demonstrates proper use of MDI's       Oxygen Re-Evaluation: Oxygen Re-Evaluation    Row Name 05/03/19 1643 06/14/19 1547           Program Oxygen Prescription   Program Oxygen Prescription  None  None        Home Oxygen   Home Oxygen Device  None  None      Sleep Oxygen Prescription  None  None      Home Exercise Oxygen Prescription  None  None      Home at Rest Exercise Oxygen Prescription  None  None      Compliance with Home Oxygen Use  Yes   Yes        Goals/Expected Outcomes   Short Term Goals  To learn and understand importance of maintaining oxygen saturations>88%;To learn and understand importance of monitoring SPO2 with pulse oximeter and demonstrate accurate use of the pulse oximeter.;To learn and demonstrate proper pursed lip breathing techniques or other breathing techniques.  To learn and understand importance of maintaining oxygen saturations>88%;To learn and understand importance of monitoring SPO2 with pulse oximeter and demonstrate accurate use of the pulse oximeter.;To learn and demonstrate proper pursed lip breathing techniques or other breathing techniques.      Long  Term Goals  Verbalizes importance of monitoring SPO2 with pulse oximeter and return demonstration;Maintenance of O2 saturations>88%;Exhibits proper breathing techniques, such as pursed lip breathing or other method taught during program session  Verbalizes importance of monitoring SPO2 with pulse oximeter and return demonstration;Maintenance of O2 saturations>88%;Exhibits proper breathing techniques, such as pursed lip breathing or other method  taught during program session      Comments  Reviewed PLB technique with pt.  Talked about how it works and it's importance in maintaining their exercise saturations.  MAintianing Exercise saturations above 88% wether on oxygen or not  Reviewed PLB technique with pt.  Talked about how it works and it's importance in maintaining their exercise saturations.  MAintianing Exercise saturations above 88% wether on oxygen or not      Goals/Expected Outcomes  Short: Become more profiecient at using PLB and being able to monitor pulse oximetry readings  Long: Become independent at using PLB.  Short: Become more profiecient at using PLB and being able to monitor pulse oximetry readings  Long: Become independent at using PLB.         Oxygen Discharge (Final Oxygen Re-Evaluation): Oxygen Re-Evaluation - 06/14/19 1547      Program  Oxygen Prescription   Program Oxygen Prescription  None      Home Oxygen   Home Oxygen Device  None    Sleep Oxygen Prescription  None    Home Exercise Oxygen Prescription  None    Home at Rest Exercise Oxygen Prescription  None    Compliance with Home Oxygen Use  Yes      Goals/Expected Outcomes   Short Term Goals  To learn and understand importance of maintaining oxygen saturations>88%;To learn and understand importance of monitoring SPO2 with pulse oximeter and demonstrate accurate use of the pulse oximeter.;To learn and demonstrate proper pursed lip breathing techniques or other breathing techniques.    Long  Term Goals  Verbalizes importance of monitoring SPO2 with pulse oximeter and return demonstration;Maintenance of O2 saturations>88%;Exhibits proper breathing techniques, such as pursed lip breathing or other method taught during program session    Comments  Reviewed PLB technique with pt.  Talked about how it works and it's importance in maintaining their exercise saturations.  MAintianing Exercise saturations above 88% wether on oxygen or not    Goals/Expected Outcomes  Short: Become more profiecient at using PLB and being able to monitor pulse oximetry readings  Long: Become independent at using PLB.       Initial Exercise Prescription: Initial Exercise Prescription - 05/01/19 1400      Date of Initial Exercise RX and Referring Provider   Date  05/01/19    Referring Provider  Aleskerov      Treadmill   MPH  0.8    Grade  0    Minutes  15    METs  1      NuStep   Level  1    SPM  80    Minutes  15    METs  1      REL-XR   Level  1    Speed  50    Minutes  15    METs  1      T5 Nustep   Level  1    SPM  80    Minutes  15      Prescription Details   Frequency (times per week)  3    Duration  Progress to 30 minutes of continuous aerobic without signs/symptoms of physical distress      Intensity   THRR 40-80% of Max Heartrate  93-120    Ratings of  Perceived Exertion  11-13    Perceived Dyspnea  0-4      Resistance Training   Training Prescription  Yes    Weight  3 lb  Reps  10-15       Perform Capillary Blood Glucose checks as needed.  Exercise Prescription Changes: Exercise Prescription Changes    Row Name 05/01/19 1400 05/08/19 1400 05/24/19 1100 05/29/19 1700 06/06/19 1400     Response to Exercise   Blood Pressure (Admit)  124/74  138/64  112/64  --  144/84   Blood Pressure (Exercise)  164/76  170/74  142/62  --  178/80   Blood Pressure (Exit)  152/78  130/68  120/62  --  122/68   Heart Rate (Admit)  65 bpm  88 bpm  69 bpm  --  61 bpm   Heart Rate (Exercise)  96 bpm  76 bpm  78 bpm  --  94 bpm   Heart Rate (Exit)  70 bpm  62 bpm  69 bpm  --  69 bpm   Oxygen Saturation (Admit)  96 %  88 %  94 %  --  92 %   Oxygen Saturation (Exercise)  90 %  92 %  91 %  --  91 %   Oxygen Saturation (Exit)  96 %  92 %  93 %  --  87 %   Rating of Perceived Exertion (Exercise)  15  11  15   --  15   Perceived Dyspnea (Exercise)  3  --  2  --  2   Symptoms  shortness of breath  SOB  --  --  SOB   Comments  --  first full day of exercise  --  --  --   Duration  --  Progress to 30 minutes of  aerobic without signs/symptoms of physical distress  Progress to 30 minutes of  aerobic without signs/symptoms of physical distress  --  Continue with 30 min of aerobic exercise without signs/symptoms of physical distress.   Intensity  --  THRR unchanged  THRR unchanged  --  THRR unchanged     Progression   Progression  --  Continue to progress workloads to maintain intensity without signs/symptoms of physical distress.  Continue to progress workloads to maintain intensity without signs/symptoms of physical distress.  --  Continue to progress workloads to maintain intensity without signs/symptoms of physical distress.   Average METs  --  1.65  1.6  --  2.1     Resistance Training   Training Prescription  --  Yes  Yes  --  Yes   Weight  --  3 lb  3 lb   --  3 lb   Reps  --  10-15  10-15  --  10-15     Interval Training   Interval Training  --  No  No  --  No     Treadmill   MPH  --  0.8  0.8  --  1.1   Grade  --  0  0  --  0   Minutes  --  15  15  --  15   METs  --  1.6  1.6  --  1.6     NuStep   Level  --  --  --  --  5   Minutes  --  --  --  --  15   METs  --  --  --  --  2.6     T5 Nustep   Level  --  1  1  --  --   SPM  --  --  80  --  --  Minutes  --  15  15  --  --   METs  --  1.7  --  --  --     Home Exercise Plan   Plans to continue exercise at  --  --  --  Home (comment) walk and stationary bike  Home (comment) walk and stationary bike   Frequency  --  --  --  Add 2 additional days to program exercise sessions.  Add 2 additional days to program exercise sessions.   Initial Home Exercises Provided  --  --  --  05/29/19  05/29/19   Row Name 06/15/19 1400             Response to Exercise   Blood Pressure (Admit)  138/66       Blood Pressure (Exercise)  146/70       Blood Pressure (Exit)  116/62       Heart Rate (Admit)  77 bpm       Heart Rate (Exercise)  77 bpm       Heart Rate (Exit)  65 bpm       Oxygen Saturation (Admit)  90 %       Oxygen Saturation (Exercise)  88 %       Oxygen Saturation (Exit)  92 %       Rating of Perceived Exertion (Exercise)  13       Perceived Dyspnea (Exercise)  2       Duration  Continue with 30 min of aerobic exercise without signs/symptoms of physical distress.       Intensity  THRR unchanged         Progression   Progression  Continue to progress workloads to maintain intensity without signs/symptoms of physical distress.       Average METs  2         Resistance Training   Training Prescription  Yes       Weight  3 lb       Reps  10-15         Interval Training   Interval Training  No         NuStep   Level  5       SPM  80       Minutes  15       METs  2.5         Home Exercise Plan   Plans to continue exercise at  Home (comment) walk and stationary bike        Frequency  Add 2 additional days to program exercise sessions.       Initial Home Exercises Provided  05/29/19          Exercise Comments: Exercise Comments    Row Name 05/03/19 1642           Exercise Comments  First full day of exercise!  Patient was oriented to gym and equipment including functions, settings, policies, and procedures.  Patient's individual exercise prescription and treatment plan were reviewed.  All starting workloads were established based on the results of the 6 minute walk test done at initial orientation visit.  The plan for exercise progression was also introduced and progression will be customized based on patient's performance and goals.          Exercise Goals and Review: Exercise Goals    Row Name 05/01/19 1422             Exercise Goals   Increase  Physical Activity  Yes       Intervention  Provide advice, education, support and counseling about physical activity/exercise needs.;Develop an individualized exercise prescription for aerobic and resistive training based on initial evaluation findings, risk stratification, comorbidities and participant's personal goals.       Expected Outcomes  Short Term: Attend rehab on a regular basis to increase amount of physical activity.;Long Term: Add in home exercise to make exercise part of routine and to increase amount of physical activity.;Long Term: Exercising regularly at least 3-5 days a week.       Increase Strength and Stamina  Yes       Intervention  Provide advice, education, support and counseling about physical activity/exercise needs.       Expected Outcomes  Short Term: Increase workloads from initial exercise prescription for resistance, speed, and METs.;Short Term: Perform resistance training exercises routinely during rehab and add in resistance training at home;Long Term: Improve cardiorespiratory fitness, muscular endurance and strength as measured by increased METs and functional capacity (6MWT)        Able to understand and use rate of perceived exertion (RPE) scale  Yes       Intervention  Provide education and explanation on how to use RPE scale       Expected Outcomes  Short Term: Able to use RPE daily in rehab to express subjective intensity level;Long Term:  Able to use RPE to guide intensity level when exercising independently       Able to understand and use Dyspnea scale  Yes       Intervention  Provide education and explanation on how to use Dyspnea scale       Expected Outcomes  Short Term: Able to use Dyspnea scale daily in rehab to express subjective sense of shortness of breath during exertion;Long Term: Able to use Dyspnea scale to guide intensity level when exercising independently       Knowledge and understanding of Target Heart Rate Range (THRR)  Yes       Intervention  Provide education and explanation of THRR including how the numbers were predicted and where they are located for reference       Expected Outcomes  Short Term: Able to use daily as guideline for intensity in rehab;Long Term: Able to use THRR to govern intensity when exercising independently;Short Term: Able to state/look up THRR       Able to check pulse independently  Yes       Intervention  Provide education and demonstration on how to check pulse in carotid and radial arteries.;Review the importance of being able to check your own pulse for safety during independent exercise       Expected Outcomes  Short Term: Able to explain why pulse checking is important during independent exercise;Long Term: Able to check pulse independently and accurately       Understanding of Exercise Prescription  Yes       Intervention  Provide education, explanation, and written materials on patient's individual exercise prescription       Expected Outcomes  Short Term: Able to explain program exercise prescription;Long Term: Able to explain home exercise prescription to exercise independently          Exercise Goals  Re-Evaluation : Exercise Goals Re-Evaluation    Row Name 05/03/19 1642 05/24/19 1112 05/24/19 1545 05/29/19 1741 06/06/19 1417     Exercise Goal Re-Evaluation   Exercise Goals Review  Able to understand and use rate of perceived exertion (  RPE) scale;Able to understand and use Dyspnea scale;Knowledge and understanding of Target Heart Rate Range (THRR);Understanding of Exercise Prescription  Increase Physical Activity;Increase Strength and Stamina;Able to understand and use rate of perceived exertion (RPE) scale;Able to understand and use Dyspnea scale;Knowledge and understanding of Target Heart Rate Range (THRR);Able to check pulse independently;Understanding of Exercise Prescription  Increase Physical Activity;Increase Strength and Stamina;Able to understand and use rate of perceived exertion (RPE) scale;Able to understand and use Dyspnea scale;Knowledge and understanding of Target Heart Rate Range (THRR);Able to check pulse independently;Understanding of Exercise Prescription  Increase Physical Activity;Increase Strength and Stamina;Able to understand and use rate of perceived exertion (RPE) scale;Able to understand and use Dyspnea scale;Knowledge and understanding of Target Heart Rate Range (THRR);Able to check pulse independently;Understanding of Exercise Prescription  Increase Physical Activity;Increase Strength and Stamina;Understanding of Exercise Prescription   Comments  Reviewed RPE scale, THR and program prescription with pt today.  Pt voiced understanding and was given a copy of goals to take home.  Kaity has been attending consistently and tolerates exercise well.  Staff willl monitor progress.  Shamarie has ben having knee pain starting last Friday 05/19/19; she has been using ice and it has been feeling better and the swelling has gone down. Jess talked to pt regarding ice and heat as well as exercise.  Reviewed home exercise with pt today.  Pt plans to walk and use stationary bike for exercise.   Reviewed THR, pulse, RPE, sign and symptoms, NTG use, and when to call 911 or MD.  Also discussed weather considerations and indoor options.  Pt voiced understanding.  Amrie is doing well in rehab.  She is now walking the full time on the treadmill at 1.1 mph.  We will continue to monitor her progress.   Expected Outcomes  Short: Use RPE daily to regulate intensity. Long: Follow program prescription in THR.  Short:  continue to attend consistently Long:  increase overall MET level  Short:  continue to attend consistently, ice leg and rest leg until feeling better.  Long:  increase overall MET level  Short: add in a day between Wed and Monday sessions Long: maintain exercise on her own  Short: Continue to increase workloads  Long: Continue to improve stamina.   [place] Name 06/14/19 1541             Exercise Goal Re-Evaluation   Exercise Goals Review  Increase Physical Activity;Increase Strength and Stamina;Understanding of Exercise Prescription       Comments  Milaya has been walking outside on days not at [facility].  She walks about 15 min.  We discussed adding one minute per week until she can do 20-30 min.  We discussed options for continuing to exercise after she finishes LW.       Expected Outcomes  Short:  continue exercise at home Long:  maintain exercise          Discharge Exercise Prescription (Final Exercise Prescription Changes): Exercise Prescription Changes - 06/15/19 1400      Response to Exercise   Blood Pressure (Admit)  138/66    Blood Pressure (Exercise)  146/70    Blood Pressure (Exit)  116/62    Heart Rate (Admit)  77 bpm    Heart Rate (Exercise)  77 bpm    Heart Rate (Exit)  65 bpm    Oxygen Saturation (Admit)  90 %    Oxygen Saturation (Exercise)  88 %    Oxygen Saturation (Exit)  92 %  Rating of Perceived Exertion (Exercise)  13    Perceived Dyspnea (Exercise)  2    Duration  Continue with 30 min of aerobic exercise without signs/symptoms of physical distress.     Intensity  THRR unchanged      Progression   Progression  Continue to progress workloads to maintain intensity without signs/symptoms of physical distress.    Average METs  2      Resistance Training   Training Prescription  Yes    Weight  3 lb    Reps  10-15      Interval Training   Interval Training  No      NuStep   Level  5    SPM  80    Minutes  15    METs  2.5      Home Exercise Plan   Plans to continue exercise at  Home (comment)   walk and stationary bike   Frequency  Add 2 additional days to program exercise sessions.    Initial Home Exercises Provided  05/29/19       Nutrition:  Target Goals: Understanding of nutrition guidelines, daily intake of sodium <1559m, cholesterol <2041m calories 30% from fat and 7% or less from saturated fats, daily to have 5 or more servings of fruits and vegetables.  Education: Controlling Sodium/Reading Food Labels -Group verbal and written material supporting the discussion of sodium use in heart healthy nutrition. Review and explanation with models, verbal and written materials for utilization of the food label.   Education: General Nutrition Guidelines/Fats and Fiber: -Group instruction provided by verbal, written material, models and posters to present the general guidelines for heart healthy nutrition. Gives an explanation and review of dietary fats and fiber.   Biometrics: Pre Biometrics - 05/01/19 1423      Pre Biometrics   Height  5' 6"  (1.676 m)    Weight  236 lb 11.2 oz (107.4 kg)    BMI (Calculated)  38.22    Single Leg Stand  0 seconds        Nutrition Therapy Plan and Nutrition Goals: Nutrition Therapy & Goals - 06/26/19 1619      Personal Nutrition Goals   Comments  She lives at home alone alone and mostly eats out. Breakfast is typically scrambled eggs, sausage, english muffin and black coffee; Lunch: fruit; Dinner: hibachi steak and white rice OR a vegetable plate. Pt does not add salt to food. The park,  [employer], [employer], [employer], [employer], [employer], delancies, etc. tries to watch what she eats when going out. Does not want to stop going out. Discussed HH eating       Nutrition Assessments:   MEDIFICTS Score Key:          ?70 Need to make dietary changes          40-70 Heart Healthy Diet         ? 40 Therapeutic Level Cholesterol Diet  Nutrition Goals Re-Evaluation: Nutrition Goals Re-Evaluation    [place] 06/26/19 1600             Goals   Nutrition Goal  ST: modify take out foods LT: Improve SOB, lose weight       Comment  Review take-out menus       Expected Outcome  ST: modify take out foods LT: Improve SOB, lose weight          Nutrition Goals Discharge (Final Nutrition Goals  Re-Evaluation): Nutrition Goals Re-Evaluation - 06/26/19 1600      Goals   Nutrition Goal  ST: modify take out foods LT: Improve SOB, lose weight    Comment  Review take-out menus    Expected Outcome  ST: modify take out foods LT: Improve SOB, lose weight       Psychosocial: Target Goals: Acknowledge presence or absence of significant depression and/or stress, maximize coping skills, provide positive support system. Participant is able to verbalize types and ability to use techniques and skills needed for reducing stress and depression.   Education: Depression - Provides group verbal and written instruction on the correlation between heart/lung disease and depressed mood, treatment options, and the stigmas associated with seeking treatment.   Education: Sleep Hygiene -Provides group verbal and written instruction about how sleep can affect your health.  Define sleep hygiene, discuss sleep cycles and impact of sleep habits. Review good sleep hygiene tips.    Education: Stress and Anxiety: - Provides group verbal and written instruction about the health risks of elevated stress and causes of high stress.  Discuss the correlation between  heart/lung disease and anxiety and treatment options. Review healthy ways to manage with stress and anxiety.   Initial Review & Psychosocial Screening: Initial Psych Review & Screening - 04/27/19 1110      Initial Review   Current issues with  None Identified      Family Dynamics   Good Support System?  Yes    Comments  She can look to her nephew and brother for support.      Barriers   Psychosocial barriers to participate in program  The patient should benefit from training in stress management and relaxation.;There are no identifiable barriers or psychosocial needs.      Screening Interventions   Interventions  Encouraged to exercise;Provide feedback about the scores to participant;To provide support and resources with identified psychosocial needs    Expected Outcomes  Short Term goal: Utilizing psychosocial counselor, staff and physician to assist with identification of specific Stressors or current issues interfering with healing process. Setting desired goal for each stressor or current issue identified.;Long Term Goal: Stressors or current issues are controlled or eliminated.;Short Term goal: Identification and review with participant of any Quality of Life or Depression concerns found by scoring the questionnaire.;Long Term goal: The participant improves quality of Life and PHQ9 Scores as seen by post scores and/or verbalization of changes       Quality of Life Scores:  Scores of 19 and below usually indicate a poorer quality of life in these areas.  A difference of  2-3 points is a clinically meaningful difference.  A difference of 2-3 points in the total score of the Quality of Life Index has been associated with significant improvement in overall quality of life, self-image, physical symptoms, and general health in studies assessing change in quality of life.  PHQ-9: Recent Review Flowsheet Data    Depression screen [facility] 2/9 05/01/2019   Decreased Interest 0   Down, Depressed,  Hopeless 0   PHQ - 2 Score 0   Altered sleeping 0   Tired, decreased energy 1   Change in appetite 0   Feeling bad or failure about yourself  0   Trouble concentrating 0   Moving slowly or fidgety/restless 0   PHQ-9 Score 1   Difficult doing work/chores Not difficult at all     Interpretation of Total Score  Total Score Depression Severity:  1-4 = Minimal depression,  5-9 = Mild depression, 10-14 = Moderate depression, 15-19 = Moderately severe depression, 20-27 = Severe depression   Psychosocial Evaluation and Intervention: Psychosocial Evaluation - 04/27/19 1113      Psychosocial Evaluation & Interventions   Interventions  Encouraged to exercise with the program and follow exercise prescription    Comments  Patient states that she is not depressed and has a positive outlook on life.    Expected Outcomes  Short: Attend [facility] stress management education to decrease stress. Long: Maintain exercise Post [facility] to keep stress at a minimum.    Continue Psychosocial Services   Follow up required by staff       Psychosocial Re-Evaluation: Psychosocial Re-Evaluation    [place] Name 05/24/19 1601 06/14/19 1539           Psychosocial Re-Evaluation   Current issues with  None Identified  --      Comments  Pt reports no problems.  Yahaira reports no stress cooncerns and she sleeps well most of the time.      Expected Outcomes  Continue to manage stress levels.  Short: conintue to exercise Long: maintain positive outlook      Interventions  Encouraged to attend Pulmonary Rehabilitation for the exercise  --      Continue Psychosocial Services   Follow up required by staff  --         Psychosocial Discharge (Final Psychosocial Re-Evaluation): Psychosocial Re-Evaluation - 06/14/19 1539      Psychosocial Re-Evaluation   Comments  Marthe reports no stress cooncerns and she sleeps well most of the time.    Expected Outcomes  Short: conintue to exercise Long: maintain positive outlook        Education: Education Goals: Education classes will be provided on a weekly basis, covering required topics. Participant will state understanding/return demonstration of topics presented.  Learning Barriers/Preferences: Learning Barriers/Preferences - 04/27/19 1111      Learning Barriers/Preferences   Learning Barriers  None    Learning Preferences  None       General Pulmonary Education Topics:  Infection Prevention: - Provides verbal and written material to individual with discussion of infection control including proper hand washing and proper equipment cleaning during exercise session.   Pulmonary Rehab from 05/01/2019 in [facility] Cardiac and Pulmonary Rehab  Date  05/01/19  Educator  AS  Instruction Review Code  1- Verbalizes Understanding      Falls Prevention: - Provides verbal and written material to individual with discussion of falls prevention and safety.   Pulmonary Rehab from 05/01/2019 in [facility] Cardiac and Pulmonary Rehab  Date  05/01/19  Educator  AS  Instruction Review Code  1- Verbalizes Understanding      Chronic Lung Diseases: - Group verbal and written instruction to review updates, respiratory medications, advancements in procedures and treatments. Discuss use of supplemental oxygen including available portable oxygen systems, continuous and intermittent flow rates, concentrators, personal use and safety guidelines. Review proper use of inhaler and spacers. Provide informative websites for self-education.    Energy Conservation: - Provide group verbal and written instruction for methods to conserve energy, plan and organize activities. Instruct on pacing techniques, use of adaptive equipment and posture/positioning to relieve shortness of breath.   Triggers and Exacerbations: - Group verbal and written instruction to review types of environmental triggers and ways to prevent exacerbations. Discuss weather changes, air quality and the benefits of nasal  washing. Review warning signs and symptoms to help prevent infections. Discuss techniques for effective  airway clearance, coughing, and vibrations.   AED/CPR: - Group verbal and written instruction with the use of models to demonstrate the basic use of the AED with the basic ABC's of resuscitation.   Anatomy and Physiology of the Lungs: - Group verbal and written instruction with the use of models to provide basic lung anatomy and physiology related to function, structure and complications of lung disease.   Anatomy & Physiology of the Heart: - Group verbal and written instruction and models provide basic cardiac anatomy and physiology, with the coronary electrical and arterial systems. Review of Valvular disease and Heart Failure   Cardiac Medications: - Group verbal and written instruction to review commonly prescribed medications for heart disease. Reviews the medication, class of the drug, and side effects.   Other: -Provides group and verbal instruction on various topics (see comments)   Knowledge Questionnaire Score: Knowledge Questionnaire Score - 05/01/19 1425      Knowledge Questionnaire Score   Pre Score  13/18        Core Components/Risk Factors/Patient Goals at Admission: Personal Goals and Risk Factors at Admission - 05/01/19 1423      Core Components/Risk Factors/Patient Goals on Admission    Weight Management  Yes;Weight Loss;Weight Maintenance    Intervention  Weight Management: Develop a combined nutrition and exercise program designed to reach desired caloric intake, while maintaining appropriate intake of nutrient and fiber, sodium and fats, and appropriate energy expenditure required for the weight goal.;Weight Management/Obesity: Establish reasonable short term and long term weight goals.;Weight Management: Provide education and appropriate resources to help participant work on and attain dietary goals.    Admit Weight  236 lb 11.2 oz (107.4 kg)    Goal  Weight: Short Term  225 lb (102.1 kg)    Goal Weight: Long Term  215 lb (97.5 kg)    Expected Outcomes  Long Term: Adherence to nutrition and physical activity/exercise program aimed toward attainment of established weight goal;Short Term: Continue to assess and modify interventions until short term weight is achieved;Weight Maintenance: Understanding of the daily nutrition guidelines, which includes 25-35% calories from fat, 7% or less cal from saturated fats, less than 218m cholesterol, less than 1.5gm of sodium, & 5 or more servings of fruits and vegetables daily;Weight Loss: Understanding of general recommendations for a balanced deficit meal plan, which promotes 1-2 lb weight loss per week and includes a negative energy balance of [id number] kcal/d;Understanding recommendations for meals to include 15-35% energy as protein, 25-35% energy from fat, 35-60% energy from carbohydrates, less than 2017mof dietary cholesterol, 20-35 gm of total fiber daily;Understanding of distribution of calorie intake throughout the day with the consumption of 4-5 meals/snacks    Intervention  Provide education, individualized exercise plan and daily activity instruction to help decrease symptoms of SOB with activities of daily living.    Expected Outcomes  Short Term: Improve cardiorespiratory fitness to achieve a reduction of symptoms when performing ADLs;Long Term: Be able to perform more ADLs without symptoms or delay the onset of symptoms    Diabetes  Yes    Intervention  Provide education about signs/symptoms and action to take for hypo/hyperglycemia.;Provide education about proper nutrition, including hydration, and aerobic/resistive exercise prescription along with prescribed medications to achieve blood glucose in normal ranges: Fasting glucose 65-99 mg/dL    Expected Outcomes  Short Term: Participant verbalizes understanding of the signs/symptoms and immediate care of hyper/hypoglycemia, proper foot care and  importance of medication, aerobic/resistive exercise and nutrition plan for blood  glucose control.;Long Term: Attainment of HbA1C < 7%.    Hypertension  Yes    Intervention  Provide education on lifestyle modifcations including regular physical activity/exercise, weight management, moderate sodium restriction and increased consumption of fresh fruit, vegetables, and low fat dairy, alcohol moderation, and smoking cessation.;Monitor prescription use compliance.       Education:Diabetes - Individual verbal and written instruction to review signs/symptoms of diabetes, desired ranges of glucose level fasting, after meals and with exercise. Acknowledge that pre and post exercise glucose checks will be done for 3 sessions at entry of program.   Pulmonary Rehab from 05/01/2019 in [facility] Cardiac and Pulmonary Rehab  Date  05/01/19  Educator  AS  Instruction Review Code  1- Verbalizes Understanding      Education: Know Your Numbers and Risk Factors: -Group verbal and written instruction about important numbers in your health.  Discussion of what are risk factors and how they play a role in the disease process.  Review of Cholesterol, Blood Pressure, Diabetes, and BMI and the role they play in your overall health.   Core Components/Risk Factors/Patient Goals Review:  Goals and Risk Factor Review    Row Name 05/24/19 1602 06/14/19 1537           Core Components/Risk Factors/Patient Goals Review   Personal Goals Review  Weight Management/Obesity;Develop more efficient breathing techniques such as purse lipped breathing and diaphragmatic breathing and practicing self-pacing with activity.;Hypertension;Improve shortness of breath with ADL's  Weight Management/Obesity;Develop more efficient breathing techniques such as purse lipped breathing and diaphragmatic breathing and practicing self-pacing with activity.;Hypertension;Improve shortness of breath with ADL's      Review  Pt reports talking medication as  directed, does not take BP at home, but has a cuff; encouraged to take daily at least when not at rehab. Pt reports ADL is hard, but manageable.  Eloyce is taking meds as directed.  She has started checking BP at home and it has been good.  She hasnt been able to tell a big difference in shortness of breath with ADLs.  She does feel stronger in her legs.      Expected Outcomes  ST: take BP regularly.  Short: continue to monitor BP at home Long: monitor risk factors         Core Components/Risk Factors/Patient Goals at Discharge (Final Review):  Goals and Risk Factor Review - 06/14/19 1537      Core Components/Risk Factors/Patient Goals Review   Personal Goals Review  Weight Management/Obesity;Develop more efficient breathing techniques such as purse lipped breathing and diaphragmatic breathing and practicing self-pacing with activity.;Hypertension;Improve shortness of breath with ADL's    Review  Kellyann is taking meds as directed.  She has started checking BP at home and it has been good.  She hasnt been able to tell a big difference in shortness of breath with ADLs.  She does feel stronger in her legs.    Expected Outcomes  Short: continue to monitor BP at home Long: monitor risk factors       ITP Comments: ITP Comments    Row Name 04/27/19 1114 05/03/19 0614 05/03/19 1642 05/25/19 1341 05/31/19 0541   ITP Comments  Virtual Orientation performed. Patient informed when to come in for RD and EP orientation. Diagnosis can be found in [facility] 04/20/2019  30 day chart review completed. ITP sent to Dr Zachery Dakins Medical Director, for review,changes as needed and signature. Continue with ITP if no changes requested  First full day of exercise!  Patient was oriented to gym and equipment including functions, settings, policies, and procedures.  Patient's individual exercise prescription and treatment plan were reviewed.  All starting workloads were established based on the results of the 6 minute walk  test done at initial orientation visit.  The plan for exercise progression was also introduced and progression will be customized based on patient's performance and goals.  Completed Initial RD Eval  30 Day review completed. Medical Director review done, changes made as directed,and approval shown by signature of Market researcher.   [place] Name 06/28/19 0554           ITP Comments  30 Day review completed. ITP review done, changes made as directed,and approval shown by signature of  Scientist, research (life sciences).          Comments:

## 2019-06-28 NOTE — Progress Notes (Signed)
Daily Session Note  Patient Details  Name: HALLEY SHEPHEARD MRN: [id number] Date of Birth: January 06, 1931 Referring Provider:     Pulmonary Rehab from 05/01/2019 in [facility] Cardiac and Pulmonary Rehab  Referring Provider  [name]      Encounter Date: 06/28/2019  Check In: Session Check In - 06/28/19 1525      Check-In   Supervising physician immediately available to respond to emergencies  See telemetry face sheet for immediately available ER MD    Location  [facility]-Cardiac & Pulmonary Rehab    Staff Present  Renita Papa, RN Vickki Hearing, BA, ACSM CEP, Exercise Physiologist;Melissa Caiola RDN, LDN    Virtual Visit  No    Medication changes reported      No    Fall or balance concerns reported     No    Warm-up and Cool-down  Performed on first and last piece of equipment    Resistance Training Performed  Yes    VAD Patient?  No    PAD/SET Patient?  No      Pain Assessment   Currently in Pain?  No/denies          Social History   Tobacco Use  Smoking Status Never Smoker  Smokeless Tobacco Never Used    Goals Met:  Independence with exercise equipment Exercise tolerated well No report of cardiac concerns or symptoms Strength training completed today  Goals Unmet:  Not Applicable  Comments: Pt able to follow exercise prescription today without complaint.  Will continue to monitor for progression.    Dr. Emily Filbert is Medical Director for [place] and [facility].

## 2019-07-03 ENCOUNTER — Other Ambulatory Visit: Payer: Self-pay

## 2019-07-03 ENCOUNTER — Encounter: Payer: Medicare HMO | Admitting: *Deleted

## 2019-07-03 DIAGNOSIS — J449 Chronic obstructive pulmonary disease, unspecified: Secondary | ICD-10-CM

## 2019-07-03 NOTE — Progress Notes (Signed)
Daily Session Note  Patient Details  Name: Lori Browning MRN: [id number] Date of Birth: February 22, 1930 Referring Provider:     Pulmonary Rehab from 05/01/2019 in [facility] Cardiac and Pulmonary Rehab  Referring Provider  [name]      Encounter Date: 07/03/2019  Check In: Session Check In - 07/03/19 1524      Check-In   Supervising physician immediately available to respond to emergencies  See telemetry face sheet for immediately available ER MD    Location  [facility]-Cardiac & Pulmonary Rehab    Staff Present  Renita Papa, RN BSN;Joseph [address] [place], [place], ACSM CEP, Exercise Physiologist    Virtual Visit  No    Medication changes reported      No    Fall or balance concerns reported     No    Warm-up and Cool-down  Performed on first and last piece of equipment    Resistance Training Performed  Yes    VAD Patient?  No    PAD/SET Patient?  No      Pain Assessment   Currently in Pain?  No/denies          Social History   Tobacco Use  Smoking Status Never Smoker  Smokeless Tobacco Never Used    Goals Met:  Independence with exercise equipment Exercise tolerated well No report of cardiac concerns or symptoms Strength training completed today  Goals Unmet:  Not Applicable  Comments: Pt able to follow exercise prescription today without complaint.  Will continue to monitor for progression.    Dr. Emily Filbert is Medical Director for [place] and [facility].

## 2019-07-05 ENCOUNTER — Encounter: Payer: Medicare HMO | Admitting: *Deleted

## 2019-07-05 ENCOUNTER — Other Ambulatory Visit: Payer: Self-pay

## 2019-07-05 DIAGNOSIS — J449 Chronic obstructive pulmonary disease, unspecified: Secondary | ICD-10-CM | POA: Diagnosis not present

## 2019-07-05 NOTE — Progress Notes (Signed)
Daily Session Note  Patient Details  Name: Lori Browning MRN: [id number] Date of Birth: 1930/03/05 Referring Provider:     Pulmonary Rehab from 05/01/2019 in [facility] Cardiac and Pulmonary Rehab  Referring Provider  [name]      Encounter Date: 07/05/2019  Check In: Session Check In - 07/05/19 1538      Check-In   Supervising physician immediately available to respond to emergencies  See telemetry face sheet for immediately available ER MD    Location  [facility]-Cardiac & Pulmonary Rehab    Staff Present  Renita Papa, RN BSN;Melissa Caiola RDN, Rowe Pavy, BA, ACSM CEP, Exercise Physiologist    Virtual Visit  No    Medication changes reported      No    Fall or balance concerns reported     No    Warm-up and Cool-down  Performed on first and last piece of equipment    Resistance Training Performed  Yes    VAD Patient?  No    PAD/SET Patient?  No      Pain Assessment   Currently in Pain?  No/denies          Social History   Tobacco Use  Smoking Status Never Smoker  Smokeless Tobacco Never Used    Goals Met:  Independence with exercise equipment Exercise tolerated well No report of cardiac concerns or symptoms Strength training completed today  Goals Unmet:  Not Applicable  Comments: Pt able to follow exercise prescription today without complaint.  Will continue to monitor for progression.    Dr. Emily Filbert is Medical Director for [place] and [facility].

## 2019-07-12 ENCOUNTER — Encounter: Payer: Medicare HMO | Attending: Pulmonary Disease | Admitting: *Deleted

## 2019-07-12 ENCOUNTER — Other Ambulatory Visit: Payer: Self-pay

## 2019-07-12 DIAGNOSIS — J449 Chronic obstructive pulmonary disease, unspecified: Secondary | ICD-10-CM | POA: Diagnosis present

## 2019-07-12 NOTE — Progress Notes (Signed)
Daily Session Note  Patient Details  Name: ADAHLIA STEMBRIDGE MRN: [id number] Date of Birth: [birth year] Referring Provider:     Pulmonary Rehab from 05/01/2019 in [facility] Cardiac and Pulmonary Rehab  Referring Provider  [name]      Encounter Date: 07/12/2019  Check In: Session Check In - 07/12/19 1528      Check-In   Supervising physician immediately available to respond to emergencies  See telemetry face sheet for immediately available ER MD    Location  [facility]-Cardiac & Pulmonary Rehab    Staff Present  Nada Maclachlan, BA, ACSM CEP, Exercise Physiologist;Melissa Caiola RDN, LDN;Margues Filippini Sherryll Burger, RN BSN    Virtual Visit  No    Medication changes reported      No    Fall or balance concerns reported     No    Warm-up and Cool-down  Performed on first and last piece of equipment    Resistance Training Performed  Yes    VAD Patient?  No    PAD/SET Patient?  No      Pain Assessment   Currently in Pain?  No/denies          Social History   Tobacco Use  Smoking Status Never Smoker  Smokeless Tobacco Never Used    Goals Met:  Independence with exercise equipment Exercise tolerated well No report of cardiac concerns or symptoms Strength training completed today  Goals Unmet:  Not Applicable  Comments: Pt able to follow exercise prescription today without complaint.  Will continue to monitor for progression.    Dr. Emily Filbert is Medical Director for [place] and [facility].

## 2019-07-17 ENCOUNTER — Other Ambulatory Visit: Payer: Self-pay

## 2019-07-17 DIAGNOSIS — J449 Chronic obstructive pulmonary disease, unspecified: Secondary | ICD-10-CM

## 2019-07-17 NOTE — Progress Notes (Signed)
Daily Session Note  Patient Details  Name: Lori Browning MRN: [id number] Date of Birth: July 10, 1930 Referring Provider:     Pulmonary Rehab from 05/01/2019 in [facility] Cardiac and Pulmonary Rehab  Referring Provider  [name]      Encounter Date: 07/17/2019  Check In: Session Check In - 07/17/19 1621      Check-In   Supervising physician immediately available to respond to emergencies  See telemetry face sheet for immediately available ER MD    Location  [facility]-Cardiac & Pulmonary Rehab    Staff Present  Basilia Jumbo, RN, BSN;Melissa Caiola RDN, LDN;Jessica Pellston, MA, RCEP, CCRP, [place], BS, ACSM CEP, Exercise Physiologist;Kara [place], [place] Exercise Physiologist;Joseph Tessie Fass RCP,RRT,BSRT    Virtual Visit  No    Medication changes reported      No    Fall or balance concerns reported     No    Tobacco Cessation  No Change    Warm-up and Cool-down  Performed on first and last piece of equipment    Resistance Training Performed  Yes    VAD Patient?  No    PAD/SET Patient?  No      Pain Assessment   Currently in Pain?  No/denies          Social History   Tobacco Use  Smoking Status Never Smoker  Smokeless Tobacco Never Used    Goals Met:  Independence with exercise equipment Exercise tolerated well No report of cardiac concerns or symptoms  Goals Unmet:  Not Applicable  Comments: Pt able to follow exercise prescription today without complaint.  Will continue to monitor for progression.   Dr. Emily Filbert is Medical Director for [place] and [facility].

## 2019-07-19 ENCOUNTER — Encounter: Payer: Medicare HMO | Admitting: *Deleted

## 2019-07-19 ENCOUNTER — Other Ambulatory Visit: Payer: Self-pay

## 2019-07-19 DIAGNOSIS — J449 Chronic obstructive pulmonary disease, unspecified: Secondary | ICD-10-CM | POA: Diagnosis not present

## 2019-07-19 NOTE — Progress Notes (Signed)
Daily Session Note  Patient Details  Name: Lori Browning MRN: [id number] Date of Birth: August 23, 1930 Referring Provider:     Pulmonary Rehab from 05/01/2019 in [facility] Cardiac and Pulmonary Rehab  Referring Provider  [name]      Encounter Date: 07/19/2019  Check In: Session Check In - 07/19/19 1625      Check-In   Supervising physician immediately available to respond to emergencies  See telemetry face sheet for immediately available ER MD    Location  [facility]-Cardiac & Pulmonary Rehab    Staff Present  Heath Lark, RN, BSN, CCRP;Laureen Owens Shark, BS, RRT, CPFT;Melissa Caiola RDN, LDN    Virtual Visit  No    Medication changes reported      No    Fall or balance concerns reported     No    Warm-up and Cool-down  Performed on first and last piece of equipment    Resistance Training Performed  Yes    VAD Patient?  No    PAD/SET Patient?  No      Pain Assessment   Currently in Pain?  No/denies          Social History   Tobacco Use  Smoking Status Never Smoker  Smokeless Tobacco Never Used    Goals Met:  Proper associated with RPD/PD & O2 Sat Independence with exercise equipment Exercise tolerated well No report of cardiac concerns or symptoms  Goals Unmet:  Not Applicable  Comments: Pt able to follow exercise prescription today without complaint.  Will continue to monitor for progression.    Dr. Emily Filbert is Medical Director for [place] and [facility].

## 2019-07-26 ENCOUNTER — Other Ambulatory Visit: Payer: Self-pay

## 2019-07-26 ENCOUNTER — Encounter: Payer: Medicare HMO | Admitting: *Deleted

## 2019-07-26 ENCOUNTER — Encounter: Payer: Self-pay | Admitting: *Deleted

## 2019-07-26 DIAGNOSIS — J449 Chronic obstructive pulmonary disease, unspecified: Secondary | ICD-10-CM | POA: Diagnosis not present

## 2019-07-26 NOTE — Progress Notes (Signed)
Daily Session Note  Patient Details  Name: DAVANEE KLINKNER MRN: [id number] Date of Birth: 1930-10-19 Referring Provider:     Pulmonary Rehab from 05/01/2019 in [facility] Cardiac and Pulmonary Rehab  Referring Provider [name]      Encounter Date: 07/26/2019  Check In:  Session Check In - 07/26/19 1544      Check-In   Supervising physician immediately available to respond to emergencies See telemetry face sheet for immediately available ER MD    Location [facility]-Cardiac & Pulmonary Rehab    Staff Present Renita Papa, RN BSN;Melissa Caiola RDN, Rowe Pavy, BA, ACSM CEP, Exercise Physiologist    Virtual Visit No    Medication changes reported     No    Fall or balance concerns reported    No    Warm-up and Cool-down Performed on first and last piece of equipment    Resistance Training Performed Yes    VAD Patient? No    PAD/SET Patient? No      Pain Assessment   Currently in Pain? No/denies              Social History   Tobacco Use  Smoking Status Never Smoker  Smokeless Tobacco Never Used    Goals Met:  Independence with exercise equipment Exercise tolerated well No report of cardiac concerns or symptoms Strength training completed today  Goals Unmet:  Not Applicable  Comments: Pt able to follow exercise prescription today without complaint.  Will continue to monitor for progression.    Dr. Emily Filbert is Medical Director for [place] and [facility].

## 2019-07-26 NOTE — Progress Notes (Signed)
Pulmonary Individual Treatment Plan  Patient Details  Name: Lori Browning MRN: [id number] Date of Birth: 1930-04-25 Referring Provider:     Pulmonary Rehab from 05/01/2019 in [facility] Cardiac and Pulmonary Rehab  Referring Provider Aleskerov      Initial Encounter Date:    Pulmonary Rehab from 05/01/2019 in [facility] Cardiac and Pulmonary Rehab  Date 05/01/19      Visit Diagnosis: Chronic obstructive pulmonary disease, unspecified COPD type ([place])  Patient's Home Medications on Admission:  Current Outpatient Medications:  .  albuterol (PROVENTIL) (2.5 MG/3ML) 0.083% nebulizer solution, Take 2.5 mg by nebulization every 6 (six) hours as needed for wheezing or shortness of breath., Disp: , Rfl:  .  carvedilol (COREG) 25 MG tablet, Take 25 mg by mouth 2 (two) times daily with a meal., Disp: , Rfl:  .  Cranberry 500 MG CAPS, Take 1 tablet by mouth daily. , Disp: , Rfl:  .  furosemide (LASIX) 40 MG tablet, Take 1 tablet (40 mg total) by mouth daily., Disp: 30 tablet, Rfl: 0 .  pravastatin (PRAVACHOL) 40 MG tablet, Take 40 mg by mouth daily., Disp: , Rfl:  .  spironolactone (ALDACTONE) 25 MG tablet, Take 1 tablet (25 mg total) by mouth daily., Disp: 30 tablet, Rfl: 0  Past Medical History: Past Medical History:  Diagnosis Date  . CHF (congestive heart failure) ([place])   . COPD (chronic obstructive pulmonary disease) ([place])   . Hyperlipemia   . Hypertension   . Urge incontinence     Tobacco Use: Social History   Tobacco Use  Smoking Status Never Smoker  Smokeless Tobacco Never Used    Labs: Recent Review Flowsheet Data   There is no flowsheet data to display.      Pulmonary Assessment Scores:  Pulmonary Assessment Scores    Row Name 05/01/19 1426         ADL UCSD   SOB Score total 30     Rest 1     Walk 3     Stairs 4     Bath 2     Dress 2     Shop 3       CAT Score   CAT Score 25       mMRC Score   mMRC Score 3            UCSD: Self-administered rating of  dyspnea associated with activities of daily living (ADLs) 6-point scale (0 = "not at all" to 5 = "maximal or unable to do because of breathlessness")  Scoring Scores range from 0 to 120.  Minimally important difference is 5 units  CAT: CAT can identify the health impairment of COPD patients and is better correlated with disease progression.  CAT has a scoring range of zero to 40. The CAT score is classified into four groups of low (less than 10), medium (10 - 20), high (21-30) and very high (31-40) based on the impact level of disease on health status. A CAT score over 10 suggests significant symptoms.  A worsening CAT score could be explained by an exacerbation, poor medication adherence, poor inhaler technique, or progression of COPD or comorbid conditions.  CAT MCID is 2 points  mMRC: mMRC (Modified Medical Research Council) Dyspnea Scale is used to assess the degree of baseline functional disability in patients of respiratory disease due to dyspnea. No minimal important difference is established. A decrease in score of 1 point or greater is considered a positive change.   Pulmonary Function Assessment:  Pulmonary Function Assessment - 04/27/19 1109      Breath   Shortness of Breath No;Limiting activity           Exercise Target Goals: Exercise Program Goal: Individual exercise prescription set using results from initial 6 min walk test and THRR while considering  patient's activity barriers and safety.   Exercise Prescription Goal: Initial exercise prescription builds to 30-45 minutes a day of aerobic activity, 2-3 days per week.  Home exercise guidelines will be given to patient during program as part of exercise prescription that the participant will acknowledge.  Education: Aerobic Exercise & Resistance Training: - Gives group verbal and written instruction on the various components of exercise. Focuses on aerobic and resistive training programs and the benefits of this training  and how to safely progress through these programs..   Education: Exercise & Equipment Safety: - Individual verbal instruction and demonstration of equipment use and safety with use of the equipment.   Pulmonary Rehab from 05/01/2019 in [facility] Cardiac and Pulmonary Rehab  Date 05/01/19  Educator AS  Instruction Review Code 1- Verbalizes Understanding      Education: Exercise Physiology & General Exercise Guidelines: - Group verbal and written instruction with models to review the exercise physiology of the cardiovascular system and associated critical values. Provides general exercise guidelines with specific guidelines to those with heart or lung disease.    Education: Flexibility, Balance, Mind/Body Relaxation: Provides group verbal/written instruction on the benefits of flexibility and balance training, including mind/body exercise modes such as yoga, pilates and tai chi.  Demonstration and skill practice provided.   Activity Barriers & Risk Stratification:   6 Minute Walk:  6 Minute Walk    Row Name 05/01/19 1409         6 Minute Walk   Phase Initial     Distance 600 feet     Walk Time 5 minutes     # of Rest Breaks 2     MPH 1.36     METS 1.77     RPE 15     Perceived Dyspnea  3     VO2 Peak 1.25     Symptoms Yes (comment)     Comments shortness of breath     Resting HR 65 bpm     Resting BP 124/74     Resting Oxygen Saturation  96 %     Exercise Oxygen Saturation  during 6 min walk 90 %     Max Ex. HR 96 bpm     Max Ex. BP 164/76     2 Minute Post BP 152/78       Interval HR   1 Minute HR 83     2 Minute HR 91     3 Minute HR 94     4 Minute HR 80  resting     5 Minute HR 93     6 Minute HR 96     2 Minute Post HR 70     Interval Heart Rate? Yes       Interval Oxygen   Interval Oxygen? Yes     1 Minute Oxygen Saturation % 92 %     1 Minute Liters of Oxygen 0 L     2 Minute Oxygen Saturation % 90 %     2 Minute Liters of Oxygen 0 L     3 Minute Oxygen  Saturation % 90 %     3 Minute Liters of Oxygen 0  L     4 Minute Oxygen Saturation % 90 %     4 Minute Liters of Oxygen 0 L     5 Minute Oxygen Saturation % 92 %     5 Minute Liters of Oxygen 0 L     6 Minute Oxygen Saturation % 90 %     6 Minute Liters of Oxygen 0 L     2 Minute Post Oxygen Saturation % 96 %     2 Minute Post Liters of Oxygen 0 L           Oxygen Initial Assessment:  Oxygen Initial Assessment - 04/27/19 1109      Home Oxygen   Home Oxygen Device None    Sleep Oxygen Prescription None    Home Exercise Oxygen Prescription None    Home at Rest Exercise Oxygen Prescription None      Initial 6 min Walk   Oxygen Used None      Program Oxygen Prescription   Program Oxygen Prescription None      Intervention   Short Term Goals To learn and understand importance of monitoring SPO2 with pulse oximeter and demonstrate accurate use of the pulse oximeter.;To learn and understand importance of maintaining oxygen saturations>88%;To learn and demonstrate proper pursed lip breathing techniques or other breathing techniques.;To learn and demonstrate proper use of respiratory medications    Long  Term Goals Verbalizes importance of monitoring SPO2 with pulse oximeter and return demonstration;Maintenance of O2 saturations>88%;Exhibits proper breathing techniques, such as pursed lip breathing or other method taught during program session;Compliance with respiratory medication;Demonstrates proper use of MDI's           Oxygen Re-Evaluation:  Oxygen Re-Evaluation    Row Name 05/03/19 1643 06/14/19 1547           Program Oxygen Prescription   Program Oxygen Prescription None None        Home Oxygen   Home Oxygen Device None None      Sleep Oxygen Prescription None None      Home Exercise Oxygen Prescription None None      Home at Rest Exercise Oxygen Prescription None None      Compliance with Home Oxygen Use Yes Yes        Goals/Expected Outcomes   Short Term Goals  To learn and understand importance of maintaining oxygen saturations>88%;To learn and understand importance of monitoring SPO2 with pulse oximeter and demonstrate accurate use of the pulse oximeter.;To learn and demonstrate proper pursed lip breathing techniques or other breathing techniques. To learn and understand importance of maintaining oxygen saturations>88%;To learn and understand importance of monitoring SPO2 with pulse oximeter and demonstrate accurate use of the pulse oximeter.;To learn and demonstrate proper pursed lip breathing techniques or other breathing techniques.      Long  Term Goals Verbalizes importance of monitoring SPO2 with pulse oximeter and return demonstration;Maintenance of O2 saturations>88%;Exhibits proper breathing techniques, such as pursed lip breathing or other method taught during program session Verbalizes importance of monitoring SPO2 with pulse oximeter and return demonstration;Maintenance of O2 saturations>88%;Exhibits proper breathing techniques, such as pursed lip breathing or other method taught during program session      Comments Reviewed PLB technique with pt.  Talked about how it works and it's importance in maintaining their exercise saturations.  MAintianing Exercise saturations above 88% wether on oxygen or not Reviewed PLB technique with pt.  Talked about how it works and it's importance in maintaining their exercise saturations.  MAintianing Exercise saturations above 88% wether on oxygen or not      Goals/Expected Outcomes Short: Become more profiecient at using PLB and being able to monitor pulse oximetry readings  Long: Become independent at using PLB. Short: Become more profiecient at using PLB and being able to monitor pulse oximetry readings  Long: Become independent at using PLB.             Oxygen Discharge (Final Oxygen Re-Evaluation):  Oxygen Re-Evaluation - 06/14/19 1547      Program Oxygen Prescription   Program Oxygen Prescription None        Home Oxygen   Home Oxygen Device None    Sleep Oxygen Prescription None    Home Exercise Oxygen Prescription None    Home at Rest Exercise Oxygen Prescription None    Compliance with Home Oxygen Use Yes      Goals/Expected Outcomes   Short Term Goals To learn and understand importance of maintaining oxygen saturations>88%;To learn and understand importance of monitoring SPO2 with pulse oximeter and demonstrate accurate use of the pulse oximeter.;To learn and demonstrate proper pursed lip breathing techniques or other breathing techniques.    Long  Term Goals Verbalizes importance of monitoring SPO2 with pulse oximeter and return demonstration;Maintenance of O2 saturations>88%;Exhibits proper breathing techniques, such as pursed lip breathing or other method taught during program session    Comments Reviewed PLB technique with pt.  Talked about how it works and it's importance in maintaining their exercise saturations.  MAintianing Exercise saturations above 88% wether on oxygen or not    Goals/Expected Outcomes Short: Become more profiecient at using PLB and being able to monitor pulse oximetry readings  Long: Become independent at using PLB.           Initial Exercise Prescription:  Initial Exercise Prescription - 05/01/19 1400      Date of Initial Exercise RX and Referring Provider   Date 05/01/19    Referring Provider Aleskerov      Treadmill   MPH 0.8    Grade 0    Minutes 15    METs 1      NuStep   Level 1    SPM 80    Minutes 15    METs 1      REL-XR   Level 1    Speed 50    Minutes 15    METs 1      T5 Nustep   Level 1    SPM 80    Minutes 15      Prescription Details   Frequency (times per week) 3    Duration Progress to 30 minutes of continuous aerobic without signs/symptoms of physical distress      Intensity   THRR 40-80% of Max Heartrate 93-120    Ratings of Perceived Exertion 11-13    Perceived Dyspnea 0-4      Resistance Training    Training Prescription Yes    Weight 3 lb    Reps 10-15           Perform Capillary Blood Glucose checks as needed.  Exercise Prescription Changes:  Exercise Prescription Changes    Row Name 05/01/19 1400 05/08/19 1400 05/24/19 1100 05/29/19 1700 06/06/19 1400     Response to Exercise   Blood Pressure (Admit) 124/74 138/64 112/64 -- 144/84   Blood Pressure (Exercise) 164/76 170/74 142/62 -- 178/80   Blood Pressure (Exit) 152/78 130/68 120/62 -- 122/68   Heart Rate (Admit) 65 bpm  88 bpm 69 bpm -- 61 bpm   Heart Rate (Exercise) 96 bpm 76 bpm 78 bpm -- 94 bpm   Heart Rate (Exit) 70 bpm 62 bpm 69 bpm -- 69 bpm   Oxygen Saturation (Admit) 96 % 88 % 94 % -- 92 %   Oxygen Saturation (Exercise) 90 % 92 % 91 % -- 91 %   Oxygen Saturation (Exit) 96 % 92 % 93 % -- 87 %   Rating of Perceived Exertion (Exercise) 15 11 15  -- 15   Perceived Dyspnea (Exercise) 3 -- 2 -- 2   Symptoms shortness of breath SOB -- -- SOB   Comments -- first full day of exercise -- -- --   Duration -- Progress to 30 minutes of  aerobic without signs/symptoms of physical distress Progress to 30 minutes of  aerobic without signs/symptoms of physical distress -- Continue with 30 min of aerobic exercise without signs/symptoms of physical distress.   Intensity -- THRR unchanged THRR unchanged -- THRR unchanged     Progression   Progression -- Continue to progress workloads to maintain intensity without signs/symptoms of physical distress. Continue to progress workloads to maintain intensity without signs/symptoms of physical distress. -- Continue to progress workloads to maintain intensity without signs/symptoms of physical distress.   Average METs -- 1.65 1.6 -- 2.1     Resistance Training   Training Prescription -- Yes Yes -- Yes   Weight -- 3 lb 3 lb -- 3 lb   Reps -- 10-15 10-15 -- 10-15     Interval Training   Interval Training -- No No -- No     Treadmill   MPH -- 0.8 0.8 -- 1.1   Grade -- 0 0 -- 0   Minutes  -- 15 15 -- 15   METs -- 1.6 1.6 -- 1.6     NuStep   Level -- -- -- -- 5   Minutes -- -- -- -- 15   METs -- -- -- -- 2.6     T5 Nustep   Level -- 1 1 -- --   SPM -- -- 80 -- --   Minutes -- 15 15 -- --   METs -- 1.7 -- -- --     Home Exercise Plan   Plans to continue exercise at -- -- -- Home (comment)  walk and stationary bike Home (comment)  walk and stationary bike   Frequency -- -- -- Add 2 additional days to program exercise sessions. Add 2 additional days to program exercise sessions.   Initial Home Exercises Provided -- -- -- 05/29/19 05/29/19   Row Name 06/15/19 1400 06/28/19 0800 07/13/19 1100         Response to Exercise   Blood Pressure (Admit) 138/66 130/82 138/60     Blood Pressure (Exercise) 146/70 148/70 154/78     Blood Pressure (Exit) 116/62 128/70 94/52     Heart Rate (Admit) 77 bpm 70 bpm 75 bpm     Heart Rate (Exercise) 77 bpm 80 bpm 90 bpm     Heart Rate (Exit) 65 bpm 71 bpm 78 bpm     Oxygen Saturation (Admit) 90 % 90 % 92 %     Oxygen Saturation (Exercise) 88 % 90 % 90 %     Oxygen Saturation (Exit) 92 % 93 % 93 %     Rating of Perceived Exertion (Exercise) 13 13 15      Perceived Dyspnea (Exercise) 2 2 3      Symptoms --  none SOB     Duration Continue with 30 min of aerobic exercise without signs/symptoms of physical distress. Continue with 30 min of aerobic exercise without signs/symptoms of physical distress. Continue with 30 min of aerobic exercise without signs/symptoms of physical distress.     Intensity THRR unchanged THRR unchanged THRR unchanged       Progression   Progression Continue to progress workloads to maintain intensity without signs/symptoms of physical distress. Continue to progress workloads to maintain intensity without signs/symptoms of physical distress. Continue to progress workloads to maintain intensity without signs/symptoms of physical distress.     Average METs 2 2.21 2.2       Resistance Training   Training Prescription  Yes Yes Yes     Weight 3 lb 3 lb 3 lb     Reps 10-15 10-15 --       Interval Training   Interval Training No No No       Treadmill   MPH -- 1.2 1.6     Grade -- 0 0     Minutes -- 15 15     METs -- 1.92 2.15       NuStep   Level 5 5 --     SPM 80 -- --     Minutes 15 15 --     METs 2.5 2.9 --       T5 Nustep   Level -- 2 5     SPM -- -- 80     Minutes -- 15 15     METs -- 1.8 --       Home Exercise Plan   Plans to continue exercise at Home (comment)  walk and stationary bike Home (comment)  walk and stationary bike Home (comment)  walk and stationary bike     Frequency Add 2 additional days to program exercise sessions. Add 2 additional days to program exercise sessions. Add 2 additional days to program exercise sessions.     Initial Home Exercises Provided 05/29/19 05/29/19 05/29/19            Exercise Comments:  Exercise Comments    Row Name 05/03/19 1642           Exercise Comments First full day of exercise!  Patient was oriented to gym and equipment including functions, settings, policies, and procedures.  Patient's individual exercise prescription and treatment plan were reviewed.  All starting workloads were established based on the results of the 6 minute walk test done at initial orientation visit.  The plan for exercise progression was also introduced and progression will be customized based on patient's performance and goals.              Exercise Goals and Review:  Exercise Goals    Row Name 05/01/19 1422             Exercise Goals   Increase Physical Activity Yes       Intervention Provide advice, education, support and counseling about physical activity/exercise needs.;Develop an individualized exercise prescription for aerobic and resistive training based on initial evaluation findings, risk stratification, comorbidities and participant's personal goals.       Expected Outcomes Short Term: Attend rehab on a regular basis to increase amount of  physical activity.;Long Term: Add in home exercise to make exercise part of routine and to increase amount of physical activity.;Long Term: Exercising regularly at least 3-5 days a week.       Increase Strength and Stamina Yes  Intervention Provide advice, education, support and counseling about physical activity/exercise needs.       Expected Outcomes Short Term: Increase workloads from initial exercise prescription for resistance, speed, and METs.;Short Term: Perform resistance training exercises routinely during rehab and add in resistance training at home;Long Term: Improve cardiorespiratory fitness, muscular endurance and strength as measured by increased METs and functional capacity (6MWT)       Able to understand and use rate of perceived exertion (RPE) scale Yes       Intervention Provide education and explanation on how to use RPE scale       Expected Outcomes Short Term: Able to use RPE daily in rehab to express subjective intensity level;Long Term:  Able to use RPE to guide intensity level when exercising independently       Able to understand and use Dyspnea scale Yes       Intervention Provide education and explanation on how to use Dyspnea scale       Expected Outcomes Short Term: Able to use Dyspnea scale daily in rehab to express subjective sense of shortness of breath during exertion;Long Term: Able to use Dyspnea scale to guide intensity level when exercising independently       Knowledge and understanding of Target Heart Rate Range (THRR) Yes       Intervention Provide education and explanation of THRR including how the numbers were predicted and where they are located for reference       Expected Outcomes Short Term: Able to use daily as guideline for intensity in rehab;Long Term: Able to use THRR to govern intensity when exercising independently;Short Term: Able to state/look up THRR       Able to check pulse independently Yes       Intervention Provide education and  demonstration on how to check pulse in carotid and radial arteries.;Review the importance of being able to check your own pulse for safety during independent exercise       Expected Outcomes Short Term: Able to explain why pulse checking is important during independent exercise;Long Term: Able to check pulse independently and accurately       Understanding of Exercise Prescription Yes       Intervention Provide education, explanation, and written materials on patient's individual exercise prescription       Expected Outcomes Short Term: Able to explain program exercise prescription;Long Term: Able to explain home exercise prescription to exercise independently              Exercise Goals Re-Evaluation :  Exercise Goals Re-Evaluation    Row Name 05/03/19 1642 05/24/19 1112 05/24/19 1545 05/29/19 1741 06/06/19 1417     Exercise Goal Re-Evaluation   Exercise Goals Review Able to understand and use rate of perceived exertion (RPE) scale;Able to understand and use Dyspnea scale;Knowledge and understanding of Target Heart Rate Range (THRR);Understanding of Exercise Prescription Increase Physical Activity;Increase Strength and Stamina;Able to understand and use rate of perceived exertion (RPE) scale;Able to understand and use Dyspnea scale;Knowledge and understanding of Target Heart Rate Range (THRR);Able to check pulse independently;Understanding of Exercise Prescription Increase Physical Activity;Increase Strength and Stamina;Able to understand and use rate of perceived exertion (RPE) scale;Able to understand and use Dyspnea scale;Knowledge and understanding of Target Heart Rate Range (THRR);Able to check pulse independently;Understanding of Exercise Prescription Increase Physical Activity;Increase Strength and Stamina;Able to understand and use rate of perceived exertion (RPE) scale;Able to understand and use Dyspnea scale;Knowledge and understanding of Target Heart Rate Range (THRR);Able to check  pulse  independently;Understanding of Exercise Prescription Increase Physical Activity;Increase Strength and Stamina;Understanding of Exercise Prescription   Comments Reviewed RPE scale, THR and program prescription with pt today.  Pt voiced understanding and was given a copy of goals to take home. Emmery has been attending consistently and tolerates exercise well.  Staff willl monitor progress. Shabria has ben having knee pain starting last Friday 05/19/19; she has been using ice and it has been feeling better and the swelling has gone down. Jess talked to pt regarding ice and heat as well as exercise. Reviewed home exercise with pt today.  Pt plans to walk and use stationary bike for exercise.  Reviewed THR, pulse, RPE, sign and symptoms, NTG use, and when to call 911 or MD.  Also discussed weather considerations and indoor options.  Pt voiced understanding. Fate is doing well in rehab.  She is now walking the full time on the treadmill at 1.1 mph.  We will continue to monitor her progress.   Expected Outcomes Short: Use RPE daily to regulate intensity. Long: Follow program prescription in THR. Short:  continue to attend consistently Long:  increase overall MET level Short:  continue to attend consistently, ice leg and rest leg until feeling better.  Long:  increase overall MET level Short: add in a day between Wed and Monday sessions Long: maintain exercise on her own Short: Continue to increase workloads  Long: Continue to improve stamina.   [place] Name 06/14/19 1541 06/28/19 0855 07/13/19 1153         Exercise Goal Re-Evaluation   Exercise Goals Review Increase Physical Activity;Increase Strength and Stamina;Understanding of Exercise Prescription Increase Physical Activity;Increase Strength and Stamina;Understanding of Exercise Prescription Increase Physical Activity;Increase Strength and Stamina;Understanding of Exercise Prescription     Comments Trynity has been walking outside on days not at [facility].  She walks  about 15 min.  We discussed adding one minute per week until she can do 20-30 min.  We discussed options for continuing to exercise after she finishes LW. Kymari has been doing well in rehab.  She is now on level 5 for the NuStep.  We will continue to monitor her progress. Gibson ws more short of breath today.  She said she had a big lunch with friends today and ate too much.     Expected Outcomes Short:  continue exercise at home Long:  maintain exercise Short: Increase T5 NuStep  Long: Continue to improve stamina. Short: continue to exercise consistently Long : increase overall stamina            Discharge Exercise Prescription (Final Exercise Prescription Changes):  Exercise Prescription Changes - 07/13/19 1100      Response to Exercise   Blood Pressure (Admit) 138/60    Blood Pressure (Exercise) 154/78    Blood Pressure (Exit) 94/52    Heart Rate (Admit) 75 bpm    Heart Rate (Exercise) 90 bpm    Heart Rate (Exit) 78 bpm    Oxygen Saturation (Admit) 92 %    Oxygen Saturation (Exercise) 90 %    Oxygen Saturation (Exit) 93 %    Rating of Perceived Exertion (Exercise) 15    Perceived Dyspnea (Exercise) 3    Symptoms SOB    Duration Continue with 30 min of aerobic exercise without signs/symptoms of physical distress.    Intensity THRR unchanged      Progression   Progression Continue to progress workloads to maintain intensity without signs/symptoms of physical distress.    Average METs  2.2      Resistance Training   Training Prescription Yes    Weight 3 lb      Interval Training   Interval Training No      Treadmill   MPH 1.6    Grade 0    Minutes 15    METs 2.15      T5 Nustep   Level 5    SPM 80    Minutes 15      Home Exercise Plan   Plans to continue exercise at Home (comment)   walk and stationary bike   Frequency Add 2 additional days to program exercise sessions.    Initial Home Exercises Provided 05/29/19           Nutrition:  Target Goals:  Understanding of nutrition guidelines, daily intake of sodium <1541m, cholesterol <2087m calories 30% from fat and 7% or less from saturated fats, daily to have 5 or more servings of fruits and vegetables.  Education: Controlling Sodium/Reading Food Labels -Group verbal and written material supporting the discussion of sodium use in heart healthy nutrition. Review and explanation with models, verbal and written materials for utilization of the food label.   Education: General Nutrition Guidelines/Fats and Fiber: -Group instruction provided by verbal, written material, models and posters to present the general guidelines for heart healthy nutrition. Gives an explanation and review of dietary fats and fiber.   Biometrics:  Pre Biometrics - 05/01/19 1423      Pre Biometrics   Height 5' 6"  (1.676 m)    Weight 236 lb 11.2 oz (107.4 kg)    BMI (Calculated) 38.22    Single Leg Stand 0 seconds            Nutrition Therapy Plan and Nutrition Goals:  Nutrition Therapy & Goals - 06/26/19 1619      Personal Nutrition Goals   Comments She lives at home alone alone and mostly eats out. Breakfast is typically scrambled eggs, sausage, english muffin and black coffee; Lunch: fruit; Dinner: hibachi steak and white rice OR a vegetable plate. Pt does not add salt to food. [place] park, [employer], [employer], [employer], [employer], [employer], delancies, etc. tries to watch what she eats when going out. Does not want to stop going out. Discussed HH eating           Nutrition Assessments:   MEDIFICTS Score Key:          ?70 Need to make dietary changes          40-70 Heart Healthy Diet         ? 40 Therapeutic Level Cholesterol Diet  Nutrition Goals Re-Evaluation:  Nutrition Goals Re-Evaluation    [place] 06/26/19 1600             Goals   Nutrition Goal ST: modify take out foods LT: Improve SOB, lose weight       Comment Review take-out menus        Expected Outcome ST: modify take out foods LT: Improve SOB, lose weight              Nutrition Goals Discharge (Final Nutrition Goals Re-Evaluation):  Nutrition Goals Re-Evaluation - 06/26/19 1600      Goals   Nutrition Goal ST: modify take out foods LT: Improve SOB, lose weight    Comment Review take-out menus    Expected Outcome ST: modify take out foods LT: Improve SOB, lose weight  Psychosocial: Target Goals: Acknowledge presence or absence of significant depression and/or stress, maximize coping skills, provide positive support system. Participant is able to verbalize types and ability to use techniques and skills needed for reducing stress and depression.   Education: Depression - Provides group verbal and written instruction on the correlation between heart/lung disease and depressed mood, treatment options, and the stigmas associated with seeking treatment.   Education: Sleep Hygiene -Provides group verbal and written instruction about how sleep can affect your health.  Define sleep hygiene, discuss sleep cycles and impact of sleep habits. Review good sleep hygiene tips.    Education: Stress and Anxiety: - Provides group verbal and written instruction about the health risks of elevated stress and causes of high stress.  Discuss the correlation between heart/lung disease and anxiety and treatment options. Review healthy ways to manage with stress and anxiety.   Initial Review & Psychosocial Screening:  Initial Psych Review & Screening - 04/27/19 1110      Initial Review   Current issues with None Identified      Family Dynamics   Good Support System? Yes    Comments She can look to her nephew and brother for support.      Barriers   Psychosocial barriers to participate in program The patient should benefit from training in stress management and relaxation.;There are no identifiable barriers or psychosocial needs.      Screening Interventions    Interventions Encouraged to exercise;Provide feedback about the scores to participant;To provide support and resources with identified psychosocial needs    Expected Outcomes Short Term goal: Utilizing psychosocial counselor, staff and physician to assist with identification of specific Stressors or current issues interfering with healing process. Setting desired goal for each stressor or current issue identified.;Long Term Goal: Stressors or current issues are controlled or eliminated.;Short Term goal: Identification and review with participant of any Quality of Life or Depression concerns found by scoring the questionnaire.;Long Term goal: The participant improves quality of Life and PHQ9 Scores as seen by post scores and/or verbalization of changes           Quality of Life Scores:  Scores of 19 and below usually indicate a poorer quality of life in these areas.  A difference of  2-3 points is a clinically meaningful difference.  A difference of 2-3 points in the total score of the Quality of Life Index has been associated with significant improvement in overall quality of life, self-image, physical symptoms, and general health in studies assessing change in quality of life.  PHQ-9: Recent Review Flowsheet Data    Depression screen [facility] 2/9 05/01/2019   Decreased Interest 0   Down, Depressed, Hopeless 0   PHQ - 2 Score 0   Altered sleeping 0   Tired, decreased energy 1   Change in appetite 0   Feeling bad or failure about yourself  0   Trouble concentrating 0   Moving slowly or fidgety/restless 0   PHQ-9 Score 1   Difficult doing work/chores Not difficult at all     Interpretation of Total Score  Total Score Depression Severity:  1-4 = Minimal depression, 5-9 = Mild depression, 10-14 = Moderate depression, 15-19 = Moderately severe depression, 20-27 = Severe depression   Psychosocial Evaluation and Intervention:  Psychosocial Evaluation - 04/27/19 1113      Psychosocial Evaluation  & Interventions   Interventions Encouraged to exercise with the program and follow exercise prescription    Comments Patient states that she  is not depressed and has a positive outlook on life.    Expected Outcomes Short: Attend [facility] stress management education to decrease stress. Long: Maintain exercise Post [facility] to keep stress at a minimum.    Continue Psychosocial Services  Follow up required by staff           Psychosocial Re-Evaluation:  Psychosocial Re-Evaluation    [place] Name 05/24/19 1601 06/14/19 1539           Psychosocial Re-Evaluation   Current issues with None Identified --      Comments Pt reports no problems. Philamena reports no stress cooncerns and she sleeps well most of the time.      Expected Outcomes Continue to manage stress levels. Short: conintue to exercise Long: maintain positive outlook      Interventions Encouraged to attend Pulmonary Rehabilitation for the exercise --      Continue Psychosocial Services  Follow up required by staff --             Psychosocial Discharge (Final Psychosocial Re-Evaluation):  Psychosocial Re-Evaluation - 06/14/19 1539      Psychosocial Re-Evaluation   Comments Marivel reports no stress cooncerns and she sleeps well most of the time.    Expected Outcomes Short: conintue to exercise Long: maintain positive outlook           Education: Education Goals: Education classes will be provided on a weekly basis, covering required topics. Participant will state understanding/return demonstration of topics presented.  Learning Barriers/Preferences:  Learning Barriers/Preferences - 04/27/19 1111      Learning Barriers/Preferences   Learning Barriers None    Learning Preferences None           General Pulmonary Education Topics:  Infection Prevention: - Provides verbal and written material to individual with discussion of infection control including proper hand washing and proper equipment cleaning during  exercise session.   Pulmonary Rehab from 05/01/2019 in [facility] Cardiac and Pulmonary Rehab  Date 05/01/19  Educator AS  Instruction Review Code 1- Verbalizes Understanding      Falls Prevention: - Provides verbal and written material to individual with discussion of falls prevention and safety.   Pulmonary Rehab from 05/01/2019 in [facility] Cardiac and Pulmonary Rehab  Date 05/01/19  Educator AS  Instruction Review Code 1- Verbalizes Understanding      Chronic Lung Diseases: - Group verbal and written instruction to review updates, respiratory medications, advancements in procedures and treatments. Discuss use of supplemental oxygen including available portable oxygen systems, continuous and intermittent flow rates, concentrators, personal use and safety guidelines. Review proper use of inhaler and spacers. Provide informative websites for self-education.    Energy Conservation: - Provide group verbal and written instruction for methods to conserve energy, plan and organize activities. Instruct on pacing techniques, use of adaptive equipment and posture/positioning to relieve shortness of breath.   Triggers and Exacerbations: - Group verbal and written instruction to review types of environmental triggers and ways to prevent exacerbations. Discuss weather changes, air quality and the benefits of nasal washing. Review warning signs and symptoms to help prevent infections. Discuss techniques for effective airway clearance, coughing, and vibrations.   AED/CPR: - Group verbal and written instruction with the use of models to demonstrate the basic use of the AED with the basic ABC's of resuscitation.   Anatomy and Physiology of the Lungs: - Group verbal and written instruction with the use of models to provide basic lung anatomy and physiology related to function, structure and  complications of lung disease.   Anatomy & Physiology of the Heart: - Group verbal and written instruction and models  provide basic cardiac anatomy and physiology, with the coronary electrical and arterial systems. Review of Valvular disease and Heart Failure   Cardiac Medications: - Group verbal and written instruction to review commonly prescribed medications for heart disease. Reviews the medication, class of the drug, and side effects.   Other: -Provides group and verbal instruction on various topics (see comments)   Knowledge Questionnaire Score:  Knowledge Questionnaire Score - 05/01/19 1425      Knowledge Questionnaire Score   Pre Score 13/18            Core Components/Risk Factors/Patient Goals at Admission:  Personal Goals and Risk Factors at Admission - 05/01/19 1423      Core Components/Risk Factors/Patient Goals on Admission    Weight Management Yes;Weight Loss;Weight Maintenance    Intervention Weight Management: Develop a combined nutrition and exercise program designed to reach desired caloric intake, while maintaining appropriate intake of nutrient and fiber, sodium and fats, and appropriate energy expenditure required for the weight goal.;Weight Management/Obesity: Establish reasonable short term and long term weight goals.;Weight Management: Provide education and appropriate resources to help participant work on and attain dietary goals.    Admit Weight 236 lb 11.2 oz (107.4 kg)    Goal Weight: Short Term 225 lb (102.1 kg)    Goal Weight: Long Term 215 lb (97.5 kg)    Expected Outcomes Long Term: Adherence to nutrition and physical activity/exercise program aimed toward attainment of established weight goal;Short Term: Continue to assess and modify interventions until short term weight is achieved;Weight Maintenance: Understanding of the daily nutrition guidelines, which includes 25-35% calories from fat, 7% or less cal from saturated fats, less than 22m cholesterol, less than 1.5gm of sodium, & 5 or more servings of fruits and vegetables daily;Weight Loss: Understanding of general  recommendations for a balanced deficit meal plan, which promotes 1-2 lb weight loss per week and includes a negative energy balance of [phone] kcal/d;Understanding recommendations for meals to include 15-35% energy as protein, 25-35% energy from fat, 35-60% energy from carbohydrates, less than 2059mof dietary cholesterol, 20-35 gm of total fiber daily;Understanding of distribution of calorie intake throughout the day with the consumption of 4-5 meals/snacks    Intervention Provide education, individualized exercise plan and daily activity instruction to help decrease symptoms of SOB with activities of daily living.    Expected Outcomes Short Term: Improve cardiorespiratory fitness to achieve a reduction of symptoms when performing ADLs;Long Term: Be able to perform more ADLs without symptoms or delay the onset of symptoms    Diabetes Yes    Intervention Provide education about signs/symptoms and action to take for hypo/hyperglycemia.;Provide education about proper nutrition, including hydration, and aerobic/resistive exercise prescription along with prescribed medications to achieve blood glucose in normal ranges: Fasting glucose 65-99 mg/dL    Expected Outcomes Short Term: Participant verbalizes understanding of the signs/symptoms and immediate care of hyper/hypoglycemia, proper foot care and importance of medication, aerobic/resistive exercise and nutrition plan for blood glucose control.;Long Term: Attainment of HbA1C < 7%.    Hypertension Yes    Intervention Provide education on lifestyle modifcations including regular physical activity/exercise, weight management, moderate sodium restriction and increased consumption of fresh fruit, vegetables, and low fat dairy, alcohol moderation, and smoking cessation.;Monitor prescription use compliance.           Education:Diabetes - Individual verbal and written instruction to review signs/symptoms  of diabetes, desired ranges of glucose level fasting,  after meals and with exercise. Acknowledge that pre and post exercise glucose checks will be done for 3 sessions at entry of program.   Pulmonary Rehab from 05/01/2019 in [facility] Cardiac and Pulmonary Rehab  Date 05/01/19  Educator AS  Instruction Review Code 1- Verbalizes Understanding      Education: Know Your Numbers and Risk Factors: -Group verbal and written instruction about important numbers in your health.  Discussion of what are risk factors and how they play a role in the disease process.  Review of Cholesterol, Blood Pressure, Diabetes, and BMI and the role they play in your overall health.   Core Components/Risk Factors/Patient Goals Review:   Goals and Risk Factor Review    Row Name 05/24/19 1602 06/14/19 1537           Core Components/Risk Factors/Patient Goals Review   Personal Goals Review Weight Management/Obesity;Develop more efficient breathing techniques such as purse lipped breathing and diaphragmatic breathing and practicing self-pacing with activity.;Hypertension;Improve shortness of breath with ADL's Weight Management/Obesity;Develop more efficient breathing techniques such as purse lipped breathing and diaphragmatic breathing and practicing self-pacing with activity.;Hypertension;Improve shortness of breath with ADL's      Review Pt reports talking medication as directed, does not take BP at home, but has a cuff; encouraged to take daily at least when not at rehab. Pt reports ADL is hard, but manageable. Shanedra is taking meds as directed.  She has started checking BP at home and it has been good.  She hasnt been able to tell a big difference in shortness of breath with ADLs.  She does feel stronger in her legs.      Expected Outcomes ST: take BP regularly. Short: continue to monitor BP at home Long: monitor risk factors             Core Components/Risk Factors/Patient Goals at Discharge (Final Review):   Goals and Risk Factor Review - 06/14/19 1537      Core  Components/Risk Factors/Patient Goals Review   Personal Goals Review Weight Management/Obesity;Develop more efficient breathing techniques such as purse lipped breathing and diaphragmatic breathing and practicing self-pacing with activity.;Hypertension;Improve shortness of breath with ADL's    Review Wateen is taking meds as directed.  She has started checking BP at home and it has been good.  She hasnt been able to tell a big difference in shortness of breath with ADLs.  She does feel stronger in her legs.    Expected Outcomes Short: continue to monitor BP at home Long: monitor risk factors           ITP Comments:  ITP Comments    Row Name 04/27/19 1114 05/03/19 0614 05/03/19 1642 05/25/19 1341 05/31/19 0541   ITP Comments Virtual Orientation performed. Patient informed when to come in for RD and EP orientation. Diagnosis can be found in [facility] 04/20/2019 30 day chart review completed. ITP sent to Dr Zachery Dakins Medical Director, for review,changes as needed and signature. Continue with ITP if no changes requested First full day of exercise!  Patient was oriented to gym and equipment including functions, settings, policies, and procedures.  Patient's individual exercise prescription and treatment plan were reviewed.  All starting workloads were established based on the results of the 6 minute walk test done at initial orientation visit.  The plan for exercise progression was also introduced and progression will be customized based on patient's performance and goals. Completed Initial RD Eval 30 Day  review completed. Medical Director review done, changes made as directed,and approval shown by signature of Market researcher.   [place] Name 06/28/19 0554 07/26/19 0551         ITP Comments 30 Day review completed. ITP review done, changes made as directed,and approval shown by signature of  Scientist, research (life sciences). 30 Day review completed. Medical Director ITP review done, changes made as directed, and  signed approval by Medical Director.             Comments: 30 Day review completed. Medical Director ITP review done, changes made as directed, and signed approval by Medical Director.

## 2019-07-31 ENCOUNTER — Encounter: Payer: Medicare HMO | Admitting: *Deleted

## 2019-07-31 ENCOUNTER — Other Ambulatory Visit: Payer: Self-pay

## 2019-07-31 DIAGNOSIS — J449 Chronic obstructive pulmonary disease, unspecified: Secondary | ICD-10-CM

## 2019-07-31 NOTE — Progress Notes (Signed)
Daily Session Note  Patient Details  Name: Lori Browning MRN: [id number] Date of Birth: 07/22/1930 Referring Provider:     Pulmonary Rehab from 05/01/2019 in [facility] Cardiac and Pulmonary Rehab  Referring Provider Aleskerov      Encounter Date: 07/31/2019  Check In:  Session Check In - 07/31/19 1530      Check-In   Supervising physician immediately available to respond to emergencies See telemetry face sheet for immediately available ER MD    Location [facility]-Cardiac & Pulmonary Rehab    Staff Present Meredith Craven, RN BSN;Joseph Hood RCP,RRT,BSRT;Kelly Hayes, BS, ACSM CEP, Exercise Physiologist;Amanda Sommer, BA, ACSM CEP, Exercise Physiologist    Virtual Visit No    Medication changes reported     No    Fall or balance concerns reported    No    Warm-up and Cool-down Performed on first and last piece of equipment    Resistance Training Performed Yes    VAD Patient? No    PAD/SET Patient? No      Pain Assessment   Currently in Pain? No/denies              Social History   Tobacco Use  Smoking Status Never Smoker  Smokeless Tobacco Never Used    Goals Met:  Independence with exercise equipment Exercise tolerated well No report of cardiac concerns or symptoms Strength training completed today  Goals Unmet:  Not Applicable  Comments: Pt able to follow exercise prescription today without complaint.  Will continue to monitor for progression.    Dr. Mark Miller is Medical Director for [facility] and [facility]. 

## 2019-08-02 ENCOUNTER — Other Ambulatory Visit: Payer: Self-pay

## 2019-08-02 ENCOUNTER — Encounter: Payer: Medicare HMO | Admitting: *Deleted

## 2019-08-02 DIAGNOSIS — J449 Chronic obstructive pulmonary disease, unspecified: Secondary | ICD-10-CM

## 2019-08-02 NOTE — Progress Notes (Signed)
Daily Session Note  Patient Details  Name: Lori Browning MRN: [id number] Date of Birth: Sep 12, 1930 Referring Provider:     Pulmonary Rehab from 05/01/2019 in [facility] Cardiac and Pulmonary Rehab  Referring Provider [name]      Encounter Date: 08/02/2019  Check In:  Session Check In - 08/02/19 1543      Check-In   Supervising physician immediately available to respond to emergencies See telemetry face sheet for immediately available ER MD    Location [facility]-Cardiac & Pulmonary Rehab    Staff Present Renita Papa, RN BSN;Melissa Caiola RDN, Rowe Pavy, BA, ACSM CEP, Exercise Physiologist    Virtual Visit No    Medication changes reported     No    Fall or balance concerns reported    No    Warm-up and Cool-down Performed on first and last piece of equipment    Resistance Training Performed Yes    VAD Patient? No    PAD/SET Patient? No      Pain Assessment   Currently in Pain? No/denies              Social History   Tobacco Use  Smoking Status Never Smoker  Smokeless Tobacco Never Used    Goals Met:  Independence with exercise equipment Exercise tolerated well No report of cardiac concerns or symptoms Strength training completed today  Goals Unmet:  Not Applicable  Comments: Pt able to follow exercise prescription today without complaint.  Will continue to monitor for progression.    Dr. Emily Filbert is Medical Director for [facility] and [facility].

## 2019-08-07 ENCOUNTER — Other Ambulatory Visit: Payer: Self-pay

## 2019-08-07 ENCOUNTER — Encounter: Payer: Medicare HMO | Admitting: *Deleted

## 2019-08-07 DIAGNOSIS — J449 Chronic obstructive pulmonary disease, unspecified: Secondary | ICD-10-CM

## 2019-08-07 NOTE — Progress Notes (Signed)
Daily Session Note  Patient Details  Name: Lori Browning MRN: [id number] Date of Birth: [birth year] Referring Provider:     Pulmonary Rehab from 05/01/2019 in [facility] Cardiac and Pulmonary Rehab  Referring Provider [name]      Encounter Date: 08/07/2019  Check In:  Session Check In - 08/07/19 1541      Check-In   Supervising physician immediately available to respond to emergencies See telemetry face sheet for immediately available ER MD    Staff Present Renita Papa, RN BSN;Joseph [address] [place], [place], [place], CCRP, [place], [place], ACSM CEP, Exercise Physiologist    Virtual Visit No    Medication changes reported     No    Fall or balance concerns reported    No    Warm-up and Cool-down Performed on first and last piece of equipment    Resistance Training Performed Yes    VAD Patient? No    PAD/SET Patient? No      Pain Assessment   Currently in Pain? No/denies              Social History   Tobacco Use  Smoking Status Never Smoker  Smokeless Tobacco Never Used    Goals Met:  Independence with exercise equipment Exercise tolerated well No report of cardiac concerns or symptoms Strength training completed today  Goals Unmet:  Not Applicable  Comments: Pt able to follow exercise prescription today without complaint.  Will continue to monitor for progression.    Dr. Emily Filbert is Medical Director for [facility] and [facility].

## 2019-08-09 ENCOUNTER — Encounter: Payer: Medicare HMO | Admitting: *Deleted

## 2019-08-09 ENCOUNTER — Other Ambulatory Visit: Payer: Self-pay

## 2019-08-09 DIAGNOSIS — J449 Chronic obstructive pulmonary disease, unspecified: Secondary | ICD-10-CM

## 2019-08-09 NOTE — Progress Notes (Signed)
Daily Session Note  Patient Details  Name: Lori Browning MRN: [id number] Date of Birth: 03-12-1930 Referring Provider:     Pulmonary Rehab from 05/01/2019 in [facility] Card[facility] and Pul[facility] Re[facility]  Referring Provider [name]      Encounter Date: 08/09/2019  Check In:  Session Check In - 08/09/19 1546      Check-In   Supervising physician immediately available to respond to emergencies See telemetry face sheet for immediately available ER MD    Location [facility]-Cardiac & Pulmonary Rehab    Staff Present Renita Papa, RN BSN;Laureen Owens Shark, BS, RRT, CPFT;Amanda Oletta Darter, BA, ACSM CEP, Exercise Physiologist    Virtual Visit No    Medication changes reported     No    Fall or balance concerns reported    No    Warm-up and Cool-down Performed on first and last piece of equipment    Resistance Training Performed Yes    VAD Patient? No    PAD/SET Patient? No      Pain Assessment   Currently in Pain? No/denies              Social History   Tobacco Use  Smoking Status Never Smoker  Smokeless Tobacco Never Used    Goals Met:  Independence with exercise equipment Exercise tolerated well No report of cardiac concerns or symptoms Strength training completed today  Goals Unmet:  Not Applicable  Comments: Pt able to follow exercise prescription today without complaint.  Will continue to monitor for progression.    Dr. Emily Filbert is Medical Director for [place] and [facility].

## 2019-08-16 ENCOUNTER — Other Ambulatory Visit: Payer: Self-pay

## 2019-08-16 ENCOUNTER — Encounter: Payer: Medicare HMO | Attending: Pulmonary Disease | Admitting: *Deleted

## 2019-08-16 DIAGNOSIS — J449 Chronic obstructive pulmonary disease, unspecified: Secondary | ICD-10-CM | POA: Insufficient documentation

## 2019-08-16 NOTE — Progress Notes (Signed)
Daily Session Note  Patient Details  Name: Lori Browning MRN: [id number] Date of Birth: 01/19/1931 Referring Provider:     Pulmonary Rehab from 05/01/2019 in [facility] Cardiac and Pul[facility] Rehab  Referring Provider Aleskerov      Encounter Date: 08/16/2019  Check In:  Session Check In - 08/16/19 1543      Check-In   Supervising physician immediately available to respond to emergencies See telemetry face sheet for immediately available ER MD    Location [facility]-Cardiac & Pulmonary Rehab    Staff Present Meredith Craven, RN BSN;Joseph Hood RCP,RRT,BSRT;Kara Langdon, MS Exercise Physiologist    Virtual Visit No    Medication changes reported     No    Fall or balance concerns reported    No    Warm-up and Cool-down Performed on first and last piece of equipment    Resistance Training Performed Yes    VAD Patient? No    PAD/SET Patient? No      Pain Assessment   Currently in Pain? No/denies              Social History   Tobacco Use  Smoking Status Never Smoker  Smokeless Tobacco Never Used    Goals Met:  Independence with exercise equipment Exercise tolerated well No report of cardiac concerns or symptoms Strength training completed today  Goals Unmet:  Not Applicable  Comments: Pt able to follow exercise prescription today without complaint.  Will continue to monitor for progression.    Dr. Mark Miller is Medical Director for [facility] and [facility]. 

## 2019-08-23 ENCOUNTER — Encounter: Payer: Self-pay | Admitting: *Deleted

## 2019-08-23 DIAGNOSIS — J449 Chronic obstructive pulmonary disease, unspecified: Secondary | ICD-10-CM

## 2019-08-23 NOTE — Progress Notes (Signed)
Pulmonary Individual Treatment Plan  Patient Details  Name: Lori Browning MRN: [id number] Date of Birth: 1931/01/29 Referring Provider:     Pulmonary Rehab from 05/01/2019 in [facility] Card[facility] and Pulmonary Rehab  Referring Provider Aleskerov      Initial Encounter Date:    Pulmonary Rehab from 05/01/2019 in [facility] Cardiac and Pulmonary Rehab  Date 05/01/19      Visit Diagnosis: Chronic obstructive pulmonary disease, unspecified COPD type ([place])  Patient's Home Medications on Admission:  Current Outpatient Medications:  .  albuterol (PROVENTIL) (2.5 MG/3ML) 0.083% nebulizer solution, Take 2.5 mg by nebulization every 6 (six) hours as needed for wheezing or shortness of breath., Disp: , Rfl:  .  carvedilol (COREG) 25 MG tablet, Take 25 mg by mouth 2 (two) times daily with a meal., Disp: , Rfl:  .  Cranberry 500 MG CAPS, Take 1 tablet by mouth daily. , Disp: , Rfl:  .  furosemide (LASIX) 40 MG tablet, Take 1 tablet (40 mg total) by mouth daily., Disp: 30 tablet, Rfl: 0 .  pravastatin (PRAVACHOL) 40 MG tablet, Take 40 mg by mouth daily., Disp: , Rfl:  .  spironolactone (ALDACTONE) 25 MG tablet, Take 1 tablet (25 mg total) by mouth daily., Disp: 30 tablet, Rfl: 0  Past Medical History: Past Medical History:  Diagnosis Date  . CHF (congestive heart failure) ([place])   . COPD (chronic obstructive pulmonary disease) ([place])   . Hyperlipemia   . Hypertension   . Urge incontinence     Tobacco Use: Social History   Tobacco Use  Smoking Status Never Smoker  Smokeless Tobacco Never Used    Labs: Recent Review Flowsheet Data   There is no flowsheet data to display.      Pulmonary Assessment Scores:  Pulmonary Assessment Scores    Row Name 05/01/19 1426         ADL UCSD   SOB Score total 30     Rest 1     Walk 3     Stairs 4     Bath 2     Dress 2     Shop 3       CAT Score   CAT Score 25       mMRC Score   mMRC Score 3            UCSD: Self-administered rating of  dyspnea associated with activities of daily living (ADLs) 6-point scale (0 = "not at all" to 5 = "maximal or unable to do because of breathlessness")  Scoring Scores range from 0 to 120.  Minimally important difference is 5 units  CAT: CAT can identify the health impairment of COPD patients and is better correlated with disease progression.  CAT has a scoring range of zero to 40. The CAT score is classified into four groups of low (less than 10), medium (10 - 20), high (21-30) and very high (31-40) based on the impact level of disease on health status. A CAT score over 10 suggests significant symptoms.  A worsening CAT score could be explained by an exacerbation, poor medication adherence, poor inhaler technique, or progression of COPD or comorbid conditions.  CAT MCID is 2 points  mMRC: mMRC (Modified Medical Research Council) Dyspnea Scale is used to assess the degree of baseline functional disability in patients of respiratory disease due to dyspnea. No minimal important difference is established. A decrease in score of 1 point or greater is considered a positive change.   Pulmonary Function Assessment:  Pulmonary Function Assessment - 04/27/19 1109      Breath   Shortness of Breath No;Limiting activity           Exercise Target Goals: Exercise Program Goal: Individual exercise prescription set using results from initial 6 min walk test and THRR while considering  patient's activity barriers and safety.   Exercise Prescription Goal: Initial exercise prescription builds to 30-45 minutes a day of aerobic activity, 2-3 days per week.  Home exercise guidelines will be given to patient during program as part of exercise prescription that the participant will acknowledge.  Education: Aerobic Exercise & Resistance Training: - Gives group verbal and written instruction on the various components of exercise. Focuses on aerobic and resistive training programs and the benefits of this training  and how to safely progress through these programs..   Education: Exercise & Equipment Safety: - Individual verbal instruction and demonstration of equipment use and safety with use of the equipment.   Pulmonary Rehab from 05/01/2019 in [facility] Pulmonary Rehab  Date 05/01/19  Educator AS  Instruction Review Code 1- Verbalizes Understanding      Education: Exercise Physiology & General Exercise Guidelines: - Group verbal and written instruction with models to review the exercise physiology of the cardiovascular system and associated critical values. Provides general exercise guidelines with specific guidelines to those with heart or lung disease.    Education: Flexibility, Balance, Mind/Body Relaxation: Provides group verbal/written instruction on the benefits of flexibility and balance training, including mind/body exercise modes such as yoga, pilates and tai chi.  Demonstration and skill practice provided.   Activity Barriers & Risk Stratification:   6 Minute Walk:  6 Minute Walk    Row Name 05/01/19 1409         6 Minute Walk   Phase Initial     Distance 600 feet     Walk Time 5 minutes     # of Rest Breaks 2     MPH 1.36     METS 1.77     RPE 15     Perceived Dyspnea  3     VO2 Peak 1.25     Symptoms Yes (comment)     Comments shortness of breath     Resting HR 65 bpm     Resting BP 124/74     Resting Oxygen Saturation  96 %     Exercise Oxygen Saturation  during 6 min walk 90 %     Max Ex. HR 96 bpm     Max Ex. BP 164/76     2 Minute Post BP 152/78       Interval HR   1 Minute HR 83     2 Minute HR 91     3 Minute HR 94     4 Minute HR 80  resting     5 Minute HR 93     6 Minute HR 96     2 Minute Post HR 70     Interval Heart Rate? Yes       Interval Oxygen   Interval Oxygen? Yes     1 Minute Oxygen Saturation % 92 %     1 Minute Liters of Oxygen 0 L     2 Minute Oxygen Saturation % 90 %     2 Minute Liters of Oxygen 0 L     3 Minute Oxygen  Saturation % 90 %     3 Minute Liters of Oxygen 0  L     4 Minute Oxygen Saturation % 90 %     4 Minute Liters of Oxygen 0 L     5 Minute Oxygen Saturation % 92 %     5 Minute Liters of Oxygen 0 L     6 Minute Oxygen Saturation % 90 %     6 Minute Liters of Oxygen 0 L     2 Minute Post Oxygen Saturation % 96 %     2 Minute Post Liters of Oxygen 0 L           Oxygen Initial Assessment:  Oxygen Initial Assessment - 04/27/19 1109      Home Oxygen   Home Oxygen Device None    Sleep Oxygen Prescription None    Home Exercise Oxygen Prescription None    Home at Rest Exercise Oxygen Prescription None      Initial 6 min Walk   Oxygen Used None      Program Oxygen Prescription   Program Oxygen Prescription None      Intervention   Short Term Goals To learn and understand importance of monitoring SPO2 with pulse oximeter and demonstrate accurate use of the pulse oximeter.;To learn and understand importance of maintaining oxygen saturations>88%;To learn and demonstrate proper pursed lip breathing techniques or other breathing techniques.;To learn and demonstrate proper use of respiratory medications    Long  Term Goals Verbalizes importance of monitoring SPO2 with pulse oximeter and return demonstration;Maintenance of O2 saturations>88%;Exhibits proper breathing techniques, such as pursed lip breathing or other method taught during program session;Compliance with respiratory medication;Demonstrates proper use of MDI's           Oxygen Re-Evaluation:  Oxygen Re-Evaluation    Row Name 05/03/19 1643 06/14/19 1547 08/07/19 1615         Program Oxygen Prescription   Program Oxygen Prescription None None None       Home Oxygen   Home Oxygen Device None None None     Sleep Oxygen Prescription None None None     Home Exercise Oxygen Prescription None None None     Home at Rest Exercise Oxygen Prescription None None None     Compliance with Home Oxygen Use Yes Yes Yes        Goals/Expected Outcomes   Short Term Goals To learn and understand importance of maintaining oxygen saturations>88%;To learn and understand importance of monitoring SPO2 with pulse oximeter and demonstrate accurate use of the pulse oximeter.;To learn and demonstrate proper pursed lip breathing techniques or other breathing techniques. To learn and understand importance of maintaining oxygen saturations>88%;To learn and understand importance of monitoring SPO2 with pulse oximeter and demonstrate accurate use of the pulse oximeter.;To learn and demonstrate proper pursed lip breathing techniques or other breathing techniques. To learn and understand importance of maintaining oxygen saturations>88%;To learn and understand importance of monitoring SPO2 with pulse oximeter and demonstrate accurate use of the pulse oximeter.;To learn and demonstrate proper pursed lip breathing techniques or other breathing techniques.;To learn and demonstrate proper use of respiratory medications     Long  Term Goals Verbalizes importance of monitoring SPO2 with pulse oximeter and return demonstration;Maintenance of O2 saturations>88%;Exhibits proper breathing techniques, such as pursed lip breathing or other method taught during program session Verbalizes importance of monitoring SPO2 with pulse oximeter and return demonstration;Maintenance of O2 saturations>88%;Exhibits proper breathing techniques, such as pursed lip breathing or other method taught during program session Verbalizes importance of monitoring SPO2 with pulse oximeter and return  demonstration;Maintenance of O2 saturations>88%;Exhibits proper breathing techniques, such as pursed lip breathing or other method taught during program session;Compliance with respiratory medication     Comments Reviewed PLB technique with pt.  Talked about how it works and it's importance in maintaining their exercise saturations.  MAintianing Exercise saturations above 88% wether on oxygen  or not Reviewed PLB technique with pt.  Talked about how it works and it's importance in maintaining their exercise saturations.  MAintianing Exercise saturations above 88% wether on oxygen or not Lori Browning is doing well with her PLB.  She watches her sats at home with a pulse oximeter.  She said it usually stays in the upper 80s.  She does use her nebulizer daily.     Goals/Expected Outcomes Short: Become more profiecient at using PLB and being able to monitor pulse oximetry readings  Long: Become independent at using PLB. Short: Become more profiecient at using PLB and being able to monitor pulse oximetry readings  Long: Become independent at using PLB. Short: Conitnue to use PLB for breathing  Long; Continued compliance with nebulizer.            Oxygen Discharge (Final Oxygen Re-Evaluation):  Oxygen Re-Evaluation - 08/07/19 1615      Program Oxygen Prescription   Program Oxygen Prescription None      Home Oxygen   Home Oxygen Device None    Sleep Oxygen Prescription None    Home Exercise Oxygen Prescription None    Home at Rest Exercise Oxygen Prescription None    Compliance with Home Oxygen Use Yes      Goals/Expected Outcomes   Short Term Goals To learn and understand importance of maintaining oxygen saturations>88%;To learn and understand importance of monitoring SPO2 with pulse oximeter and demonstrate accurate use of the pulse oximeter.;To learn and demonstrate proper pursed lip breathing techniques or other breathing techniques.;To learn and demonstrate proper use of respiratory medications    Long  Term Goals Verbalizes importance of monitoring SPO2 with pulse oximeter and return demonstration;Maintenance of O2 saturations>88%;Exhibits proper breathing techniques, such as pursed lip breathing or other method taught during program session;Compliance with respiratory medication    Comments Lori Browning is doing well with her PLB.  She watches her sats at home with a pulse oximeter.  She said  it usually stays in the upper 80s.  She does use her nebulizer daily.    Goals/Expected Outcomes Short: Conitnue to use PLB for breathing  Long; Continued compliance with nebulizer.           Initial Exercise Prescription:  Initial Exercise Prescription - 05/01/19 1400      Date of Initial Exercise RX and Referring Provider   Date 05/01/19    Referring Provider Aleskerov      Treadmill   MPH 0.8    Grade 0    Minutes 15    METs 1      NuStep   Level 1    SPM 80    Minutes 15    METs 1      REL-XR   Level 1    Speed 50    Minutes 15    METs 1      T5 Nustep   Level 1    SPM 80    Minutes 15      Prescription Details   Frequency (times per week) 3    Duration Progress to 30 minutes of continuous aerobic without signs/symptoms of physical distress      Intensity  THRR 40-80% of Max Heartrate 93-120    Ratings of Perceived Exertion 11-13    Perceived Dyspnea 0-4      Resistance Training   Training Prescription Yes    Weight 3 lb    Reps 10-15           Perform Capillary Blood Glucose checks as needed.  Exercise Prescription Changes:  Exercise Prescription Changes    Row Name 05/01/19 1400 05/08/19 1400 05/24/19 1100 05/29/19 1700 06/06/19 1400     Response to Exercise   Blood Pressure (Admit) 124/74 138/64 112/64 -- 144/84   Blood Pressure (Exercise) 164/76 170/74 142/62 -- 178/80   Blood Pressure (Exit) 152/78 130/68 120/62 -- 122/68   Heart Rate (Admit) 65 bpm 88 bpm 69 bpm -- 61 bpm   Heart Rate (Exercise) 96 bpm 76 bpm 78 bpm -- 94 bpm   Heart Rate (Exit) 70 bpm 62 bpm 69 bpm -- 69 bpm   Oxygen Saturation (Admit) 96 % 88 % 94 % -- 92 %   Oxygen Saturation (Exercise) 90 % 92 % 91 % -- 91 %   Oxygen Saturation (Exit) 96 % 92 % 93 % -- 87 %   Rating of Perceived Exertion (Exercise) 15 11 15  -- 15   Perceived Dyspnea (Exercise) 3 -- 2 -- 2   Symptoms shortness of breath SOB -- -- SOB   Comments -- first full day of exercise -- -- --   Duration  -- Progress to 30 minutes of  aerobic without signs/symptoms of physical distress Progress to 30 minutes of  aerobic without signs/symptoms of physical distress -- Continue with 30 min of aerobic exercise without signs/symptoms of physical distress.   Intensity -- THRR unchanged THRR unchanged -- THRR unchanged     Progression   Progression -- Continue to progress workloads to maintain intensity without signs/symptoms of physical distress. Continue to progress workloads to maintain intensity without signs/symptoms of physical distress. -- Continue to progress workloads to maintain intensity without signs/symptoms of physical distress.   Average METs -- 1.65 1.6 -- 2.1     Resistance Training   Training Prescription -- Yes Yes -- Yes   Weight -- 3 lb 3 lb -- 3 lb   Reps -- 10-15 10-15 -- 10-15     Interval Training   Interval Training -- No No -- No     Treadmill   MPH -- 0.8 0.8 -- 1.1   Grade -- 0 0 -- 0   Minutes -- 15 15 -- 15   METs -- 1.6 1.6 -- 1.6     NuStep   Level -- -- -- -- 5   Minutes -- -- -- -- 15   METs -- -- -- -- 2.6     T5 Nustep   Level -- 1 1 -- --   SPM -- -- 80 -- --   Minutes -- 15 15 -- --   METs -- 1.7 -- -- --     Home Exercise Plan   Plans to continue exercise at -- -- -- Home (comment)  walk and stationary bike Home (comment)  walk and stationary bike   Frequency -- -- -- Add 2 additional days to program exercise sessions. Add 2 additional days to program exercise sessions.   Initial Home Exercises Provided -- -- -- 05/29/19 05/29/19   Row Name 06/15/19 1400 06/28/19 0800 07/13/19 1100 07/27/19 1600 08/09/19 1500     Response to Exercise   Blood Pressure (Admit) 138/66  130/82 138/60 130/78 122/64   Blood Pressure (Exercise) 146/70 148/70 154/78 166/70 144/66   Blood Pressure (Exit) 116/62 128/70 94/52 130/64 122/64   Heart Rate (Admit) 77 bpm 70 bpm 75 bpm 68 bpm 64 bpm   Heart Rate (Exercise) 77 bpm 80 bpm 90 bpm 85 bpm 89 bpm   Heart Rate  (Exit) 65 bpm 71 bpm 78 bpm 70 bpm 66 bpm   Oxygen Saturation (Admit) 90 % 90 % 92 % 90 % 91 %   Oxygen Saturation (Exercise) 88 % 90 % 90 % 89 % 92 %   Oxygen Saturation (Exit) 92 % 93 % 93 % 92 % 92 %   Rating of Perceived Exertion (Exercise) 13 13 15 15 13    Perceived Dyspnea (Exercise) 2 2 3 3 2    Symptoms -- none SOB SOB none   Duration Continue with 30 min of aerobic exercise without signs/symptoms of physical distress. Continue with 30 min of aerobic exercise without signs/symptoms of physical distress. Continue with 30 min of aerobic exercise without signs/symptoms of physical distress. Continue with 30 min of aerobic exercise without signs/symptoms of physical distress. Continue with 30 min of aerobic exercise without signs/symptoms of physical distress.   Intensity THRR unchanged THRR unchanged THRR unchanged THRR unchanged THRR unchanged     Progression   Progression Continue to progress workloads to maintain intensity without signs/symptoms of physical distress. Continue to progress workloads to maintain intensity without signs/symptoms of physical distress. Continue to progress workloads to maintain intensity without signs/symptoms of physical distress. Continue to progress workloads to maintain intensity without signs/symptoms of physical distress. Continue to progress workloads to maintain intensity without signs/symptoms of physical distress.   Average METs 2 2.21 2.2 2.08 2.5     Resistance Training   Training Prescription Yes Yes Yes Yes Yes   Weight 3 lb 3 lb 3 lb 3 lb 3 lb   Reps 10-15 10-15 -- 10-15 10-15     Interval Training   Interval Training No No No No No     Treadmill   MPH -- 1.2 1.6 1.5 --   Grade -- 0 0 0 --   Minutes -- 15 15 15  --   METs -- 1.92 2.15 2.15 --     NuStep   Level 5 5 -- 3 5   SPM 80 -- -- -- --   Minutes 15 15 -- 15 15   METs 2.5 2.9 -- -- 2.5     T5 Nustep   Level -- 2 5 3  --   SPM -- -- 80 -- --   Minutes -- 15 15 15  --   METs --  1.8 -- 2 --     Home Exercise Plan   Plans to continue exercise at Home (comment)  walk and stationary bike Home (comment)  walk and stationary bike Home (comment)  walk and stationary bike Home (comment)  walk and stationary bike Home (comment)  walk and stationary bike   Frequency Add 2 additional days to program exercise sessions. Add 2 additional days to program exercise sessions. Add 2 additional days to program exercise sessions. Add 2 additional days to program exercise sessions. Add 2 additional days to program exercise sessions.   Initial Home Exercises Provided 05/29/19 05/29/19 05/29/19 05/29/19 05/29/19   Row Name 08/21/19 1500             Response to Exercise   Blood Pressure (Admit) 142/64       Blood Pressure (  Exercise) 150/72       Blood Pressure (Exit) 134/70       Heart Rate (Admit) 76 bpm       Heart Rate (Exercise) 86 bpm       Heart Rate (Exit) 72 bpm       Oxygen Saturation (Admit) 94 %       Oxygen Saturation (Exercise) 89 %       Oxygen Saturation (Exit) 93 %       Rating of Perceived Exertion (Exercise) 13       Perceived Dyspnea (Exercise) 2       Symptoms none       Duration Continue with 30 min of aerobic exercise without signs/symptoms of physical distress.       Intensity THRR unchanged         Progression   Progression Continue to progress workloads to maintain intensity without signs/symptoms of physical distress.       Average METs 2.1         Resistance Training   Training Prescription Yes       Weight 3 lb       Reps 10-15         Interval Training   Interval Training No         Treadmill   MPH 1.5       Grade 0       Minutes 15       METs 2.15         T5 Nustep   Level 5       Minutes 15       METs 2.1         Home Exercise Plan   Plans to continue exercise at Home (comment)  walk and stationary bike       Frequency Add 2 additional days to program exercise sessions.       Initial Home Exercises Provided 05/29/19               Exercise Comments:  Exercise Comments    Row Name 05/03/19 1642           Exercise Comments First full day of exercise!  Patient was oriented to gym and equipment including functions, settings, policies, and procedures.  Patient's individual exercise prescription and treatment plan were reviewed.  All starting workloads were established based on the results of the 6 minute walk test done at initial orientation visit.  The plan for exercise progression was also introduced and progression will be customized based on patient's performance and goals.              Exercise Goals and Review:  Exercise Goals    Row Name 05/01/19 1422             Exercise Goals   Increase Physical Activity Yes       Intervention Provide advice, education, support and counseling about physical activity/exercise needs.;Develop an individualized exercise prescription for aerobic and resistive training based on initial evaluation findings, risk stratification, comorbidities and participant's personal goals.       Expected Outcomes Short Term: Attend rehab on a regular basis to increase amount of physical activity.;Long Term: Add in home exercise to make exercise part of routine and to increase amount of physical activity.;Long Term: Exercising regularly at least 3-5 days a week.       Increase Strength and Stamina Yes       Intervention Provide advice, education, support and counseling  about physical activity/exercise needs.       Expected Outcomes Short Term: Increase workloads from initial exercise prescription for resistance, speed, and METs.;Short Term: Perform resistance training exercises routinely during rehab and add in resistance training at home;Long Term: Improve cardiorespiratory fitness, muscular endurance and strength as measured by increased METs and functional capacity (6MWT)       Able to understand and use rate of perceived exertion (RPE) scale Yes       Intervention Provide education and  explanation on how to use RPE scale       Expected Outcomes Short Term: Able to use RPE daily in rehab to express subjective intensity level;Long Term:  Able to use RPE to guide intensity level when exercising independently       Able to understand and use Dyspnea scale Yes       Intervention Provide education and explanation on how to use Dyspnea scale       Expected Outcomes Short Term: Able to use Dyspnea scale daily in rehab to express subjective sense of shortness of breath during exertion;Long Term: Able to use Dyspnea scale to guide intensity level when exercising independently       Knowledge and understanding of Target Heart Rate Range (THRR) Yes       Intervention Provide education and explanation of THRR including how the numbers were predicted and where they are located for reference       Expected Outcomes Short Term: Able to use daily as guideline for intensity in rehab;Long Term: Able to use THRR to govern intensity when exercising independently;Short Term: Able to state/look up THRR       Able to check pulse independently Yes       Intervention Provide education and demonstration on how to check pulse in carotid and radial arteries.;Review the importance of being able to check your own pulse for safety during independent exercise       Expected Outcomes Short Term: Able to explain why pulse checking is important during independent exercise;Long Term: Able to check pulse independently and accurately       Understanding of Exercise Prescription Yes       Intervention Provide education, explanation, and written materials on patient's individual exercise prescription       Expected Outcomes Short Term: Able to explain program exercise prescription;Long Term: Able to explain home exercise prescription to exercise independently              Exercise Goals Re-Evaluation :  Exercise Goals Re-Evaluation    Row Name 05/03/19 1642 05/24/19 1112 05/24/19 1545 05/29/19 1741 06/06/19 1417      Exercise Goal Re-Evaluation   Exercise Goals Review Able to understand and use rate of perceived exertion (RPE) scale;Able to understand and use Dyspnea scale;Knowledge and understanding of Target Heart Rate Range (THRR);Understanding of Exercise Prescription Increase Physical Activity;Increase Strength and Stamina;Able to understand and use rate of perceived exertion (RPE) scale;Able to understand and use Dyspnea scale;Knowledge and understanding of Target Heart Rate Range (THRR);Able to check pulse independently;Understanding of Exercise Prescription Increase Physical Activity;Increase Strength and Stamina;Able to understand and use rate of perceived exertion (RPE) scale;Able to understand and use Dyspnea scale;Knowledge and understanding of Target Heart Rate Range (THRR);Able to check pulse independently;Understanding of Exercise Prescription Increase Physical Activity;Increase Strength and Stamina;Able to understand and use rate of perceived exertion (RPE) scale;Able to understand and use Dyspnea scale;Knowledge and understanding of Target Heart Rate Range (THRR);Able to check pulse independently;Understanding of Exercise Prescription Increase  Physical Activity;Increase Strength and Stamina;Understanding of Exercise Prescription   Comments Reviewed RPE scale, THR and program prescription with pt today.  Pt voiced understanding and was given a copy of goals to take home. Lori Browning has been attending consistently and tolerates exercise well.  Staff willl monitor progress. Lori Browning has ben having knee pain starting last Friday 05/19/19; she has been using ice and it has been feeling better and the swelling has gone down. Jess talked to pt regarding ice and heat as well as exercise. Reviewed home exercise with pt today.  Pt plans to walk and use stationary bike for exercise.  Reviewed THR, pulse, RPE, sign and symptoms, NTG use, and when to call 911 or MD.  Also discussed weather considerations and indoor options.  Pt  voiced understanding. Lori Browning is doing well in rehab.  She is now walking the full time on the treadmill at 1.1 mph.  We will continue to monitor her progress.   Expected Outcomes Short: Use RPE daily to regulate intensity. Long: Follow program prescription in THR. Short:  continue to attend consistently Long:  increase overall MET level Short:  continue to attend consistently, ice leg and rest leg until feeling better.  Long:  increase overall MET level Short: add in a day between Wed and Monday sessions Long: maintain exercise on her own Short: Continue to increase workloads  Long: Continue to improve stamina.   [place] Name 06/14/19 1541 06/28/19 0855 07/13/19 1153 07/27/19 1608 08/07/19 1604     Exercise Goal Re-Evaluation   Exercise Goals Review Increase Physical Activity;Increase Strength and Stamina;Understanding of Exercise Prescription Increase Physical Activity;Increase Strength and Stamina;Understanding of Exercise Prescription Increase Physical Activity;Increase Strength and Stamina;Understanding of Exercise Prescription Increase Physical Activity;Increase Strength and Stamina;Understanding of Exercise Prescription Increase Physical Activity;Increase Strength and Stamina;Understanding of Exercise Prescription   Comments Lori Browning has been walking outside on days not at [facility].  She walks about 15 min.  We discussed adding one minute per week until she can do 20-30 min.  We discussed options for continuing to exercise after she finishes LW. Lori Browning has been doing well in rehab.  She is now on level 5 for the NuStep.  We will continue to monitor her progress. Lori Browning ws more short of breath today.  She said she had a big lunch with friends today and ate too much. Lori Browning has been doing well in rehab.  She is on level 5 on the T5 NuStep.  We will continue to monitor her progress. Lori Browning is doing well in rehab.  She is walking at home on her off days for about 15-20 min.  We talked about trying to stretch time to 30  min at home with walking or using videos.  She has not noticed  much improvement in her strength and stamina.   Expected Outcomes Short:  continue exercise at home Long:  maintain exercise Short: Increase T5 NuStep  Long: Continue to improve stamina. Short: continue to exercise consistently Long : increase overall stamina Short: Increase treadmill Long: Continue to improve stamina. Short: Increase exercise time to 30 min at home.  Long: Conitnue to improve breathing and stamina.   [place] Name 08/21/19 1508             Exercise Goal Re-Evaluation   Exercise Goals Review Increase Physical Activity;Increase Strength and Stamina;Understanding of Exercise Prescription       Comments With our schedule change, Lori Browning has needed to switch her class time.  She will be out this week  until someone graduates and with holidays, has only attended once since last review.  We will continue to montior her progress.       Expected Outcomes Short: Get adjusted to new class time to finish up program.  Long: Continue to improve stamina.              Discharge Exercise Prescription (Final Exercise Prescription Changes):  Exercise Prescription Changes - 08/21/19 1500      Response to Exercise   Blood Pressure (Admit) 142/64    Blood Pressure (Exercise) 150/72    Blood Pressure (Exit) 134/70    Heart Rate (Admit) 76 bpm    Heart Rate (Exercise) 86 bpm    Heart Rate (Exit) 72 bpm    Oxygen Saturation (Admit) 94 %    Oxygen Saturation (Exercise) 89 %    Oxygen Saturation (Exit) 93 %    Rating of Perceived Exertion (Exercise) 13    Perceived Dyspnea (Exercise) 2    Symptoms none    Duration Continue with 30 min of aerobic exercise without signs/symptoms of physical distress.    Intensity THRR unchanged      Progression   Progression Continue to progress workloads to maintain intensity without signs/symptoms of physical distress.    Average METs 2.1      Resistance Training   Training Prescription Yes     Weight 3 lb    Reps 10-15      Interval Training   Interval Training No      Treadmill   MPH 1.5    Grade 0    Minutes 15    METs 2.15      T5 Nustep   Level 5    Minutes 15    METs 2.1      Home Exercise Plan   Plans to continue exercise at Home (comment)   walk and stationary bike   Frequency Add 2 additional days to program exercise sessions.    Initial Home Exercises Provided 05/29/19           Nutrition:  Target Goals: Understanding of nutrition guidelines, daily intake of sodium <1565m, cholesterol <2027m calories 30% from fat and 7% or less from saturated fats, daily to have 5 or more servings of fruits and vegetables.  Education: Controlling Sodium/Reading Food Labels -Group verbal and written material supporting the discussion of sodium use in heart healthy nutrition. Review and explanation with models, verbal and written materials for utilization of the food label.   Education: General Nutrition Guidelines/Fats and Fiber: -Group instruction provided by verbal, written material, models and posters to present the general guidelines for heart healthy nutrition. Gives an explanation and review of dietary fats and fiber.   Biometrics:  Pre Biometrics - 05/01/19 1423      Pre Biometrics   Height 5' 6"  (1.676 m)    Weight 236 lb 11.2 oz (107.4 kg)    BMI (Calculated) 38.22    Single Leg Stand 0 seconds            Nutrition Therapy Plan and Nutrition Goals:  Nutrition Therapy & Goals - 06/26/19 1619      Personal Nutrition Goals   Comments She lives at home alone alone and mostly eats out. Breakfast is typically scrambled eggs, sausage, english muffin and black coffee; Lunch: fruit; Dinner: hibachi steak and white rice OR a vegetable plate. Pt does not add salt to food. [place] park, [employer], [employer], [employer], [employer],  [employer], delancies, etc. tries to watch what she eats when going out. Does not want to stop  going out. Discussed HH eating           Nutrition Assessments:   MEDIFICTS Score Key:          ?70 Need to make dietary changes          40-70 Heart Healthy Diet         ? 40 Therapeutic Level Cholesterol Diet  Nutrition Goals Re-Evaluation:  Nutrition Goals Re-Evaluation    [place] Name 06/26/19 1600 08/07/19 1610           Goals   Nutrition Goal ST: modify take out foods LT: Improve SOB, lose weight ST: modify take out foods LT: Improve SOB, lose weight      Comment Review take-out menus Lori Browning has really been watching what she eats more closely. She is really trying to cut back and make healthier options what she does eat out.  She is really cut back on her salt intake.      Expected Outcome ST: modify take out foods LT: Improve SOB, lose weight Short: Continue to work on eating out Long: Continue to work on weight loss.             Nutrition Goals Discharge (Final Nutrition Goals Re-Evaluation):  Nutrition Goals Re-Evaluation - 08/07/19 1610      Goals   Nutrition Goal ST: modify take out foods LT: Improve SOB, lose weight    Comment Lori Browning has really been watching what she eats more closely. She is really trying to cut back and make healthier options what she does eat out.  She is really cut back on her salt intake.    Expected Outcome Short: Continue to work on eating out Long: Continue to work on weight loss.           Psychosocial: Target Goals: Acknowledge presence or absence of significant depression and/or stress, maximize coping skills, provide positive support system. Participant is able to verbalize types and ability to use techniques and skills needed for reducing stress and depression.   Education: Depression - Provides group verbal and written instruction on the correlation between heart/lung disease and depressed mood, treatment options, and the stigmas associated with seeking treatment.   Education: Sleep Hygiene -Provides group verbal and written  instruction about how sleep can affect your health.  Define sleep hygiene, discuss sleep cycles and impact of sleep habits. Review good sleep hygiene tips.    Education: Stress and Anxiety: - Provides group verbal and written instruction about the health risks of elevated stress and causes of high stress.  Discuss the correlation between heart/lung disease and anxiety and treatment options. Review healthy ways to manage with stress and anxiety.   Initial Review & Psychosocial Screening:  Initial Psych Review & Screening - 04/27/19 1110      Initial Review   Current issues with None Identified      Family Dynamics   Good Support System? Yes    Comments She can look to her nephew and brother for support.      Barriers   Psychosocial barriers to participate in program The patient should benefit from training in stress management and relaxation.;There are no identifiable barriers or psychosocial needs.      Screening Interventions   Interventions Encouraged to exercise;Provide feedback about the scores to participant;To provide support and resources with identified psychosocial needs    Expected Outcomes Short Term goal:  Utilizing psychosocial counselor, staff and physician to assist with identification of specific Stressors or current issues interfering with healing process. Setting desired goal for each stressor or current issue identified.;Long Term Goal: Stressors or current issues are controlled or eliminated.;Short Term goal: Identification and review with participant of any Quality of Life or Depression concerns found by scoring the questionnaire.;Long Term goal: The participant improves quality of Life and PHQ9 Scores as seen by post scores and/or verbalization of changes           Quality of Life Scores:  Scores of 19 and below usually indicate a poorer quality of life in these areas.  A difference of  2-3 points is a clinically meaningful difference.  A difference of 2-3 points in  the total score of the Quality of Life Index has been associated with significant improvement in overall quality of life, self-image, physical symptoms, and general health in studies assessing change in quality of life.  PHQ-9: Recent Review Flowsheet Data    Depression screen [facility] 2/9 05/01/2019   Decreased Interest 0   Down, Depressed, Hopeless 0   PHQ - 2 Score 0   Altered sleeping 0   Tired, decreased energy 1   Change in appetite 0   Feeling bad or failure about yourself  0   Trouble concentrating 0   Moving slowly or fidgety/restless 0   PHQ-9 Score 1   Difficult doing work/chores Not difficult at all     Interpretation of Total Score  Total Score Depression Severity:  1-4 = Minimal depression, 5-9 = Mild depression, 10-14 = Moderate depression, 15-19 = Moderately severe depression, 20-27 = Severe depression   Psychosocial Evaluation and Intervention:  Psychosocial Evaluation - 04/27/19 1113      Psychosocial Evaluation & Interventions   Interventions Encouraged to exercise with the program and follow exercise prescription    Comments Patient states that she is not depressed and has a positive outlook on life.    Expected Outcomes Short: Attend [facility] stress management education to decrease stress. Long: Maintain exercise Post [facility] to keep stress at a minimum.    Continue Psychosocial Services  Follow up required by staff           Psychosocial Re-Evaluation:  Psychosocial Re-Evaluation    [place] Name 05/24/19 1601 06/14/19 1539 08/07/19 1606         Psychosocial Re-Evaluation   Current issues with None Identified -- None Identified     Comments Pt reports no problems. [place] reports no stress cooncerns and she sleeps well most of the time. Lori Browning is doing well in rehab.   She has no major stressors and sleeps well.     Expected Outcomes Continue to manage stress levels. Short: conintue to exercise Long: maintain positive outlook Short: conintue to exercise Long:  maintain positive outlook     Interventions Encouraged to attend Pulmonary Rehabilitation for the exercise -- Encouraged to attend Pulmonary Rehabilitation for the exercise     Continue Psychosocial Services  Follow up required by staff -- Follow up required by staff            Psychosocial Discharge (Final Psychosocial Re-Evaluation):  Psychosocial Re-Evaluation - 08/07/19 1606      Psychosocial Re-Evaluation   Current issues with None Identified    Comments Lori Browning is doing well in rehab.   She has no major stressors and sleeps well.    Expected Outcomes Short: conintue to exercise Long: maintain positive outlook    Interventions Encouraged  to attend Pulmonary Rehabilitation for the exercise    Continue Psychosocial Services  Follow up required by staff           Education: Education Goals: Education classes will be provided on a weekly basis, covering required topics. Participant will state understanding/return demonstration of topics presented.  Learning Barriers/Preferences:  Learning Barriers/Preferences - 04/27/19 1111      Learning Barriers/Preferences   Learning Barriers None    Learning Preferences None           General Pulmonary Education Topics:  Infection Prevention: - Provides verbal and written material to individual with discussion of infection control including proper hand washing and proper equipment cleaning during exercise session.   Pulmonary Rehab from 05/01/2019 in [facility] Cardiac and Pulmonary Rehab  Date 05/01/19  Educator AS  Instruction Review Code 1- Verbalizes Understanding      Falls Prevention: - Provides verbal and written material to individual with discussion of falls prevention and safety.   Pulmonary Rehab from 05/01/2019 in [facility] Cardiac and Pulmonary Rehab  Date 05/01/19  Educator AS  Instruction Review Code 1- Verbalizes Understanding      Chronic Lung Diseases: - Group verbal and written instruction to review updates,  respiratory medications, advancements in procedures and treatments. Discuss use of supplemental oxygen including available portable oxygen systems, continuous and intermittent flow rates, concentrators, personal use and safety guidelines. Review proper use of inhaler and spacers. Provide informative websites for self-education.    Energy Conservation: - Provide group verbal and written instruction for methods to conserve energy, plan and organize activities. Instruct on pacing techniques, use of adaptive equipment and posture/positioning to relieve shortness of breath.   Triggers and Exacerbations: - Group verbal and written instruction to review types of environmental triggers and ways to prevent exacerbations. Discuss weather changes, air quality and the benefits of nasal washing. Review warning signs and symptoms to help prevent infections. Discuss techniques for effective airway clearance, coughing, and vibrations.   AED/CPR: - Group verbal and written instruction with the use of models to demonstrate the basic use of the AED with the basic ABC's of resuscitation.   Anatomy and Physiology of the Lungs: - Group verbal and written instruction with the use of models to provide basic lung anatomy and physiology related to function, structure and complications of lung disease.   Anatomy & Physiology of the Heart: - Group verbal and written instruction and models provide basic cardiac anatomy and physiology, with the coronary electrical and arterial systems. Review of Valvular disease and Heart Failure   Cardiac Medications: - Group verbal and written instruction to review commonly prescribed medications for heart disease. Reviews the medication, class of the drug, and side effects.   Other: -Provides group and verbal instruction on various topics (see comments)   Knowledge Questionnaire Score:  Knowledge Questionnaire Score - 05/01/19 1425      Knowledge Questionnaire Score   Pre  Score 13/18            Core Components/Risk Factors/Patient Goals at Admission:  Personal Goals and Risk Factors at Admission - 05/01/19 1423      Core Components/Risk Factors/Patient Goals on Admission    Weight Management Yes;Weight Loss;Weight Maintenance    Intervention Weight Management: Develop a combined nutrition and exercise program designed to reach desired caloric intake, while maintaining appropriate intake of nutrient and fiber, sodium and fats, and appropriate energy expenditure required for the weight goal.;Weight Management/Obesity: Establish reasonable short term and long term weight goals.;Weight Management:  Provide education and appropriate resources to help participant work on and attain dietary goals.    Admit Weight 236 lb 11.2 oz (107.4 kg)    Goal Weight: Short Term 225 lb (102.1 kg)    Goal Weight: Long Term 215 lb (97.5 kg)    Expected Outcomes Long Term: Adherence to nutrition and physical activity/exercise program aimed toward attainment of established weight goal;Short Term: Continue to assess and modify interventions until short term weight is achieved;Weight Maintenance: Understanding of the daily nutrition guidelines, which includes 25-35% calories from fat, 7% or less cal from saturated fats, less than 265m cholesterol, less than 1.5gm of sodium, & 5 or more servings of fruits and vegetables daily;Weight Loss: Understanding of general recommendations for a balanced deficit meal plan, which promotes 1-2 lb weight loss per week and includes a negative energy balance of [phone] kcal/d;Understanding recommendations for meals to include 15-35% energy as protein, 25-35% energy from fat, 35-60% energy from carbohydrates, less than 2069mof dietary cholesterol, 20-35 gm of total fiber daily;Understanding of distribution of calorie intake throughout the day with the consumption of 4-5 meals/snacks    Intervention Provide education, individualized exercise plan and daily  activity instruction to help decrease symptoms of SOB with activities of daily living.    Expected Outcomes Short Term: Improve cardiorespiratory fitness to achieve a reduction of symptoms when performing ADLs;Long Term: Be able to perform more ADLs without symptoms or delay the onset of symptoms    Diabetes Yes    Intervention Provide education about signs/symptoms and action to take for hypo/hyperglycemia.;Provide education about proper nutrition, including hydration, and aerobic/resistive exercise prescription along with prescribed medications to achieve blood glucose in normal ranges: Fasting glucose 65-99 mg/dL    Expected Outcomes Short Term: Participant verbalizes understanding of the signs/symptoms and immediate care of hyper/hypoglycemia, proper foot care and importance of medication, aerobic/resistive exercise and nutrition plan for blood glucose control.;Long Term: Attainment of HbA1C < 7%.    Hypertension Yes    Intervention Provide education on lifestyle modifcations including regular physical activity/exercise, weight management, moderate sodium restriction and increased consumption of fresh fruit, vegetables, and low fat dairy, alcohol moderation, and smoking cessation.;Monitor prescription use compliance.           Education:Diabetes - Individual verbal and written instruction to review signs/symptoms of diabetes, desired ranges of glucose level fasting, after meals and with exercise. Acknowledge that pre and post exercise glucose checks will be done for 3 sessions at entry of program.   [facility]  Date 05/01/19  Educator AS  Instruction Review Code 1- Verbalizes Understanding      Education: Know Your Numbers and Risk Factors: -Group verbal and written instruction about important numbers in your health.  Discussion of what are risk factors and how they play a role in the disease process.  Review of Cholesterol, Blood  Pressure, Diabetes, and BMI and the role they play in your overall health.   Core Components/Risk Factors/Patient Goals Review:   Goals and Risk Factor Review    [place] Name 05/24/19 1602 06/14/19 1537 08/07/19 1607         Core Components/Risk Factors/Patient Goals Review   Personal Goals Review Weight Management/Obesity;Develop more efficient breathing techniques such as purse lipped breathing and diaphragmatic breathing and practicing self-pacing with activity.;Hypertension;Improve shortness of breath with ADL's Weight Management/Obesity;Develop more efficient breathing techniques such as purse lipped breathing and diaphragmatic breathing and practicing self-pacing with activity.;Hypertension;Improve shortness of breath  with ADL's Weight Management/Obesity;Hypertension;Improve shortness of breath with ADL's;Diabetes;Lipids     Review Pt reports talking medication as directed, does not take BP at home, but has a cuff; encouraged to take daily at least when not at rehab. Pt reports ADL is hard, but manageable. Lori Browning is taking meds as directed.  She has started checking BP at home and it has been good.  She hasnt been able to tell a big difference in shortness of breath with ADLs.  She does feel stronger in her legs. Lori Browning is doing well in rehab.  Her weight has started to creep up, but she is taking more lasix now.  She really wants to lose more.  Her breathing is not making much improvement yet.  Her legs are stronger, but not breathing better. She is using her PLB, but still struggling.  Her sugars have been good.  Her blood pressures have been good. Other than her breathing and weight gain, she feels good overall.     Expected Outcomes ST: take BP regularly. Short: continue to monitor BP at home Long: monitor risk factors Short: Continue to work on weight loss Long: Continue to  monitor risk factors.            Core Components/Risk Factors/Patient Goals at Discharge (Final Review):   Goals and  Risk Factor Review - 08/07/19 1607      Core Components/Risk Factors/Patient Goals Review   Personal Goals Review Weight Management/Obesity;Hypertension;Improve shortness of breath with ADL's;Diabetes;Lipids    Review Lori Browning is doing well in rehab.  Her weight has started to creep up, but she is taking more lasix now.  She really wants to lose more.  Her breathing is not making much improvement yet.  Her legs are stronger, but not breathing better. She is using her PLB, but still struggling.  Her sugars have been good.  Her blood pressures have been good. Other than her breathing and weight gain, she feels good overall.    Expected Outcomes Short: Continue to work on weight loss Long: Continue to  monitor risk factors.           ITP Comments:  ITP Comments    [place] Name 04/27/19 1114 05/03/19 0614 05/03/19 1642 05/25/19 1341 05/31/19 0541   ITP Comments Virtual Orientation performed. Patient informed when to come in for RD and EP orientation. Diagnosis [facility] be found in [facility] 04/20/2019 30 day chart review completed. ITP sent to Dr Zachery Dakins Medical Director, for review,changes as needed and signature. Continue with ITP if no changes requested First full day of exercise!  Patient was oriented to gym and equipment including functions, settings, policies, and procedures.  Patient's individual exercise prescription and treatment plan were reviewed.  All starting workloads were established based on the results of the 6 minute walk test done at initial orientation visit.  The plan for exercise progression was also introduced and progression will be customized based on patient's performance and goals. Completed Initial RD Eval 30 Day review completed. Medical Director review done, changes made as directed,and approval shown by signature of Market researcher.   Row Name 06/28/19 0554 07/26/19 0551 08/23/19 0641       ITP Comments 30 Day review completed. ITP review done, changes made as directed,and  approval shown by signature of  Scientist, research (life sciences). 30 Day review completed. Medical Director ITP review done, changes made as directed, and signed approval by Medical Director. 30 Day review completed. Medical Director ITP review done, changes made as  directed, and signed approval by Medical Director.            Comments:

## 2019-08-28 ENCOUNTER — Encounter: Payer: Medicare HMO | Admitting: *Deleted

## 2019-08-28 ENCOUNTER — Other Ambulatory Visit: Payer: Self-pay

## 2019-08-28 DIAGNOSIS — J449 Chronic obstructive pulmonary disease, unspecified: Secondary | ICD-10-CM | POA: Diagnosis not present

## 2019-08-28 NOTE — Progress Notes (Signed)
Daily Session Note  Patient Details  Name: Lori Browning MRN: [id number] Date of Birth: May 20, 1930 Referring Provider:     Pulmonary Rehab from 05/01/2019 in [facility] Cardiac and Pulmonary Rehab  Referring Provider [name]      Encounter Date: 08/28/2019  Check In:  Session Check In - 08/28/19 1331      Check-In   Supervising physician immediately available to respond to emergencies See telemetry face sheet for immediately available ER MD    Location [facility]-Cardiac & Pulmonary Rehab    Staff Present Renita Papa, RN Margurite Auerbach, MS Exercise Physiologist;Kelly Amedeo Plenty, BS, ACSM CEP, Exercise Physiologist    Virtual Visit No    Medication changes reported     No    Fall or balance concerns reported    No    Warm-up and Cool-down Performed on first and last piece of equipment    Resistance Training Performed Yes    VAD Patient? No    PAD/SET Patient? No      Pain Assessment   Currently in Pain? No/denies              Social History   Tobacco Use  Smoking Status Never Smoker  Smokeless Tobacco Never Used    Goals Met:  Independence with exercise equipment Exercise tolerated well No report of cardiac concerns or symptoms Strength training completed today  Goals Unmet:  Not Applicable  Comments: Pt able to follow exercise prescription today without complaint.  Will continue to monitor for progression.    Dr. Emily Filbert is Medical Director for [place] and [facility].

## 2019-08-30 ENCOUNTER — Encounter: Payer: Medicare HMO | Admitting: *Deleted

## 2019-08-30 ENCOUNTER — Other Ambulatory Visit: Payer: Self-pay

## 2019-08-30 DIAGNOSIS — J449 Chronic obstructive pulmonary disease, unspecified: Secondary | ICD-10-CM | POA: Diagnosis not present

## 2019-08-30 NOTE — Progress Notes (Signed)
Daily Session Note  Patient Details  Name: Lori Browning MRN: [id number] Date of Birth: 10/27/1930 Referring Provider:     Pulmonary Rehab from 05/01/2019 in [facility] Cardiac and Pulmonary Rehab  Referring Provider [name]      Encounter Date: 08/30/2019  Check In:  Session Check In - 08/30/19 1338      Check-In   Supervising physician immediately available to respond to emergencies See telemetry face sheet for immediately available ER MD    Location [facility]-Cardiac & Pulmonary Rehab    Staff Present Renita Papa, RN Margurite Auerbach, MS Exercise Physiologist;Amanda Oletta Darter, [place], ACSM CEP, Exercise Physiologist    Virtual Visit No    Medication changes reported     No    Fall or balance concerns reported    No    Warm-up and Cool-down Performed on first and last piece of equipment    Resistance Training Performed Yes    VAD Patient? No    PAD/SET Patient? No      Pain Assessment   Currently in Pain? No/denies              Social History   Tobacco Use  Smoking Status Never Smoker  Smokeless Tobacco Never Used    Goals Met:  Independence with exercise equipment Exercise tolerated well No report of cardiac concerns or symptoms Strength training completed today  Goals Unmet:  Not Applicable  Comments: Pt able to follow exercise prescription today without complaint.  Will continue to monitor for progression.    Dr. Emily Filbert is Medical Director for [place] and [facility].

## 2019-09-04 ENCOUNTER — Encounter: Payer: Medicare HMO | Admitting: *Deleted

## 2019-09-04 ENCOUNTER — Other Ambulatory Visit: Payer: Self-pay

## 2019-09-04 VITALS — Ht 66.0 in | Wt 234.6 lb

## 2019-09-04 DIAGNOSIS — J449 Chronic obstructive pulmonary disease, unspecified: Secondary | ICD-10-CM

## 2019-09-04 NOTE — Progress Notes (Signed)
Daily Session Note  Patient Details  Name: Lori Browning MRN: [id number] Date of Birth: Aug 25, 1930 Referring Provider:     Pulmonary Rehab from 05/01/2019 in [facility] Cardiac and Pulmonary Rehab  Referring Provider Lori Browning      Encounter Date: 09/04/2019  Check In:  Session Check In - 09/04/19 1338      Check-In   Supervising physician immediately available to respond to emergencies See telemetry face sheet for immediately available ER MD    Location [facility]-Cardiac & Pulmonary Rehab    Staff Present Renita Papa, RN BSN;Joseph [address] [place], [place], [place], CCRP, [place], [place], ACSM CEP, Exercise Physiologist    Virtual Visit No    Medication changes reported     No    Fall or balance concerns reported    No    Warm-up and Cool-down Performed on first and last piece of equipment    Resistance Training Performed Yes    VAD Patient? No    PAD/SET Patient? No      Pain Assessment   Currently in Pain? No/denies              Social History   Tobacco Use  Smoking Status Never Smoker  Smokeless Tobacco Never Used    Goals Met:  Independence with exercise equipment Exercise tolerated well No report of cardiac concerns or symptoms Strength training completed today  Goals Unmet:  Not Applicable  Comments: Pt able to follow exercise prescription today without complaint.  Will continue to monitor for progression.   Lori Browning Name 05/01/19 1409 09/04/19 1347       6 Minute Walk   Phase Initial Discharge    Distance 600 feet 715 feet    Distance % Change -- 19.7 %    Distance Feet Change -- 115 ft    Walk Time 5 minutes 6 minutes    # of Rest Breaks 2 0    MPH 1.36 1.35    METS 1.77 0.42    RPE 15 15    Perceived Dyspnea  3 3    VO2 Peak 1.25 1.44    Symptoms Yes (comment) Yes (comment)    Comments shortness of breath back pain 4/10, SOB    Resting HR 65 bpm 60 bpm    Resting BP 124/74 124/60    Resting Oxygen Saturation  96  % 94 %    Exercise Oxygen Saturation  during 6 min walk 90 % 84 %    Max Ex. HR 96 bpm 97 bpm    Max Ex. BP 164/76 136/64    2 Minute Post BP 152/78 134/70      Interval HR   1 Minute HR 83 --    2 Minute HR 91 --    3 Minute HR 94 --    4 Minute HR 80  resting --    5 Minute HR 93 --    6 Minute HR 96 --    2 Minute Post HR 70 --    Interval Heart Rate? Yes --      Interval Oxygen   Interval Oxygen? Yes --    1 Minute Oxygen Saturation % 92 % --    1 Minute Liters of Oxygen 0 L --    2 Minute Oxygen Saturation % 90 % --    2 Minute Liters of Oxygen 0 L --    3 Minute Oxygen Saturation % 90 % --  3 Minute Liters of Oxygen 0 L --    4 Minute Oxygen Saturation % 90 % --    4 Minute Liters of Oxygen 0 L --    5 Minute Oxygen Saturation % 92 % --    5 Minute Liters of Oxygen 0 L --    6 Minute Oxygen Saturation % 90 % --    6 Minute Liters of Oxygen 0 L --    2 Minute Post Oxygen Saturation % 96 % --    2 Minute Post Liters of Oxygen 0 L --           Dr. Emily Filbert is Medical Director for [place] and [facility].

## 2019-09-06 ENCOUNTER — Encounter: Payer: Medicare HMO | Admitting: *Deleted

## 2019-09-06 ENCOUNTER — Other Ambulatory Visit: Payer: Self-pay

## 2019-09-06 DIAGNOSIS — J449 Chronic obstructive pulmonary disease, unspecified: Secondary | ICD-10-CM | POA: Diagnosis not present

## 2019-09-06 NOTE — Progress Notes (Signed)
Daily Session Note  Patient Details  Name: MEEYA GOLDIN MRN: [id number] Date of Birth: Oct 29, 1930 Referring Provider:     Pulmonary Rehab from 05/01/2019 in [facility] Cardiac and Pulmonary Rehab  Referring Provider [name]      Encounter Date: 09/06/2019  Check In:  Session Check In - 09/06/19 1332      Check-In   Supervising physician immediately available to respond to emergencies See telemetry face sheet for immediately available ER MD    Location [facility]-Cardiac & Pulmonary Rehab    Staff Present Renita Papa, RN BSN;Joseph [address] [place], [place], [place], CCRP, [place], [place], RRT, CPFT    Virtual Visit No    Medication changes reported     No    Fall or balance concerns reported    No    Warm-up and Cool-down Performed on first and last piece of equipment    Resistance Training Performed Yes    VAD Patient? No    PAD/SET Patient? No      Pain Assessment   Currently in Pain? No/denies              Social History   Tobacco Use  Smoking Status Never Smoker  Smokeless Tobacco Never Used    Goals Met:  Independence with exercise equipment Exercise tolerated well No report of cardiac concerns or symptoms Strength training completed today  Goals Unmet:  Not Applicable  Comments: Pt able to follow exercise prescription today without complaint.  Will continue to monitor for progression.    Dr. Emily Filbert is Medical Director for [place] and [facility].

## 2019-09-11 ENCOUNTER — Encounter: Payer: Medicare HMO | Attending: Pulmonary Disease | Admitting: *Deleted

## 2019-09-11 ENCOUNTER — Other Ambulatory Visit: Payer: Self-pay

## 2019-09-11 DIAGNOSIS — J449 Chronic obstructive pulmonary disease, unspecified: Secondary | ICD-10-CM | POA: Diagnosis not present

## 2019-09-11 NOTE — Progress Notes (Signed)
Daily Session Note  Patient Details  Name: BRELYNN WHELLER MRN: [id number] Date of Birth: 11-04-1930 Referring Provider:     Pulmonary Rehab from 05/01/2019 in [facility] Cardiac and Pulmonary Rehab  Referring Provider [name]      Encounter Date: 09/11/2019  Check In:  Session Check In - 09/11/19 1342      Check-In   Supervising physician immediately available to respond to emergencies See telemetry face sheet for immediately available ER MD    Location [facility]-Cardiac & Pulmonary Rehab    Staff Present Renita Papa, RN Margurite Auerbach, MS Exercise Physiologist;Kelly Amedeo Plenty, BS, ACSM CEP, Exercise Physiologist    Virtual Visit No    Medication changes reported     No    Fall or balance concerns reported    No    Warm-up and Cool-down Performed on first and last piece of equipment    Resistance Training Performed Yes    VAD Patient? No    PAD/SET Patient? No      Pain Assessment   Currently in Pain? No/denies              Social History   Tobacco Use  Smoking Status Never Smoker  Smokeless Tobacco Never Used    Goals Met:  Independence with exercise equipment Exercise tolerated well No report of cardiac concerns or symptoms Strength training completed today  Goals Unmet:  Not Applicable  Comments: Pt able to follow exercise prescription today without complaint.  Will continue to monitor for progression.    Dr. Emily Filbert is Medical Director for [place] and [facility].

## 2019-09-12 NOTE — Patient Instructions (Signed)
Discharge Patient Instructions  Patient Details  Name: Lori Browning MRN: [id number] Date of Birth: 10-20-1930 Referring Provider:  Ottie Glazier, MD   Number of Visits: 43  Reason for Discharge:  Patient reached a stable level of exercise. Patient independent in their exercise. Patient has met program and personal goals.  Smoking History:  Social History   Tobacco Use  Smoking Status Never Smoker  Smokeless Tobacco Never Used    Diagnosis:  Chronic obstructive pulmonary disease, unspecified COPD type ([place])  Initial Exercise Prescription:  Initial Exercise Prescription - 05/01/19 1400      Date of Initial Exercise RX and Referring Provider   Date 05/01/19    Referring Provider Aleskerov      Treadmill   MPH 0.8    Grade 0    Minutes 15    METs 1      NuStep   Level 1    SPM 80    Minutes 15    METs 1      REL-XR   Level 1    Speed 50    Minutes 15    METs 1      T5 Nustep   Level 1    SPM 80    Minutes 15      Prescription Details   Frequency (times per week) 3    Duration Progress to 30 minutes of continuous aerobic without signs/symptoms of physical distress      Intensity   THRR 40-80% of Max Heartrate 93-120    Ratings of Perceived Exertion 11-13    Perceived Dyspnea 0-4      Resistance Training   Training Prescription Yes    Weight 3 lb    Reps 10-15           Discharge Exercise Prescription (Final Exercise Prescription Changes):  Exercise Prescription Changes - 09/06/19 1400      Response to Exercise   Blood Pressure (Admit) 124/60    Blood Pressure (Exercise) 136/64    Blood Pressure (Exit) 132/70    Heart Rate (Admit) 66 bpm    Heart Rate (Exercise) 97 bpm    Heart Rate (Exit) 71 bpm    Oxygen Saturation (Admit) 94 %    Oxygen Saturation (Exercise) 84 %    Oxygen Saturation (Exit) 93 %    Rating of Perceived Exertion (Exercise) 15    Perceived Dyspnea (Exercise) 3    Symptoms none    Duration Continue with 30 min of  aerobic exercise without signs/symptoms of physical distress.    Intensity THRR unchanged      Progression   Progression Continue to progress workloads to maintain intensity without signs/symptoms of physical distress.    Average METs 2.5      Resistance Training   Training Prescription Yes    Weight 3 lb    Reps 10-15      Interval Training   Interval Training No      NuStep   Level 5    SPM 80    Minutes 15    METs 2           Functional Capacity:  6 Minute Walk    Row Name 05/01/19 1409 09/04/19 1347       6 Minute Walk   Phase Initial Discharge    Distance 600 feet 715 feet    Distance % Change -- 19.7 %    Distance Feet Change -- 115 ft    Walk Time 5 minutes  6 minutes    # of Rest Breaks 2 0    MPH 1.36 1.35    METS 1.77 0.42    RPE 15 15    Perceived Dyspnea  3 3    VO2 Peak 1.25 1.44    Symptoms Yes (comment) Yes (comment)    Comments shortness of breath back pain 4/10, SOB    Resting HR 65 bpm 60 bpm    Resting BP 124/74 124/60    Resting Oxygen Saturation  96 % 94 %    Exercise Oxygen Saturation  during 6 min walk 90 % 84 %    Max Ex. HR 96 bpm 97 bpm    Max Ex. BP 164/76 136/64    2 Minute Post BP 152/78 134/70      Interval HR   1 Minute HR 83 --    2 Minute HR 91 --    3 Minute HR 94 --    4 Minute HR 80  resting --    5 Minute HR 93 --    6 Minute HR 96 --    2 Minute Post HR 70 --    Interval Heart Rate? Yes --      Interval Oxygen   Interval Oxygen? Yes --    1 Minute Oxygen Saturation % 92 % --    1 Minute Liters of Oxygen 0 L --    2 Minute Oxygen Saturation % 90 % --    2 Minute Liters of Oxygen 0 L --    3 Minute Oxygen Saturation % 90 % --    3 Minute Liters of Oxygen 0 L --    4 Minute Oxygen Saturation % 90 % --    4 Minute Liters of Oxygen 0 L --    5 Minute Oxygen Saturation % 92 % --    5 Minute Liters of Oxygen 0 L --    6 Minute Oxygen Saturation % 90 % --    6 Minute Liters of Oxygen 0 L --    2 Minute Post  Oxygen Saturation % 96 % --    2 Minute Post Liters of Oxygen 0 L --           Quality of Life:   Personal Goals: Goals established at orientation with interventions provided to work toward goal.  Personal Goals and Risk Factors at Admission - 05/01/19 1423      Core Components/Risk Factors/Patient Goals on Admission    Weight Management Yes;Weight Loss;Weight Maintenance    Intervention Weight Management: Develop a combined nutrition and exercise program designed to reach desired caloric intake, while maintaining appropriate intake of nutrient and fiber, sodium and fats, and appropriate energy expenditure required for the weight goal.;Weight Management/Obesity: Establish reasonable short term and long term weight goals.;Weight Management: Provide education and appropriate resources to help participant work on and attain dietary goals.    Admit Weight 236 lb 11.2 oz (107.4 kg)    Goal Weight: Short Term 225 lb (102.1 kg)    Goal Weight: Long Term 215 lb (97.5 kg)    Expected Outcomes Long Term: Adherence to nutrition and physical activity/exercise program aimed toward attainment of established weight goal;Short Term: Continue to assess and modify interventions until short term weight is achieved;Weight Maintenance: Understanding of the daily nutrition guidelines, which includes 25-35% calories from fat, 7% or less cal from saturated fats, less than 265m cholesterol, less than 1.5gm of sodium, & 5 or more servings of fruits  and vegetables daily;Weight Loss: Understanding of general recommendations for a balanced deficit meal plan, which promotes 1-2 lb weight loss per week and includes a negative energy balance of [phone] kcal/d;Understanding recommendations for meals to include 15-35% energy as protein, 25-35% energy from fat, 35-60% energy from carbohydrates, less than 267m of dietary cholesterol, 20-35 gm of total fiber daily;Understanding of distribution of calorie intake throughout the day  with the consumption of 4-5 meals/snacks    Intervention Provide education, individualized exercise plan and daily activity instruction to help decrease symptoms of SOB with activities of daily living.    Expected Outcomes Short Term: Improve cardiorespiratory fitness to achieve a reduction of symptoms when performing ADLs;Long Term: Be able to perform more ADLs without symptoms or delay the onset of symptoms    Diabetes Yes    Intervention Provide education about signs/symptoms and action to take for hypo/hyperglycemia.;Provide education about proper nutrition, including hydration, and aerobic/resistive exercise prescription along with prescribed medications to achieve blood glucose in normal ranges: Fasting glucose 65-99 mg/dL    Expected Outcomes Short Term: Participant verbalizes understanding of the signs/symptoms and immediate care of hyper/hypoglycemia, proper foot care and importance of medication, aerobic/resistive exercise and nutrition plan for blood glucose control.;Long Term: Attainment of HbA1C < 7%.    Hypertension Yes    Intervention Provide education on lifestyle modifcations including regular physical activity/exercise, weight management, moderate sodium restriction and increased consumption of fresh fruit, vegetables, and low fat dairy, alcohol moderation, and smoking cessation.;Monitor prescription use compliance.            Personal Goals Discharge:  Goals and Risk Factor Review - 09/04/19 1425      Core Components/Risk Factors/Patient Goals Review   Personal Goals Review Weight Management/Obesity;Hypertension;Improve shortness of breath with ADL's;Diabetes;Lipids    Review NKeshaylahas done well in rehab.  Her weight is steady and she plans to continue to lose weight.  She continues to work on her breathing and pressures.  She continues to montior her sugars too.  She will be graduating next week and plans to continue to keep a close eye on her risk factors.    Expected  Outcomes Continue to work on weight loss and monitor risk factors.           Exercise Goals and Review:  Exercise Goals    Row Name 05/01/19 1422             Exercise Goals   Increase Physical Activity Yes       Intervention Provide advice, education, support and counseling about physical activity/exercise needs.;Develop an individualized exercise prescription for aerobic and resistive training based on initial evaluation findings, risk stratification, comorbidities and participant's personal goals.       Expected Outcomes Short Term: Attend rehab on a regular basis to increase amount of physical activity.;Long Term: Add in home exercise to make exercise part of routine and to increase amount of physical activity.;Long Term: Exercising regularly at least 3-5 days a week.       Increase Strength and Stamina Yes       Intervention Provide advice, education, support and counseling about physical activity/exercise needs.       Expected Outcomes Short Term: Increase workloads from initial exercise prescription for resistance, speed, and METs.;Short Term: Perform resistance training exercises routinely during rehab and add in resistance training at home;Long Term: Improve cardiorespiratory fitness, muscular endurance and strength as measured by increased METs and functional capacity (6MWT)       Able  to understand and use rate of perceived exertion (RPE) scale Yes       Intervention Provide education and explanation on how to use RPE scale       Expected Outcomes Short Term: Able to use RPE daily in rehab to express subjective intensity level;Long Term:  Able to use RPE to guide intensity level when exercising independently       Able to understand and use Dyspnea scale Yes       Intervention Provide education and explanation on how to use Dyspnea scale       Expected Outcomes Short Term: Able to use Dyspnea scale daily in rehab to express subjective sense of shortness of breath during  exertion;Long Term: Able to use Dyspnea scale to guide intensity level when exercising independently       Knowledge and understanding of Target Heart Rate Range (THRR) Yes       Intervention Provide education and explanation of THRR including how the numbers were predicted and where they are located for reference       Expected Outcomes Short Term: Able to use daily as guideline for intensity in rehab;Long Term: Able to use THRR to govern intensity when exercising independently;Short Term: Able to state/look up THRR       Able to check pulse independently Yes       Intervention Provide education and demonstration on how to check pulse in carotid and radial arteries.;Review the importance of being able to check your own pulse for safety during independent exercise       Expected Outcomes Short Term: Able to explain why pulse checking is important during independent exercise;Long Term: Able to check pulse independently and accurately       Understanding of Exercise Prescription Yes       Intervention Provide education, explanation, and written materials on patient's individual exercise prescription       Expected Outcomes Short Term: Able to explain program exercise prescription;Long Term: Able to explain home exercise prescription to exercise independently              Exercise Goals Re-Evaluation:  Exercise Goals Re-Evaluation    Row Name 05/03/19 1642 05/24/19 1112 05/24/19 1545 05/29/19 1741 06/06/19 1417     Exercise Goal Re-Evaluation   Exercise Goals Review Able to understand and use rate of perceived exertion (RPE) scale;Able to understand and use Dyspnea scale;Knowledge and understanding of Target Heart Rate Range (THRR);Understanding of Exercise Prescription Increase Physical Activity;Increase Strength and Stamina;Able to understand and use rate of perceived exertion (RPE) scale;Able to understand and use Dyspnea scale;Knowledge and understanding of Target Heart Rate Range (THRR);Able to  check pulse independently;Understanding of Exercise Prescription Increase Physical Activity;Increase Strength and Stamina;Able to understand and use rate of perceived exertion (RPE) scale;Able to understand and use Dyspnea scale;Knowledge and understanding of Target Heart Rate Range (THRR);Able to check pulse independently;Understanding of Exercise Prescription Increase Physical Activity;Increase Strength and Stamina;Able to understand and use rate of perceived exertion (RPE) scale;Able to understand and use Dyspnea scale;Knowledge and understanding of Target Heart Rate Range (THRR);Able to check pulse independently;Understanding of Exercise Prescription Increase Physical Activity;Increase Strength and Stamina;Understanding of Exercise Prescription   Comments Reviewed RPE scale, THR and program prescription with pt today.  Pt voiced understanding and was given a copy of goals to take home. Taneah has been attending consistently and tolerates exercise well.  Staff willl monitor progress. Alleah has ben having knee pain starting last Friday 05/19/19; she has been using ice and  it has been feeling better and the swelling has gone down. Jess talked to pt regarding ice and heat as well as exercise. Reviewed home exercise with pt today.  Pt plans to walk and use stationary bike for exercise.  Reviewed THR, pulse, RPE, sign and symptoms, NTG use, and when to call 911 or MD.  Also discussed weather considerations and indoor options.  Pt voiced understanding. Shonnie is doing well in rehab.  She is now walking the full time on the treadmill at 1.1 mph.  We will continue to monitor her progress.   Expected Outcomes Short: Use RPE daily to regulate intensity. Long: Follow program prescription in THR. Short:  continue to attend consistently Long:  increase overall MET level Short:  continue to attend consistently, ice leg and rest leg until feeling better.  Long:  increase overall MET level Short: add in a day between Wed and  Monday sessions Long: maintain exercise on her own Short: Continue to increase workloads  Long: Continue to improve stamina.   [place] Name 06/14/19 1541 06/28/19 0855 07/13/19 1153 07/27/19 1608 08/07/19 1604     Exercise Goal Re-Evaluation   Exercise Goals Review Increase Physical Activity;Increase Strength and Stamina;Understanding of Exercise Prescription Increase Physical Activity;Increase Strength and Stamina;Understanding of Exercise Prescription Increase Physical Activity;Increase Strength and Stamina;Understanding of Exercise Prescription Increase Physical Activity;Increase Strength and Stamina;Understanding of Exercise Prescription Increase Physical Activity;Increase Strength and Stamina;Understanding of Exercise Prescription   Comments Nashiya has been walking outside on days not at [facility]  She walks about 15 min.  We discussed adding one minute per week until she can do 20-30 min.  We discussed options for continuing to exercise after she finishes LW. Sheryl has been doing well in rehab.  She is now on level 5 for the NuStep.  We will continue to monitor her progress. Tonee ws more short of breath today.  She said she had a big lunch with friends today and ate too much. Victor has been doing well in rehab.  She is on level 5 on the T5 NuStep.  We will continue to monitor her progress. Mahima is doing well in rehab.  She is walking at home on her off days for about 15-20 min.  We talked about trying to stretch time to 30 min at home with walking or using videos.  She has not noticed  much improvement in her strength and stamina.   Expected Outcomes Short:  continue exercise at home Long:  maintain exercise Short: Increase T5 NuStep  Long: Continue to improve stamina. Short: continue to exercise consistently Long : increase overall stamina Short: Increase treadmill Long: Continue to improve stamina. Short: Increase exercise time to 30 min at home.  Long: Conitnue to improve breathing and stamina.   [place]  Name 08/21/19 1508 09/04/19 1421           Exercise Goal Re-Evaluation   Exercise Goals Review Increase Physical Activity;Increase Strength and Stamina;Understanding of Exercise Prescription Increase Physical Activity;Increase Strength and Stamina;Understanding of Exercise Prescription      Comments With our schedule change, Takiyah has needed to switch her class time.  She will be out this week until someone graduates and with holidays, has only attended once since last review.  We will continue to montior her progress. Hetty will be graduating next week.  She improved her post 6MWT by 1106f!!  She is planning to continue to exercise by using her silver sneakers somewhere. She is still not sure where she wants  to go.  She said that personally she hasn't noticed much difference but we talked about what some of her exercise numbers were showing otherwise.      Expected Outcomes Short: Get adjusted to new class time to finish up program.  Long: Continue to improve stamina. Graduate and continue to exercise independently.             Nutrition & Weight - Outcomes:  Pre Biometrics - 05/01/19 1423      Pre Biometrics   Height 5' 6"  (1.676 m)    Weight 236 lb 11.2 oz (107.4 kg)    BMI (Calculated) 38.22    Single Leg Stand 0 seconds           Post Biometrics - 09/04/19 1349       Post  Biometrics   Height 5' 6"  (1.676 m)    Weight 234 lb 9.6 oz (106.4 kg)    BMI (Calculated) 37.88           Nutrition:  Nutrition Therapy & Goals - 08/28/19 1413      Nutrition Therapy   Diet Low Na, HH           Nutrition Discharge:  Nutrition Assessments - 09/06/19 1345      MEDFICTS Scores   Post Score 50           Education Questionnaire Score:  Knowledge Questionnaire Score - 09/06/19 1344      Knowledge Questionnaire Score   Post Score 16/18           Goals reviewed with patient; copy given to patient.

## 2019-09-13 ENCOUNTER — Other Ambulatory Visit: Payer: Self-pay

## 2019-09-13 ENCOUNTER — Encounter: Payer: Medicare HMO | Admitting: *Deleted

## 2019-09-13 DIAGNOSIS — J449 Chronic obstructive pulmonary disease, unspecified: Secondary | ICD-10-CM

## 2019-09-13 NOTE — Progress Notes (Signed)
Pulmonary Individual Treatment Plan  Patient Details  Name: Lori Browning MRN: [id number] Date of Birth: 1930-10-29 Referring Provider:     Pulmonary Rehab from 05/01/2019 in [facility] Cardiac and Pulmonary Rehab  Referring Provider Aleskerov      Initial Encounter Date:    Pulmonary Rehab from 05/01/2019 in [facility] Cardiac and Pulmonary Rehab  Date 05/01/19      Visit Diagnosis: Chronic obstructive pulmonary disease, unspecified COPD type ([place])  Patient's Home Medications on Admission:  Current Outpatient Medications:  .  albuterol (PROVENTIL) (2.5 MG/3ML) 0.083% nebulizer solution, Take 2.5 mg by nebulization every 6 (six) hours as needed for wheezing or shortness of breath., Disp: , Rfl:  .  carvedilol (COREG) 25 MG tablet, Take 25 mg by mouth 2 (two) times daily with a meal., Disp: , Rfl:  .  Cranberry 500 MG CAPS, Take 1 tablet by mouth daily. , Disp: , Rfl:  .  furosemide (LASIX) 40 MG tablet, Take 1 tablet (40 mg total) by mouth daily., Disp: 30 tablet, Rfl: 0 .  pravastatin (PRAVACHOL) 40 MG tablet, Take 40 mg by mouth daily., Disp: , Rfl:  .  spironolactone (ALDACTONE) 25 MG tablet, Take 1 tablet (25 mg total) by mouth daily., Disp: 30 tablet, Rfl: 0  Past Medical History: Past Medical History:  Diagnosis Date  . CHF (congestive heart failure) ([place])   . COPD (chronic obstructive pulmonary disease) ([place])   . Hyperlipemia   . Hypertension   . Urge incontinence     Tobacco Use: Social History   Tobacco Use  Smoking Status Never Smoker  Smokeless Tobacco Never Used    Labs: Recent Review Flowsheet Data   There is no flowsheet data to display.      Pulmonary Assessment Scores:  Pulmonary Assessment Scores    Row Name 05/01/19 1426 09/06/19 1342       ADL UCSD   SOB Score total 30 48    Rest 1 0    Walk 3 2    Stairs 4 4    Bath 2 1    Dress 2 2    Shop 3 2      CAT Score   CAT Score 25 14      mMRC Score   mMRC Score 3 --            UCSD: Self-administered rating of dyspnea associated with activities of daily living (ADLs) 6-point scale (0 = "not at all" to 5 = "maximal or unable to do because of breathlessness")  Scoring Scores range from 0 to 120.  Minimally important difference is 5 units  CAT: CAT can identify the health impairment of COPD patients and is better correlated with disease progression.  CAT has a scoring range of zero to 40. The CAT score is classified into four groups of low (less than 10), medium (10 - 20), high (21-30) and very high (31-40) based on the impact level of disease on health status. A CAT score over 10 suggests significant symptoms.  A worsening CAT score could be explained by an exacerbation, poor medication adherence, poor inhaler technique, or progression of COPD or comorbid conditions.  CAT MCID is 2 points  mMRC: mMRC (Modified Medical Research Council) Dyspnea Scale is used to assess the degree of baseline functional disability in patients of respiratory disease due to dyspnea. No minimal important difference is established. A decrease in score of 1 point or greater is considered a positive change.   Pulmonary Function Assessment:  Pulmonary Function Assessment - 04/27/19 1109      Breath   Shortness of Breath No;Limiting activity           Exercise Target Goals: Exercise Program Goal: Individual exercise prescription set using results from initial 6 min walk test and THRR while considering  patient's activity barriers and safety.   Exercise Prescription Goal: Initial exercise prescription builds to 30-45 minutes a day of aerobic activity, 2-3 days per week.  Home exercise guidelines will be given to patient during program as part of exercise prescription that the participant will acknowledge.  Education: Aerobic Exercise & Resistance Training: - Gives group verbal and written instruction on the various components of exercise. Focuses on aerobic and resistive training  programs and the benefits of this training and how to safely progress through these programs..   Education: Exercise & Equipment Safety: - Individual verbal instruction and demonstration of equipment use and safety with use of the equipment.   Pulmonary Rehab from 09/13/2019 in [facility] Cardiac and Pulmonary Rehab  Date 05/01/19  Educator AS  Instruction Review Code 1- Verbalizes Understanding      Education: Exercise Physiology & General Exercise Guidelines: - Group verbal and written instruction with models to review the exercise physiology of the cardiovascular system and associated critical values. Provides general exercise guidelines with specific guidelines to those with heart or lung disease.    Education: Flexibility, Balance, Mind/Body Relaxation: Provides group verbal/written instruction on the benefits of flexibility and balance training, including mind/body exercise modes such as yoga, pilates and tai chi.  Demonstration and skill practice provided.   Activity Barriers & Risk Stratification:   6 Minute Walk:  6 Minute Walk    Row Name 05/01/19 1409 09/04/19 1347       6 Minute Walk   Phase Initial Discharge    Distance 600 feet 715 feet    Distance % Change -- 19.7 %    Distance Feet Change -- 115 ft    Walk Time 5 minutes 6 minutes    # of Rest Breaks 2 0    MPH 1.36 1.35    METS 1.77 0.42    RPE 15 15    Perceived Dyspnea  3 3    VO2 Peak 1.25 1.44    Symptoms Yes (comment) Yes (comment)    Comments shortness of breath back pain 4/10, SOB    Resting HR 65 bpm 60 bpm    Resting BP 124/74 124/60    Resting Oxygen Saturation  96 % 94 %    Exercise Oxygen Saturation  during 6 min walk 90 % 84 %    Max Ex. HR 96 bpm 97 bpm    Max Ex. BP 164/76 136/64    2 Minute Post BP 152/78 134/70      Interval HR   1 Minute HR 83 --    2 Minute HR 91 --    3 Minute HR 94 --    4 Minute HR 80  resting --    5 Minute HR 93 --    6 Minute HR 96 --    2 Minute Post HR 70 --     Interval Heart Rate? Yes --      Interval Oxygen   Interval Oxygen? Yes --    1 Minute Oxygen Saturation % 92 % --    1 Minute Liters of Oxygen 0 L --    2 Minute Oxygen Saturation % 90 % --  2 Minute Liters of Oxygen 0 L --    3 Minute Oxygen Saturation % 90 % --    3 Minute Liters of Oxygen 0 L --    4 Minute Oxygen Saturation % 90 % --    4 Minute Liters of Oxygen 0 L --    5 Minute Oxygen Saturation % 92 % --    5 Minute Liters of Oxygen 0 L --    6 Minute Oxygen Saturation % 90 % --    6 Minute Liters of Oxygen 0 L --    2 Minute Post Oxygen Saturation % 96 % --    2 Minute Post Liters of Oxygen 0 L --          Oxygen Initial Assessment:  Oxygen Initial Assessment - 04/27/19 1109      Home Oxygen   Home Oxygen Device None    Sleep Oxygen Prescription None    Home Exercise Oxygen Prescription None    Home at Rest Exercise Oxygen Prescription None      Initial 6 min Walk   Oxygen Used None      Program Oxygen Prescription   Program Oxygen Prescription None      Intervention   Short Term Goals To learn and understand importance of monitoring SPO2 with pulse oximeter and demonstrate accurate use of the pulse oximeter.;To learn and understand importance of maintaining oxygen saturations>88%;To learn and demonstrate proper pursed lip breathing techniques or other breathing techniques.;To learn and demonstrate proper use of respiratory medications    Long  Term Goals Verbalizes importance of monitoring SPO2 with pulse oximeter and return demonstration;Maintenance of O2 saturations>88%;Exhibits proper breathing techniques, such as pursed lip breathing or other method taught during program session;Compliance with respiratory medication;Demonstrates proper use of MDI's           Oxygen Re-Evaluation:  Oxygen Re-Evaluation    Row Name 05/03/19 1643 06/14/19 1547 08/07/19 1615 09/04/19 1426       Program Oxygen Prescription   Program Oxygen Prescription None None  None None      Home Oxygen   Home Oxygen Device None None None None    Sleep Oxygen Prescription None None None None    Home Exercise Oxygen Prescription None None None None    Home at Rest Exercise Oxygen Prescription None None None None    Compliance with Home Oxygen Use Yes Yes Yes Yes      Goals/Expected Outcomes   Short Term Goals To learn and understand importance of maintaining oxygen saturations>88%;To learn and understand importance of monitoring SPO2 with pulse oximeter and demonstrate accurate use of the pulse oximeter.;To learn and demonstrate proper pursed lip breathing techniques or other breathing techniques. To learn and understand importance of maintaining oxygen saturations>88%;To learn and understand importance of monitoring SPO2 with pulse oximeter and demonstrate accurate use of the pulse oximeter.;To learn and demonstrate proper pursed lip breathing techniques or other breathing techniques. To learn and understand importance of maintaining oxygen saturations>88%;To learn and understand importance of monitoring SPO2 with pulse oximeter and demonstrate accurate use of the pulse oximeter.;To learn and demonstrate proper pursed lip breathing techniques or other breathing techniques.;To learn and demonstrate proper use of respiratory medications To learn and understand importance of maintaining oxygen saturations>88%;To learn and understand importance of monitoring SPO2 with pulse oximeter and demonstrate accurate use of the pulse oximeter.;To learn and demonstrate proper pursed lip breathing techniques or other breathing techniques.;To learn and demonstrate proper use of respiratory medications  Long  Term Goals Verbalizes importance of monitoring SPO2 with pulse oximeter and return demonstration;Maintenance of O2 saturations>88%;Exhibits proper breathing techniques, such as pursed lip breathing or other method taught during program session Verbalizes importance of monitoring SPO2  with pulse oximeter and return demonstration;Maintenance of O2 saturations>88%;Exhibits proper breathing techniques, such as pursed lip breathing or other method taught during program session Verbalizes importance of monitoring SPO2 with pulse oximeter and return demonstration;Maintenance of O2 saturations>88%;Exhibits proper breathing techniques, such as pursed lip breathing or other method taught during program session;Compliance with respiratory medication Verbalizes importance of monitoring SPO2 with pulse oximeter and return demonstration;Maintenance of O2 saturations>88%;Exhibits proper breathing techniques, such as pursed lip breathing or other method taught during program session;Compliance with respiratory medication    Comments Reviewed PLB technique with pt.  Talked about how it works and it's importance in maintaining their exercise saturations.  MAintianing Exercise saturations above 88% wether on oxygen or not Reviewed PLB technique with pt.  Talked about how it works and it's importance in maintaining their exercise saturations.  MAintianing Exercise saturations above 88% wether on oxygen or not Lori Browning is doing well with her PLB.  She watches her sats at home with a pulse oximeter.  She said it usually stays in the upper 80s.  She does use her nebulizer daily. Lori Browning will be graduating next week.  She has done well with her PLB and continues to use it, but she does not feel that her breathing has gotten any better.  We talked about how pulmonary disease is not reverisble but the goal is to maintain and not worsen.    Goals/Expected Outcomes Short: Become more profiecient at using PLB and being able to monitor pulse oximetry readings  Long: Become independent at using PLB. Short: Become more profiecient at using PLB and being able to monitor pulse oximetry readings  Long: Become independent at using PLB. Short: Conitnue to use PLB for breathing  Long; Continued compliance with nebulizer. Continue to  use PLB regularly and compliance with nebulizer.           Oxygen Discharge (Final Oxygen Re-Evaluation):  Oxygen Re-Evaluation - 09/04/19 1426      Program Oxygen Prescription   Program Oxygen Prescription None      Home Oxygen   Home Oxygen Device None    Sleep Oxygen Prescription None    Home Exercise Oxygen Prescription None    Home at Rest Exercise Oxygen Prescription None    Compliance with Home Oxygen Use Yes      Goals/Expected Outcomes   Short Term Goals To learn and understand importance of maintaining oxygen saturations>88%;To learn and understand importance of monitoring SPO2 with pulse oximeter and demonstrate accurate use of the pulse oximeter.;To learn and demonstrate proper pursed lip breathing techniques or other breathing techniques.;To learn and demonstrate proper use of respiratory medications    Long  Term Goals Verbalizes importance of monitoring SPO2 with pulse oximeter and return demonstration;Maintenance of O2 saturations>88%;Exhibits proper breathing techniques, such as pursed lip breathing or other method taught during program session;Compliance with respiratory medication    Comments Lori Browning will be graduating next week.  She has done well with her PLB and continues to use it, but she does not feel that her breathing has gotten any better.  We talked about how pulmonary disease is not reverisble but the goal is to maintain and not worsen.    Goals/Expected Outcomes Continue to use PLB regularly and compliance with nebulizer.  Initial Exercise Prescription:  Initial Exercise Prescription - 05/01/19 1400      Date of Initial Exercise RX and Referring Provider   Date 05/01/19    Referring Provider Aleskerov      Treadmill   MPH 0.8    Grade 0    Minutes 15    METs 1      NuStep   Level 1    SPM 80    Minutes 15    METs 1      REL-XR   Level 1    Speed 50    Minutes 15    METs 1      T5 Nustep   Level 1    SPM 80    Minutes 15       Prescription Details   Frequency (times per week) 3    Duration Progress to 30 minutes of continuous aerobic without signs/symptoms of physical distress      Intensity   THRR 40-80% of Max Heartrate 93-120    Ratings of Perceived Exertion 11-13    Perceived Dyspnea 0-4      Resistance Training   Training Prescription Yes    Weight 3 lb    Reps 10-15           Perform Capillary Blood Glucose checks as needed.  Exercise Prescription Changes:  Exercise Prescription Changes    Row Name 05/01/19 1400 05/08/19 1400 05/24/19 1100 05/29/19 1700 06/06/19 1400     Response to Exercise   Blood Pressure (Admit) 124/74 138/64 112/64 -- 144/84   Blood Pressure (Exercise) 164/76 170/74 142/62 -- 178/80   Blood Pressure (Exit) 152/78 130/68 120/62 -- 122/68   Heart Rate (Admit) 65 bpm 88 bpm 69 bpm -- 61 bpm   Heart Rate (Exercise) 96 bpm 76 bpm 78 bpm -- 94 bpm   Heart Rate (Exit) 70 bpm 62 bpm 69 bpm -- 69 bpm   Oxygen Saturation (Admit) 96 % 88 % 94 % -- 92 %   Oxygen Saturation (Exercise) 90 % 92 % 91 % -- 91 %   Oxygen Saturation (Exit) 96 % 92 % 93 % -- 87 %   Rating of Perceived Exertion (Exercise) _0 -- 15   Perceived Dyspnea (Exercise) 3 -- 2 -- 2   Symptoms shortness of breath SOB -- -- SOB   Comments -- first full day of exercise -- -- --   Duration -- Progress to 30 minutes of  aerobic without signs/symptoms of physical distress Progress to 30 minutes of  aerobic without signs/symptoms of physical distress -- Continue with 30 min of aerobic exercise without signs/symptoms of physical distress.   Intensity -- THRR unchanged THRR unchanged -- THRR unchanged     Progression   Progression -- Continue to progress workloads to maintain intensity without signs/symptoms of physical distress. Continue to progress workloads to maintain intensity without signs/symptoms of physical distress. -- Continue to progress workloads to maintain intensity without signs/symptoms of  physical distress.   Average METs -- 1.65 1.6 -- 2.1     Resistance Training   Training Prescription -- Yes Yes -- Yes   Weight -- 3 lb 3 lb -- 3 lb   Reps -- 10-15 10-15 -- 10-15     Interval Training   Interval Training -- No No -- No     Treadmill   MPH -- 0.8 0.8 -- 1.1   Grade -- 0 0 -- 0   Minutes -- 15 15 --  15   METs -- 1.6 1.6 -- 1.6     NuStep   Level -- -- -- -- 5   Minutes -- -- -- -- 15   METs -- -- -- -- 2.6     T5 Nustep   Level -- 1 1 -- --   SPM -- -- 80 -- --   Minutes -- 15 15 -- --   METs -- 1.7 -- -- --     Home Exercise Plan   Plans to continue exercise at -- -- -- Home (comment)  walk and stationary bike Home (comment)  walk and stationary bike   Frequency -- -- -- Add 2 additional days to program exercise sessions. Add 2 additional days to program exercise sessions.   Initial Home Exercises Provided -- -- -- 05/29/19 05/29/19   Row Name 06/15/19 1400 06/28/19 0800 07/13/19 1100 07/27/19 1600 08/09/19 1500     Response to Exercise   Blood Pressure (Admit) 138/66 130/82 138/60 130/78 122/64   Blood Pressure (Exercise) 146/70 148/70 154/78 166/70 144/66   Blood Pressure (Exit) 116/62 128/70 94/52 130/64 122/64   Heart Rate (Admit) 77 bpm 70 bpm 75 bpm 68 bpm 64 bpm   Heart Rate (Exercise) 77 bpm 80 bpm 90 bpm 85 bpm 89 bpm   Heart Rate (Exit) 65 bpm 71 bpm 78 bpm 70 bpm 66 bpm   Oxygen Saturation (Admit) 90 % 90 % 92 % 90 % 91 %   Oxygen Saturation (Exercise) 88 % 90 % 90 % 89 % 92 %   Oxygen Saturation (Exit) 92 % 93 % 93 % 92 % 92 %   Rating of Perceived Exertion (Exercise) _0 Perceived Dyspnea (Exercise) _1 Symptoms -- none SOB SOB none   Duration Continue with 30 min of aerobic exercise without signs/symptoms of physical distress. Continue with 30 min of aerobic exercise without signs/symptoms of physical distress. Continue with 30 min of aerobic exercise without signs/symptoms of physical distress. Continue with 30 min of  aerobic exercise without signs/symptoms of physical distress. Continue with 30 min of aerobic exercise without signs/symptoms of physical distress.   Intensity _2      Progression   Progression Continue to progress workloads to maintain intensity without signs/symptoms of physical distress. Continue to progress workloads to maintain intensity without signs/symptoms of physical distress. Continue to progress workloads to maintain intensity without signs/symptoms of physical distress. Continue to progress workloads to maintain intensity without signs/symptoms of physical distress. Continue to progress workloads to maintain intensity without signs/symptoms of physical distress.   Average METs 2 2.21 2.2 2.08 2.5     Resistance Training   Training Prescription _3    Weight 3 lb 3 lb 3 lb 3 lb 3 lb   Reps 10-15 10-15 -- 10-15 10-15     Interval Training   Interval Training _4      Treadmill   MPH -- 1.2 1.6 1.5 --   Grade -- 0 0 0 --   Minutes -- _5 --   METs -- 1.92 2.15 2.15 --     NuStep   Level 5 5 -- 3 5   SPM 80 -- -- -- --   Minutes 15 15 -- 15 15   METs 2.5 2.9 -- -- 2.5     T5 Nustep   Level --  _0 --   SPM -- -- 80 -- --   Minutes -- _1 --   METs -- 1.8 -- 2 --     Home Exercise Plan   Plans to continue exercise at Home (comment)  walk and stationary bike Home (comment)  walk and stationary bike Home (comment)  walk and stationary bike Home (comment)  walk and stationary bike Home (comment)  walk and stationary bike   Frequency Add 2 additional days to program exercise sessions. Add 2 additional days to program exercise sessions. Add 2 additional days to program exercise sessions. Add 2 additional days to program exercise sessions. Add 2 additional days to program exercise sessions.   Initial Home Exercises Provided 05/29/19 05/29/19 05/29/19 05/29/19 05/29/19    Row Name 08/21/19 1500 09/06/19 1400           Response to Exercise   Blood Pressure (Admit) 142/64 124/60      Blood Pressure (Exercise) 150/72 136/64      Blood Pressure (Exit) 134/70 132/70      Heart Rate (Admit) 76 bpm 66 bpm      Heart Rate (Exercise) 86 bpm 97 bpm      Heart Rate (Exit) 72 bpm 71 bpm      Oxygen Saturation (Admit) 94 % 94 %      Oxygen Saturation (Exercise) 89 % 84 %      Oxygen Saturation (Exit) 93 % 93 %      Rating of Perceived Exertion (Exercise) 13 15      Perceived Dyspnea (Exercise) 2 3      Symptoms none none      Duration Continue with 30 min of aerobic exercise without signs/symptoms of physical distress. Continue with 30 min of aerobic exercise without signs/symptoms of physical distress.      Intensity THRR unchanged THRR unchanged        Progression   Progression Continue to progress workloads to maintain intensity without signs/symptoms of physical distress. Continue to progress workloads to maintain intensity without signs/symptoms of physical distress.      Average METs 2.1 2.5        Resistance Training   Training Prescription Yes Yes      Weight 3 lb 3 lb      Reps 10-15 10-15        Interval Training   Interval Training No No        Treadmill   MPH 1.5 --      Grade 0 --      Minutes 15 --      METs 2.15 --        NuStep   Level -- 5      SPM -- 80      Minutes -- 15      METs -- 2        T5 Nustep   Level 5 --      Minutes 15 --      METs 2.1 --        Home Exercise Plan   Plans to continue exercise at Home (comment)  walk and stationary bike --      Frequency Add 2 additional days to program exercise sessions. --      Initial Home Exercises Provided 05/29/19 --             Exercise Comments:  Exercise Comments    Row Name 05/03/19 1642  Exercise Comments First full day of exercise!  Patient was oriented to gym and equipment including functions, settings, policies, and procedures.  Patient's  individual exercise prescription and treatment plan were reviewed.  All starting workloads were established based on the results of the 6 minute walk test done at initial orientation visit.  The plan for exercise progression was also introduced and progression will be customized based on patient's performance and goals.              Exercise Goals and Review:  Exercise Goals    Row Name 05/01/19 1422             Exercise Goals   Increase Physical Activity Yes       Intervention Provide advice, education, support and counseling about physical activity/exercise needs.;Develop an individualized exercise prescription for aerobic and resistive training based on initial evaluation findings, risk stratification, comorbidities and participant's personal goals.       Expected Outcomes Short Term: Attend rehab on a regular basis to increase amount of physical activity.;Long Term: Add in home exercise to make exercise part of routine and to increase amount of physical activity.;Long Term: Exercising regularly at least 3-5 days a week.       Increase Strength and Stamina Yes       Intervention Provide advice, education, support and counseling about physical activity/exercise needs.       Expected Outcomes Short Term: Increase workloads from initial exercise prescription for resistance, speed, and METs.;Short Term: Perform resistance training exercises routinely during rehab and add in resistance training at home;Long Term: Improve cardiorespiratory fitness, muscular endurance and strength as measured by increased METs and functional capacity (6MWT)       Able to understand and use rate of perceived exertion (RPE) scale Yes       Intervention Provide education and explanation on how to use RPE scale       Expected Outcomes Short Term: Able to use RPE daily in rehab to express subjective intensity level;Long Term:  Able to use RPE to guide intensity level when exercising independently       Able to  understand and use Dyspnea scale Yes       Intervention Provide education and explanation on how to use Dyspnea scale       Expected Outcomes Short Term: Able to use Dyspnea scale daily in rehab to express subjective sense of shortness of breath during exertion;Long Term: Able to use Dyspnea scale to guide intensity level when exercising independently       Knowledge and understanding of Target Heart Rate Range (THRR) Yes       Intervention Provide education and explanation of THRR including how the numbers were predicted and where they are located for reference       Expected Outcomes Short Term: Able to use daily as guideline for intensity in rehab;Long Term: Able to use THRR to govern intensity when exercising independently;Short Term: Able to state/look up THRR       Able to check pulse independently Yes       Intervention Provide education and demonstration on how to check pulse in carotid and radial arteries.;Review the importance of being able to check your own pulse for safety during independent exercise       Expected Outcomes Short Term: Able to explain why pulse checking is important during independent exercise;Long Term: Able to check pulse independently and accurately       Understanding of Exercise Prescription Yes  Intervention Provide education, explanation, and written materials on patient's individual exercise prescription       Expected Outcomes Short Term: Able to explain program exercise prescription;Long Term: Able to explain home exercise prescription to exercise independently              Exercise Goals Re-Evaluation :  Exercise Goals Re-Evaluation    [place] Name 05/03/19 1642 05/24/19 1112 05/24/19 1545 05/29/19 1741 06/06/19 1417     Exercise Goal Re-Evaluation   Exercise Goals Review Able to understand and use rate of perceived exertion (RPE) scale;Able to understand and use Dyspnea scale;Knowledge and understanding of Target Heart Rate Range (THRR);Understanding of  Exercise Prescription Increase Physical Activity;Increase Strength and Stamina;Able to understand and use rate of perceived exertion (RPE) scale;Able to understand and use Dyspnea scale;Knowledge and understanding of Target Heart Rate Range (THRR);Able to check pulse independently;Understanding of Exercise Prescription Increase Physical Activity;Increase Strength and Stamina;Able to understand and use rate of perceived exertion (RPE) scale;Able to understand and use Dyspnea scale;Knowledge and understanding of Target Heart Rate Range (THRR);Able to check pulse independently;Understanding of Exercise Prescription Increase Physical Activity;Increase Strength and Stamina;Able to understand and use rate of perceived exertion (RPE) scale;Able to understand and use Dyspnea scale;Knowledge and understanding of Target Heart Rate Range (THRR);Able to check pulse independently;Understanding of Exercise Prescription Increase Physical Activity;Increase Strength and Stamina;Understanding of Exercise Prescription   Comments Reviewed RPE scale, THR and program prescription with pt today.  Pt voiced understanding and was given a copy of goals to take home. Lori Browning has been attending consistently and tolerates exercise well.  Staff willl monitor progress. Lori Browning has ben having knee pain starting last Friday 05/19/19; she has been using ice and it has been feeling better and the swelling has gone down. Lori Browning talked to pt regarding ice and heat as well as exercise. Reviewed home exercise with pt today.  Pt plans to walk and use stationary bike for exercise.  Reviewed THR, pulse, RPE, sign and symptoms, NTG use, and when to call 911 or MD.  Also discussed weather considerations and indoor options.  Pt voiced understanding. Teirra is doing well in rehab.  She is now walking the full time on the treadmill at 1.1 mph.  We will continue to monitor her progress.   Expected Outcomes Short: Use RPE daily to regulate intensity. Long: Follow  program prescription in THR. Short:  continue to attend consistently Long:  increase overall MET level Short:  continue to attend consistently, ice leg and rest leg until feeling better.  Long:  increase overall MET level Short: add in a day between Wed and Monday sessions Long: maintain exercise on her own Short: Continue to increase workloads  Long: Continue to improve stamina.   [place] Name 06/14/19 1541 06/28/19 0855 07/13/19 1153 07/27/19 1608 08/07/19 1604     Exercise Goal Re-Evaluation   Exercise Goals Review Increase Physical Activity;Increase Strength and Stamina;Understanding of Exercise Prescription Increase Physical Activity;Increase Strength and Stamina;Understanding of Exercise Prescription Increase Physical Activity;Increase Strength and Stamina;Understanding of Exercise Prescription Increase Physical Activity;Increase Strength and Stamina;Understanding of Exercise Prescription Increase Physical Activity;Increase Strength and Stamina;Understanding of Exercise Prescription   Comments Lori Browning has been walking outside on days not at [facility].  She walks about 15 min.  We discussed adding one minute per week until she can do 20-30 min.  We discussed options for continuing to exercise after she finishes LW. Lori Browning has been doing well in rehab.  She is now on level 5 for the NuStep.  We will continue to monitor her progress. Lori Browning ws more short of breath today.  She said she had a big lunch with friends today and ate too much. Lori Browning has been doing well in rehab.  She is on level 5 on the T5 NuStep.  We will continue to monitor her progress. Lori Browning is doing well in rehab.  She is walking at home on her off days for about 15-20 min.  We talked about trying to stretch time to 30 min at home with walking or using videos.  She has not noticed  much improvement in her strength and stamina.   Expected Outcomes Short:  continue exercise at home Long:  maintain exercise Short: Increase T5 NuStep  Long: Continue to  improve stamina. Short: continue to exercise consistently Long : increase overall stamina Short: Increase treadmill Long: Continue to improve stamina. Short: Increase exercise time to 30 min at home.  Long: Conitnue to improve breathing and stamina.   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  She will be out this week until someone graduates and with holidays, has only attended once since last review.  We will continue to montior her progress. Lori Browning will be graduating next week.  She improved her post 6MWT by 166f!!  She is planning to continue to exercise by using her silver sneakers somewhere. She is still not sure where she wants to go.  She said that personally she hasn't noticed much difference but we talked about what some of her exercise numbers were showing otherwise.      Expected Outcomes Short: Get adjusted to new class time to finish up program.  Long: Continue to improve stamina. Graduate and continue to exercise independently.             Discharge Exercise Prescription (Final Exercise Prescription Changes):  Exercise Prescription Changes - 09/06/19 1400      Response to Exercise   Blood Pressure (Admit) 124/60    Blood Pressure (Exercise) 136/64    Blood Pressure (Exit) 132/70    Heart Rate (Admit) 66 bpm    Heart Rate (Exercise) 97 bpm    Heart Rate (Exit) 71 bpm    Oxygen Saturation (Admit) 94 %    Oxygen Saturation (Exercise) 84 %    Oxygen Saturation (Exit) 93 %    Rating of Perceived Exertion (Exercise) 15    Perceived Dyspnea (Exercise) 3    Symptoms none    Duration Continue with 30 min of aerobic exercise without signs/symptoms of  physical distress.    Intensity THRR unchanged      Progression   Progression Continue to progress workloads to maintain intensity without signs/symptoms of physical distress.    Average METs 2.5      Resistance Training   Training Prescription Yes    Weight 3 lb    Reps 10-15      Interval Training   Interval Training No      NuStep   Level 5    SPM 80    Minutes 15    METs 2           Nutrition:  Target Goals: Understanding of nutrition guidelines, daily intake of  sodium <1594m, cholesterol <2011m calories 30% from fat and 7% or less from saturated fats, daily to have 5 or more servings of fruits and vegetables.  Education: Controlling Sodium/Reading Food Labels -Group verbal and written material supporting the discussion of sodium use in heart healthy nutrition. Review and explanation with models, verbal and written materials for utilization of the food label.   Education: General Nutrition Guidelines/Fats and Fiber: -Group instruction provided by verbal, written material, models and posters to present the general guidelines for heart healthy nutrition. Gives an explanation and review of dietary fats and fiber.   Biometrics:  Pre Biometrics - 05/01/19 1423      Pre Biometrics   Height _0  (1.676 m)    Weight 236 lb 11.2 oz (107.4 kg)    BMI (Calculated) 38.22    Single Leg Stand 0 seconds           Post Biometrics - 09/04/19 1349       Post  Biometrics   Height _1  (1.676 m)    Weight 234 lb 9.6 oz (106.4 kg)    BMI (Calculated) 37.88           Nutrition Therapy Plan and Nutrition Goals:  Nutrition Therapy & Goals - 08/28/19 1413      Nutrition Therapy   Diet Low Na, HH           Nutrition Assessments:  Nutrition Assessments - 09/06/19 1345      MEDFICTS Scores   Post Score 50           MEDIFICTS Score Key:          ?70 Need to make dietary changes          40-70 Heart Healthy Diet         ? 40 Therapeutic Level Cholesterol  Diet  Nutrition Goals Re-Evaluation:  Nutrition Goals Re-Evaluation    [place] 06/26/19 1600 08/07/19 1610 08/28/19 1451         Goals   Nutrition Goal ST: modify take out foods LT: Improve SOB, lose weight ST: modify take out foods LT: Improve SOB, lose weight ST: continue with current changes LT: Improve SOB, lose weight     Comment Review take-out menus [place] really been watching what she eats more closely. She is really trying to cut back and make healthier options what she does eat out.  She is really cut back on her salt intake. Pt reports utilizing the modified menus to help her choose more heart healthy options when eating out.     Expected Outcome ST: modify take out foods LT: Improve SOB, lose weight Short: Continue to work on eating out Long: Continue to work on weight loss. Short: Continue to work on eating out Long: Continue to work on weight loss.            Nutrition Goals Discharge (Final Nutrition Goals Re-Evaluation):  Nutrition Goals Re-Evaluation - 08/28/19 1451      Goals   Nutrition Goal ST: continue with current changes LT: Improve SOB, lose weight    Comment Pt reports utilizing the modified menus to help her choose more heart healthy options when eating out.    Expected Outcome Short: Continue to work on eating out Long: Continue to work on weight loss.           Psychosocial: Target Goals: Acknowledge presence or absence of significant depression and/or stress, maximize coping skills, provide positive support system. Participant is able  to verbalize types and ability to use techniques and skills needed for reducing stress and depression.   Education: Depression - Provides group verbal and written instruction on the correlation between heart/lung disease and depressed mood, treatment options, and the stigmas associated with seeking treatment.   Pulmonary Rehab from 09/13/2019 in [facility] Cardiac and Pulmonary Rehab  Date 09/13/19  Educator SB  Instruction  Review Code 1- [employer] Understanding      Education: Sleep Hygiene -Provides group verbal and written instruction about how sleep can affect your health.  Define sleep hygiene, discuss sleep cycles and impact of sleep habits. Review good sleep hygiene tips.    Education: Stress and Anxiety: - Provides group verbal and written instruction about the health risks of elevated stress and causes of high stress.  Discuss the correlation between heart/lung disease and anxiety and treatment options. Review healthy ways to manage with stress and anxiety.   Pulmonary Rehab from 09/13/2019 in [facility] Cardiac and Pulmonary Rehab  Date 09/13/19  Educator SB  Instruction Review Code 1- Verbalizes Understanding      Initial Review & Psychosocial Screening:  Initial Psych Review & Screening - 04/27/19 1110      Initial Review   Current issues with None Identified      Family Dynamics   Good Support System? Yes    Comments She can look to her nephew and brother for support.      Barriers   Psychosocial barriers to participate in program The patient should benefit from training in stress management and relaxation.;There are no identifiable barriers or psychosocial needs.      Screening Interventions   Interventions Encouraged to exercise;Provide feedback about the scores to participant;To provide support and resources with identified psychosocial needs    Expected Outcomes Short Term goal: Utilizing psychosocial counselor, staff and physician to assist with identification of specific Stressors or current issues interfering with healing process. Setting desired goal for each stressor or current issue identified.;Long Term Goal: Stressors or current issues are controlled or eliminated.;Short Term goal: Identification and review with participant of any Quality of Life or Depression concerns found by scoring the questionnaire.;Long Term goal: The participant improves quality of Life and PHQ9 Scores as seen by  post scores and/or verbalization of changes           Quality of Life Scores:  Scores of 19 and below usually indicate a poorer quality of life in these areas.  A difference of  2-3 points is a clinically meaningful difference.  A difference of 2-3 points in the total score of the Quality of Life Index has been associated with significant improvement in overall quality of life, self-image, physical symptoms, and general health in studies assessing change in quality of life.  PHQ-9: Recent Review Flowsheet Data    Depression screen [facility] 2/9 09/06/2019 05/01/2019   Decreased Interest 1 0   Down, Depressed, Hopeless 0 0   PHQ - 2 Score 1 0   Altered sleeping 1 0   Tired, decreased energy 2 1   Change in appetite 1 0   Feeling bad or failure about yourself  0 0   Trouble concentrating 0 0   Moving slowly or fidgety/restless 0 0   Suicidal thoughts 0 -   PHQ-9 Score 5 1   Difficult doing work/chores Not difficult at all Not difficult at all     Interpretation of Total Score  Total Score Depression Severity:  1-4 = Minimal depression, 5-9 = Mild depression,  10-14 = Moderate depression, 15-19 = Moderately severe depression, 20-27 = Severe depression   Psychosocial Evaluation and Intervention:  Psychosocial Evaluation - 09/04/19 1423      Discharge [place] will be graduating next week.  She has enjoyed the program, but personally has not noticed much difference in how she is feeling.  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  Mentally she is feeling good.           Psychosocial Re-Evaluation:  Psychosocial Re-Evaluation    [place] Name 05/24/19 1601 06/14/19 1539 08/07/19 1606         Psychosocial Re-Evaluation   Current issues with None Identified -- None Identified     Comments Pt reports no problems. Lori Browning reports no stress cooncerns  and she sleeps well most of the time. Lori Browning is doing well in rehab.   She has no major stressors and sleeps well.     Expected Outcomes Continue to manage stress levels. Short: conintue to exercise Long: maintain positive outlook Short: conintue to exercise Long: maintain positive outlook     Interventions Encouraged to attend Pulmonary Rehabilitation for the exercise -- Encouraged to attend Pulmonary Rehabilitation for the exercise     Continue Psychosocial Services  Follow up required by staff -- Follow up required by staff            Psychosocial Discharge (Final Psychosocial Re-Evaluation):  Psychosocial Re-Evaluation - 08/07/19 1606      Psychosocial Re-Evaluation   Current issues with None Identified    Comments Lori Browning is doing well in rehab.   She has no major stressors and sleeps well.    Expected Outcomes Short: conintue to exercise Long: maintain positive outlook    Interventions Encouraged to attend Pulmonary Rehabilitation for the exercise    Continue Psychosocial Services  Follow up required by staff           Education: Education Goals: Education classes will be provided on a weekly basis, covering required topics. Participant will state understanding/return demonstration of topics presented.  Learning Barriers/Preferences:  Learning Barriers/Preferences - 04/27/19 1111      Learning Barriers/Preferences   Learning Barriers None    Learning Preferences None           General Pulmonary Education Topics:  Infection Prevention: - Provides verbal and written material to individual with discussion of infection control including proper hand washing and proper equipment cleaning during exercise session.   Pulmonary Rehab from 09/13/2019 in [facility] Cardiac and Pulmonary Rehab  Date 05/01/19  Educator AS  Instruction Review Code 1- Verbalizes Understanding      Falls Prevention: - Provides verbal and written material to individual with discussion of falls prevention  and safety.   Pulmonary Rehab from 09/13/2019 in [facility] Cardiac and Pulmonary Rehab  Date 05/01/19  Educator AS  Instruction Review Code 1- Verbalizes Understanding      Chronic Lung Diseases: - Group verbal and written instruction to review updates, respiratory medications, advancements in procedures and treatments. Discuss use of supplemental oxygen including available portable oxygen systems, continuous and intermittent flow rates, concentrators, personal use and safety guidelines. Review proper use of inhaler and spacers. Provide informative websites for self-education.    Pulmonary Rehab from 09/13/2019 in [facility] Cardiac and Pulmonary Rehab  Date 08/30/19  Educator [facility]  Instruction Review Code 1- Verbalizes Understanding  Energy Conservation: - Provide group verbal and written instruction for methods to conserve energy, plan and organize activities. Instruct on pacing techniques, use of adaptive equipment and posture/positioning to relieve shortness of breath.   Triggers and Exacerbations: - Group verbal and written instruction to review types of environmental triggers and ways to prevent exacerbations. Discuss weather changes, air quality and the benefits of nasal washing. Review warning signs and symptoms to help prevent infections. Discuss techniques for effective airway clearance, coughing, and vibrations.   AED/CPR: - Group verbal and written instruction with the use of models to demonstrate the basic use of the AED with the basic ABC's of resuscitation.   Anatomy and Physiology of the Lungs: - Group verbal and written instruction with the use of models to provide basic lung anatomy and physiology related to function, structure and complications of lung disease.   Anatomy & Physiology of the Heart: - Group verbal and written instruction and models provide basic cardiac anatomy and physiology, with the coronary electrical and arterial systems. Review of Valvular disease and  Heart Failure   Cardiac Medications: - Group verbal and written instruction to review commonly prescribed medications for heart disease. Reviews the medication, class of the drug, and side effects.   Pulmonary Rehab from 09/13/2019 in [facility] Cardiac and Pulmonary Rehab  Date 09/06/19  Educator SB  Instruction Review Code 1- Verbalizes Understanding      Other: -Provides group and verbal instruction on various topics (see comments)   Knowledge Questionnaire Score:  Knowledge Questionnaire Score - 09/06/19 1344      Knowledge Questionnaire Score   Post Score 16/18            Core Components/Risk Factors/Patient Goals at Admission:  Personal Goals and Risk Factors at Admission - 05/01/19 1423      Core Components/Risk Factors/Patient Goals on Admission    Weight Management Yes;Weight Loss;Weight Maintenance    Intervention Weight Management: Develop a combined nutrition and exercise program designed to reach desired caloric intake, while maintaining appropriate intake of nutrient and fiber, sodium and fats, and appropriate energy expenditure required for the weight goal.;Weight Management/Obesity: Establish reasonable short term and long term weight goals.;Weight Management: Provide education and appropriate resources to help participant work on and attain dietary goals.    Admit Weight 236 lb 11.2 oz (107.4 kg)    Goal Weight: Short Term 225 lb (102.1 kg)    Goal Weight: Long Term 215 lb (97.5 kg)    Expected Outcomes Long Term: Adherence to nutrition and physical activity/exercise program aimed toward attainment of established weight goal;Short Term: Continue to assess and modify interventions until short term weight is achieved;Weight Maintenance: Understanding of the daily nutrition guidelines, which includes 25-35% calories from fat, 7% or less cal from saturated fats, less than 243m cholesterol, less than 1.5gm of sodium, & 5 or more servings of fruits and vegetables daily;Weight  Loss: Understanding of general recommendations for a balanced deficit meal plan, which promotes 1-2 lb weight loss per week and includes a negative energy balance of [phone] kcal/d;Understanding recommendations for meals to include 15-35% energy as protein, 25-35% energy from fat, 35-60% energy from carbohydrates, less than 2073mof dietary cholesterol, 20-35 gm of total fiber daily;Understanding of distribution of calorie intake throughout the day with the consumption of 4-5 meals/snacks    Intervention Provide education, individualized exercise plan and daily activity instruction to help decrease symptoms of SOB with activities of daily living.    Expected Outcomes Short Term: Improve cardiorespiratory  fitness to achieve a reduction of symptoms when performing ADLs;Long Term: Be able to perform more ADLs without symptoms or delay the onset of symptoms    Diabetes Yes    Intervention Provide education about signs/symptoms and action to take for hypo/hyperglycemia.;Provide education about proper nutrition, including hydration, and aerobic/resistive exercise prescription along with prescribed medications to achieve blood glucose in normal ranges: Fasting glucose 65-99 mg/dL    Expected Outcomes Short Term: Participant verbalizes understanding of the signs/symptoms and immediate care of hyper/hypoglycemia, proper foot care and importance of medication, aerobic/resistive exercise and nutrition plan for blood glucose control.;Long Term: Attainment of HbA1C < 7%.    Hypertension Yes    Intervention Provide education on lifestyle modifcations including regular physical activity/exercise, weight management, moderate sodium restriction and increased consumption of fresh fruit, vegetables, and low fat dairy, alcohol moderation, and smoking cessation.;Monitor prescription use compliance.           Education:Diabetes - Individual verbal and written instruction to review signs/symptoms of diabetes, desired ranges  of glucose level fasting, after meals and with exercise. Acknowledge that pre and post exercise glucose checks will be done for 3 sessions at entry of program.   Pulmonary Rehab from 09/13/2019 in [facility] Cardiac and Pulmonary Rehab  Date 05/01/19  Educator AS  Instruction Review Code 1- Verbalizes Understanding      Education: Know Your Numbers and Risk Factors: -Group verbal and written instruction about important numbers in your health.  Discussion of what are risk factors and how they play a role in the disease process.  Review of Cholesterol, Blood Pressure, Diabetes, and BMI and the role they play in your overall health.   Core Components/Risk Factors/Patient Goals Review:   Goals and Risk Factor Review    [place] Name 05/24/19 1602 06/14/19 1537 08/07/19 1607 09/04/19 1425       Core Components/Risk Factors/Patient Goals Review   Personal Goals Review Weight Management/Obesity;Develop more efficient breathing techniques such as purse lipped breathing and diaphragmatic breathing and practicing self-pacing with activity.;Hypertension;Improve shortness of breath with ADL's Weight Management/Obesity;Develop more efficient breathing techniques such as purse lipped breathing and diaphragmatic breathing and practicing self-pacing with activity.;Hypertension;Improve shortness of breath with ADL's Weight Management/Obesity;Hypertension;Improve shortness of breath with ADL's;Diabetes;Lipids Weight Management/Obesity;Hypertension;Improve shortness of breath with ADL's;Diabetes;Lipids    Review Pt reports talking medication as directed, does not take BP at home, but has a cuff; encouraged to take daily at least when not at rehab. Pt reports ADL is hard, but manageable. Lori Browning is taking meds as directed.  She has started checking BP at home and it has been good.  She hasnt been able to tell a big difference in shortness of breath with ADLs.  She does feel stronger in her legs. Lori Browning is doing well in rehab.  Her  weight has started to creep up, but she is taking more lasix now.  She really wants to lose more.  Her breathing is not making much improvement yet.  Her legs are stronger, but not breathing better. She is using her PLB, but still struggling.  Her sugars have been good.  Her blood pressures have been good. Other than her breathing and weight gain, she feels good overall. Lori Browning has done well in rehab.  Her weight is steady and she plans to continue to lose weight.  She continues to work on her breathing and pressures.  She continues to montior her sugars too.  She will be graduating next week and plans to continue to keep  a close eye on her risk factors.    Expected Outcomes ST: take BP regularly. Short: continue to monitor BP at home Long: monitor risk factors Short: Continue to work on weight loss Long: Continue to  monitor risk factors. Continue to work on weight loss and monitor risk factors.           Core Components/Risk Factors/Patient Goals at Discharge (Final Review):   Goals and Risk Factor Review - 09/04/19 1425      Core Components/Risk Factors/Patient Goals Review   Personal Goals Review Weight Management/Obesity;Hypertension;Improve shortness of breath with ADL's;Diabetes;Lipids    Review Lori Browning has done well in rehab.  Her weight is steady and she plans to continue to lose weight.  She continues to work on her breathing and pressures.  She continues to montior her sugars too.  She will be graduating next week and plans to continue to keep a close eye on her risk factors.    Expected Outcomes Continue to work on weight loss and monitor risk factors.           ITP Comments:  ITP Comments    Row Name 04/27/19 1114 05/03/19 0614 05/03/19 1642 05/25/19 1341 05/31/19 0541   ITP Comments Virtual Orientation performed. Patient informed when to come in for RD and EP orientation. Diagnosis can be found in [facility] 04/20/2019 30 day chart review completed. ITP sent to Dr Zachery Dakins Medical  Director, for review,changes as needed and signature. Continue with ITP if no changes requested First full day of exercise!  Patient was oriented to gym and equipment including functions, settings, policies, and procedures.  Patient's individual exercise prescription and treatment plan were reviewed.  All starting workloads were established based on the results of the 6 minute walk test done at initial orientation visit.  The plan for exercise progression was also introduced and progression will be customized based on patient's performance and goals. Completed Initial RD Eval 30 Day review completed. Medical Director review done, changes made as directed,and approval shown by signature of Lori Browning researcher.   Row Name 06/28/19 0554 07/26/19 0551 08/23/19 0641 09/13/19 1353     ITP Comments 30 Day review completed. ITP review done, changes made as directed,and approval shown by signature of  Lori Browning, research (life sciences). 30 Day review completed. Medical Director ITP review done, changes made as directed, and signed approval by Medical Director. 30 Day review completed. Medical Director ITP review done, changes made as directed, and signed approval by Medical Director. Lori Browning graduated today from  rehab with 35 sessions completed.  Details of the patient's exercise prescription and what She needs to do in order to continue the prescription and progress were discussed with patient.  Patient was given a copy of prescription and goals.  Patient verbalized understanding.  Lori Browning plans to continue to exercise by walking at home.           Comments: Discharge ITP

## 2019-09-13 NOTE — Progress Notes (Signed)
Daily Session Note  Patient Details  Name: Lori Browning MRN: [id number] Date of Birth: [birth year] Referring Provider:     Pulmonary Rehab from 05/01/2019 in [facility]  Referring Provider [name]      Encounter Date: 09/13/2019  Check In:  Session Check In - 09/13/19 1351      Check-In   Supervising physician immediately available to respond to emergencies See telemetry face sheet for immediately available ER MD    Location [facility]-Cardiac & Pulmonary Rehab    Staff Present Renita Papa, RN BSN;Joseph Lou Miner, [place] Exercise Physiologist;Susanne Bice, RN, BSN, CCRP    Virtual Visit No    Medication changes reported     No    Fall or balance concerns reported    No    Warm-up and Cool-down Performed on first and last piece of equipment    Resistance Training Performed Yes    VAD Patient? No    PAD/SET Patient? No      Pain Assessment   Currently in Pain? No/denies              Social History   Tobacco Use  Smoking Status Never Smoker  Smokeless Tobacco Never Used    Goals Met:  Independence with exercise equipment Exercise tolerated well No report of cardiac concerns or symptoms Strength training completed today  Goals Unmet:  Not Applicable  Comments:  [name] graduated today from  rehab with 35 sessions completed.  Details of the patient's exercise prescription and what She needs to do in order to continue the prescription and progress were discussed with patient.  Patient was given a copy of prescription and goals.  Patient verbalized understanding.  [name] plans to continue to exercise by walking at home.    Dr. Emily Filbert is Medical Director for [place] and [facility].

## 2019-09-13 NOTE — Progress Notes (Signed)
Discharge Progress Report  Patient Details  Name: Lori Browning MRN: [id number] Date of Birth: [birth year] Referring Provider:     Pulmonary Rehab from 05/01/2019 in [facility]iac and Pulmonary Rehab  Referring Provider Aleskerov       Number of Visits: 35  Reason for Discharge:  Patient reached a stable level of exercise. Patient independent in their exercise. Patient has met program and personal goals.  Smoking History:  Social History   Tobacco Use  Smoking Status Never Smoker  Smokeless Tobacco Never Used    Diagnosis:  Chronic obstructive pulmonary disease, unspecified COPD type ([place])  ADL UCSD:  Pulmonary Assessment Scores    Row Name 05/01/19 1426 09/06/19 1342       ADL UCSD   SOB Score total 30 48    Rest 1 0    Walk 3 2    Stairs 4 4    Bath 2 1    Dress 2 2    Shop 3 2      CAT Score   CAT Score 25 14      mMRC Score   mMRC Score 3 --           Initial Exercise Prescription:  Initial Exercise Prescription - 05/01/19 1400      Date of Initial Exercise RX and Referring Provider   Date 05/01/19    Referring Provider Aleskerov      Treadmill   MPH 0.8    Grade 0    Minutes 15    METs 1      NuStep   Level 1    SPM 80    Minutes 15    METs 1      REL-XR   Level 1    Speed 50    Minutes 15    METs 1      T5 Nustep   Level 1    SPM 80    Minutes 15      Prescription Details   Frequency (times per week) 3    Duration Progress to 30 minutes of continuous aerobic without signs/symptoms of physical distress      Intensity   THRR 40-80% of Max Heartrate 93-120    Ratings of Perceived Exertion 11-13    Perceived Dyspnea 0-4      Resistance Training   Training Prescription Yes    Weight 3 lb    Reps 10-15           Discharge Exercise Prescription (Final Exercise Prescription Changes):  Exercise Prescription Changes - 09/06/19 1400      Response to Exercise   Blood Pressure (Admit) 124/60    Blood Pressure (Exercise)  136/64    Blood Pressure (Exit) 132/70    Heart Rate (Admit) 66 bpm    Heart Rate (Exercise) 97 bpm    Heart Rate (Exit) 71 bpm    Oxygen Saturation (Admit) 94 %    Oxygen Saturation (Exercise) 84 %    Oxygen Saturation (Exit) 93 %    Rating of Perceived Exertion (Exercise) 15    Perceived Dyspnea (Exercise) 3    Symptoms none    Duration Continue with 30 min of aerobic exercise without signs/symptoms of physical distress.    Intensity THRR unchanged      Progression   Progression Continue to progress workloads to maintain intensity without signs/symptoms of physical distress.    Average METs 2.5      Resistance Training   Training Prescription Yes  Weight 3 lb    Reps 10-15      Interval Training   Interval Training No      NuStep   Level 5    SPM 80    Minutes 15    METs 2           Functional Capacity:  6 Minute Walk    Row Name 05/01/19 1409 09/04/19 1347       6 Minute Walk   Phase Initial Discharge    Distance 600 feet 715 feet    Distance % Change -- 19.7 %    Distance Feet Change -- 115 ft    Walk Time 5 minutes 6 minutes    # of Rest Breaks 2 0    MPH 1.36 1.35    METS 1.77 0.42    RPE 15 15    Perceived Dyspnea  3 3    VO2 Peak 1.25 1.44    Symptoms Yes (comment) Yes (comment)    Comments shortness of breath back pain 4/10, SOB    Resting HR 65 bpm 60 bpm    Resting BP 124/74 124/60    Resting Oxygen Saturation  96 % 94 %    Exercise Oxygen Saturation  during 6 min walk 90 % 84 %    Max Ex. HR 96 bpm 97 bpm    Max Ex. BP 164/76 136/64    2 Minute Post BP 152/78 134/70      Interval HR   1 Minute HR 83 --    2 Minute HR 91 --    3 Minute HR 94 --    4 Minute HR 80  resting --    5 Minute HR 93 --    6 Minute HR 96 --    2 Minute Post HR 70 --    Interval Heart Rate? Yes --      Interval Oxygen   Interval Oxygen? Yes --    1 Minute Oxygen Saturation % 92 % --    1 Minute Liters of Oxygen 0 L --    2 Minute Oxygen Saturation % 90 %  --    2 Minute Liters of Oxygen 0 L --    3 Minute Oxygen Saturation % 90 % --    3 Minute Liters of Oxygen 0 L --    4 Minute Oxygen Saturation % 90 % --    4 Minute Liters of Oxygen 0 L --    5 Minute Oxygen Saturation % 92 % --    5 Minute Liters of Oxygen 0 L --    6 Minute Oxygen Saturation % 90 % --    6 Minute Liters of Oxygen 0 L --    2 Minute Post Oxygen Saturation % 96 % --    2 Minute Post Liters of Oxygen 0 L --           Psychological, QOL, Others - Outcomes: PHQ 2/9: Depression screen [facility] 2/9 09/06/2019 05/01/2019  Decreased Interest 1 0  Down, Depressed, Hopeless 0 0  PHQ - 2 Score 1 0  Altered sleeping 1 0  Tired, decreased energy 2 1  Change in appetite 1 0  Feeling bad or failure about yourself  0 0  Trouble concentrating 0 0  Moving slowly or fidgety/restless 0 0  Suicidal thoughts 0 -  PHQ-9 Score 5 1  Difficult doing work/chores Not difficult at all Not difficult at all    Quality of Life:  Nutrition & Weight - Outcomes:  Pre Biometrics - 05/01/19 1423      Pre Biometrics   Height 5' 6"  (1.676 m)    Weight 236 lb 11.2 oz (107.4 kg)    BMI (Calculated) 38.22    Single Leg Stand 0 seconds           Post Biometrics - 09/04/19 1349       Post  Biometrics   Height 5' 6"  (1.676 m)    Weight 234 lb 9.6 oz (106.4 kg)    BMI (Calculated) 37.88           Nutrition:  Nutrition Therapy & Goals - 08/28/19 1413      Nutrition Therapy   Diet Low Na, HH           Nutrition Discharge:  Nutrition Assessments - 09/06/19 1345      MEDFICTS Scores   Post Score 50           Education Questionnaire Score:  Knowledge Questionnaire Score - 09/06/19 1344      Knowledge Questionnaire Score   Post Score 16/18           Goals reviewed with patient; copy given to patient.

## 2019-09-20 ENCOUNTER — Ambulatory Visit: Payer: Medicare HMO | Admitting: Dermatology

## 2019-09-20 ENCOUNTER — Other Ambulatory Visit: Payer: Self-pay

## 2019-09-20 DIAGNOSIS — L821 Other seborrheic keratosis: Secondary | ICD-10-CM

## 2019-09-20 DIAGNOSIS — L814 Other melanin hyperpigmentation: Secondary | ICD-10-CM | POA: Diagnosis not present

## 2019-09-20 DIAGNOSIS — D2372 Other benign neoplasm of skin of left lower limb, including hip: Secondary | ICD-10-CM | POA: Diagnosis not present

## 2019-09-20 DIAGNOSIS — D485 Neoplasm of uncertain behavior of skin: Secondary | ICD-10-CM

## 2019-09-20 DIAGNOSIS — L905 Scar conditions and fibrosis of skin: Secondary | ICD-10-CM

## 2019-09-20 DIAGNOSIS — L308 Other specified dermatitis: Secondary | ICD-10-CM | POA: Diagnosis not present

## 2019-09-20 DIAGNOSIS — Z1283 Encounter for screening for malignant neoplasm of skin: Secondary | ICD-10-CM

## 2019-09-20 DIAGNOSIS — D1800 Hemangioma unspecified site: Secondary | ICD-10-CM

## 2019-09-20 DIAGNOSIS — L578 Other skin changes due to chronic exposure to nonionizing radiation: Secondary | ICD-10-CM

## 2019-09-20 DIAGNOSIS — D239 Other benign neoplasm of skin, unspecified: Secondary | ICD-10-CM

## 2019-09-20 DIAGNOSIS — Z86018 Personal history of other benign neoplasm: Secondary | ICD-10-CM

## 2019-09-20 DIAGNOSIS — D229 Melanocytic nevi, unspecified: Secondary | ICD-10-CM

## 2019-09-20 MED ORDER — TRIAMCINOLONE ACETONIDE 0.1 % EX CREA: 1.0000 "application " | TOPICAL_CREAM | Freq: Two times a day (BID) | CUTANEOUS | 1 refills | Status: DC | PRN

## 2019-09-20 NOTE — Patient Instructions (Addendum)
Wound Care Instructions  1. Cleanse wound gently with soap and water once a day then pat dry with clean gauze. Apply a thing coat of Petrolatum (petroleum jelly, "Vaseline") over the wound (unless you have an allergy to this). We recommend that you use a new, sterile tube of Vaseline. Do not pick or remove scabs. Do not remove the yellow or white "healing tissue" from the base of the wound.  2. Cover the wound with fresh, clean, nonstick gauze and secure with paper tape. You may use Band-Aids in place of gauze and tape if the would is small enough, but would recommend trimming much of the tape off as there is often too much. Sometimes Band-Aids can irritate the skin.  3. You should call the office for your biopsy report after 1 week if you have not already been contacted.  4. If you experience any problems, such as abnormal amounts of bleeding, swelling, significant bruising, significant pain, or evidence of infection, please call the office immediately.  5. FOR ADULT SURGERY PATIENTS: If you need something for pain relief you may take 1 extra strength Tylenol (acetaminophen) AND 2 Ibuprofen (200mg  each) together every 4 hours as needed for pain. (do not take these if you are allergic to them or if you have a reason you should not take them.) Typically, you may only need pain medication for 1 to 3 days.   Intralesional steroid injection side effects were reviewed including thinning of the skin and discoloration, such as redness, lightening or darkening.  Topical steroids (such as triamcinolone, fluocinolone, fluocinonide, mometasone, clobetasol, halobetasol, betamethasone, hydrocortisone) can cause thinning and lightening of the skin if they are used for too long in the same area. Your physician has selected the right strength medicine for your problem and area affected on the body. Please use your medication only as directed by your physician to prevent side effects.

## 2019-09-20 NOTE — Progress Notes (Signed)
Follow-Up Visit   Subjective  Lori Browning is a 86 y.o. female who presents for the following: full body skin exam and skin cancer screening  Patient here today for TBSE. She has a history of Atypical Melanocytic Proliferation could not rule out lentigo maligna at the right upper back. She does have some itch at the excision site.   The following portions of the chart were reviewed this encounter and updated as appropriate:  Tobacco  Allergies  Meds  Problems  Med Hx  Surg Hx  Fam Hx      Review of Systems:  No other skin or systemic complaints except as noted in HPI or Assessment and Plan.  Objective  Well appearing patient in no apparent distress; mood and affect are within normal limits.  A full examination was performed including scalp, head, eyes, ears, nose, lips, neck, chest, axillae, abdomen, back, buttocks, bilateral upper extremities, bilateral lower extremities, hands, feet, fingers, toes, fingernails, and toenails. All findings within normal limits unless otherwise noted below.  Objective  Right Upper Back: 0.8cm brown macule within scar   Objective  Left Thigh: Firm pink/brown papulenodule with dimple sign.   Objective  Right Knee: Hypertrophic scar  Objective  Right leg: Scaly erythematous papules and patches +/- dyspigmentation, lichenification, excoriations.    Assessment & Plan  Neoplasm of uncertain behavior of skin Right Upper Back  Epidermal / dermal shaving  Lesion diameter (cm):  0.8 Informed consent: discussed and consent obtained   Timeout: patient name, date of birth, surgical site, and procedure verified   Patient was prepped and draped in usual sterile fashion: area prepped with isopropyl alcohol. Anesthesia: the lesion was anesthetized in a standard fashion   Anesthetic:  1% lidocaine w/ epinephrine 1-100,000 buffered w/ 8.4% NaHCO3 Instrument used: flexible razor blade   Hemostasis achieved with: aluminum chloride   Outcome:  patient tolerated procedure well   Post-procedure details: wound care instructions given   Additional details:  Mupirocin and a bandage applied  Dermatofibroma Left Thigh  Benign-appearing.  Observation.  Call clinic for new or changing moles.  Recommend daily use of broad spectrum spf 30+ sunscreen to sun-exposed areas.    Scar Right Knee  Intralesional steroid injection side effects were reviewed including thinning of the skin and discoloration, such as redness, lightening or darkening.   Intralesional injection - Right Knee Location: right knee  Informed Consent: Discussed risks (infection, pain, bleeding, bruising, thinning of the skin, loss of skin pigment, lack of resolution, and recurrence of lesion) and benefits of the procedure, as well as the alternatives. Informed consent was obtained. Preparation: The area was prepared a standard fashion.  Procedure Details: An intralesional injection was performed with Kenalog 20 mg/cc.  0.3cc in total were injected.  Total number of injections: 6  Plan: The patient was instructed on post-op care. Recommend OTC analgesia as needed for pain.   Other eczema Right leg  Reviewed chronic nature. Currently flared.  Start TMC 0.1% cream to affected areas twice daily as needed for rash/itch. Avoid applying to face, groin, and axilla. Use as directed. Risk of skin atrophy with long-term use reviewed.   Topical steroids (such as triamcinolone, fluocinolone, fluocinonide, mometasone, clobetasol, halobetasol, betamethasone, hydrocortisone) can cause thinning and lightening of the skin if they are used for too long in the same area. Your physician has selected the right strength medicine for your problem and area affected on the body. Please use your medication only as directed by your physician  to prevent side effects.    Ordered Medications: triamcinolone cream (KENALOG) 0.1 %   Lentigines - Scattered tan macules - Discussed due to sun  exposure - Benign, observe - Call for any changes  Seborrheic Keratoses - Stuck-on, waxy, tan-brown papules and plaques  - Discussed benign etiology and prognosis. - Observe - Call for any changes  Melanocytic Nevi - Tan-brown and/or pink-flesh-colored symmetric macules and papules - Benign appearing on exam today - Observation - Call clinic for new or changing moles - Recommend daily use of broad spectrum spf 30+ sunscreen to sun-exposed areas.   Hemangiomas - Red papules - Discussed benign nature - Observe - Call for any changes  Actinic Damage - diffuse scaly erythematous macules with underlying dyspigmentation - Recommend daily broad spectrum sunscreen SPF 30+ to sun-exposed areas, reapply every 2 hours as needed.  - Call for new or changing lesions.  Skin cancer screening performed today.  History of Atypical Melanocytic Proliferation - No evidence of recurrence today at right upper back - No lymphadenopathy - Recommend regular full body skin exams - Recommend daily broad spectrum sunscreen SPF 30+ to sun-exposed areas, reapply every 2 hours as needed.  - Call if any new or changing lesions are noted between office visits   Return in about 6 months (around 03/22/2020) for TBSE.  Graciella Belton, RMA, am acting as scribe for Forest Gleason, MD .  Documentation: I have reviewed the above documentation for accuracy and completeness, and I agree with the above.  Forest Gleason, MD

## 2019-09-26 NOTE — Progress Notes (Signed)
SOLAR LENTIGO OVERLYING DERMAL SCAR "Freckle over the scar" --> no treatment needed  MAs please call

## 2019-09-27 ENCOUNTER — Telehealth: Payer: Self-pay

## 2019-09-27 NOTE — Telephone Encounter (Signed)
Patient advised bx benign freckle, JS

## 2019-09-27 NOTE — Telephone Encounter (Signed)
-----   Message from Alfonso Patten, MD sent at 09/26/2019  7:51 PM EDT ----- [place] "Freckle over the scar" --> no treatment needed  MAs please call

## 2019-10-01 ENCOUNTER — Encounter: Payer: Self-pay | Admitting: Dermatology

## 2020-03-21 ENCOUNTER — Ambulatory Visit: Payer: Medicare HMO | Admitting: Dermatology

## 2020-03-28 ENCOUNTER — Ambulatory Visit: Payer: Medicare HMO | Admitting: Dermatology

## 2020-03-28 ENCOUNTER — Other Ambulatory Visit: Payer: Self-pay

## 2020-03-28 DIAGNOSIS — L821 Other seborrheic keratosis: Secondary | ICD-10-CM

## 2020-03-28 DIAGNOSIS — D229 Melanocytic nevi, unspecified: Secondary | ICD-10-CM

## 2020-03-28 DIAGNOSIS — D2372 Other benign neoplasm of skin of left lower limb, including hip: Secondary | ICD-10-CM

## 2020-03-28 DIAGNOSIS — L578 Other skin changes due to chronic exposure to nonionizing radiation: Secondary | ICD-10-CM

## 2020-03-28 DIAGNOSIS — Z1283 Encounter for screening for malignant neoplasm of skin: Secondary | ICD-10-CM

## 2020-03-28 DIAGNOSIS — D1800 Hemangioma unspecified site: Secondary | ICD-10-CM

## 2020-03-28 DIAGNOSIS — Z86018 Personal history of other benign neoplasm: Secondary | ICD-10-CM

## 2020-03-28 DIAGNOSIS — D239 Other benign neoplasm of skin, unspecified: Secondary | ICD-10-CM

## 2020-03-28 DIAGNOSIS — L814 Other melanin hyperpigmentation: Secondary | ICD-10-CM

## 2020-03-28 NOTE — Progress Notes (Signed)
   Follow-Up Visit   Subjective  Lori Browning is a 87 y.o. female who presents for the following: TBSE (Patient here for full body skin exam and skin cancer screening. Patient with hx of dysplastic nevus. She is not aware of anything new or changing. ).   The following portions of the chart were reviewed this encounter and updated as appropriate:   Tobacco  Allergies  Meds  Problems  Med Hx  Surg Hx  Fam Hx      Review of Systems:  No other skin or systemic complaints except as noted in HPI or Assessment and Plan.  Objective  Well appearing patient in no apparent distress; mood and affect are within normal limits.  A full examination was performed including scalp, head, eyes, ears, nose, lips, neck, chest, axillae, abdomen, back, buttocks, bilateral upper extremities, bilateral lower extremities, hands, feet, fingers, toes, fingernails, and toenails. All findings within normal limits unless otherwise noted below.     Assessment & Plan     Dermatofibroma Left Thigh - Firm pink/brown papulenodule with dimple sign - Benign appearing - Call for any changes  Lentigines - Scattered tan macules - Discussed due to sun exposure - Benign, observe - Call for any changes  Seborrheic Keratoses - Stuck-on, waxy, tan-brown papules and plaques  - Discussed benign etiology and prognosis. - Observe - Call for any changes  Melanocytic Nevi - Tan-brown and/or pink-flesh-colored symmetric macules and papules - Benign appearing on exam today - Observation - Call clinic for new or changing moles - Recommend daily use of broad spectrum spf 30+ sunscreen to sun-exposed areas.   Hemangiomas - Red papules - Discussed benign nature - Observe - Call for any changes  Actinic Damage - Chronic, secondary to cumulative UV/sun exposure - diffuse scaly erythematous macules with underlying dyspigmentation - Recommend daily broad spectrum sunscreen SPF 30+ to sun-exposed areas, reapply  every 2 hours as needed.  - Call for new or changing lesions.  Skin cancer screening performed today.  History of Dysplastic Nevi - could not r/o early evolving lentigo maligna - No evidence of recurrence today at right upper back, severe/excised 06/2017 - No lymphadenopathy - Recommend regular full body skin exams - Recommend daily broad spectrum sunscreen SPF 30+ to sun-exposed areas, reapply every 2 hours as needed.  - Call if any new or changing lesions are noted between office visits  Return in about 9 months (around 12/26/2020) for TBSE.  Graciella Belton, RMA, am acting as scribe for Forest Gleason, MD .  Documentation: I have reviewed the above documentation for accuracy and completeness, and I agree with the above.  Forest Gleason, MD

## 2020-03-28 NOTE — Patient Instructions (Signed)

## 2020-04-08 ENCOUNTER — Encounter: Payer: Self-pay | Admitting: Dermatology

## 2020-04-26 ENCOUNTER — Other Ambulatory Visit: Payer: Self-pay | Admitting: Internal Medicine

## 2020-04-26 DIAGNOSIS — M549 Dorsalgia, unspecified: Secondary | ICD-10-CM

## 2020-04-26 DIAGNOSIS — D72820 Lymphocytosis (symptomatic): Secondary | ICD-10-CM

## 2020-04-26 DIAGNOSIS — M5430 Sciatica, unspecified side: Secondary | ICD-10-CM

## 2020-04-27 ENCOUNTER — Other Ambulatory Visit: Payer: Self-pay

## 2020-04-27 ENCOUNTER — Emergency Department
Admission: EM | Admit: 2020-04-27 | Discharge: 2020-04-27 | Disposition: A | Discharge status: Home / Self Care | Payer: Medicare HMO | Attending: Emergency Medicine | Admitting: Emergency Medicine

## 2020-04-27 DIAGNOSIS — I110 Hypertensive heart disease with heart failure: Secondary | ICD-10-CM | POA: Diagnosis not present

## 2020-04-27 DIAGNOSIS — Z96649 Presence of unspecified artificial hip joint: Secondary | ICD-10-CM | POA: Insufficient documentation

## 2020-04-27 DIAGNOSIS — Z96659 Presence of unspecified artificial knee joint: Secondary | ICD-10-CM | POA: Diagnosis not present

## 2020-04-27 DIAGNOSIS — Z79899 Other long term (current) drug therapy: Secondary | ICD-10-CM | POA: Insufficient documentation

## 2020-04-27 DIAGNOSIS — R040 Epistaxis: Secondary | ICD-10-CM | POA: Diagnosis present

## 2020-04-27 DIAGNOSIS — J449 Chronic obstructive pulmonary disease, unspecified: Secondary | ICD-10-CM | POA: Diagnosis not present

## 2020-04-27 DIAGNOSIS — I509 Heart failure, unspecified: Secondary | ICD-10-CM | POA: Insufficient documentation

## 2020-04-27 MED ORDER — OXYMETAZOLINE HCL 0.05 % NA SOLN
1.0000 | Freq: Once | NASAL | Status: AC
  Administered 2020-04-27: 1 via NASAL
  Filled 2020-04-27: qty 30

## 2020-04-27 MED ORDER — AMOXICILLIN 875 MG PO TABS: 875.0000 mg | ORAL_TABLET | Freq: Two times a day (BID) | ORAL | 0 refills | Status: DC

## 2020-04-27 MED ORDER — LIDOCAINE VISCOUS HCL 2 % MT SOLN
15.0000 mL | Freq: Once | OROMUCOSAL | Status: AC
  Administered 2020-04-27: 15 mL via OROMUCOSAL

## 2020-04-27 NOTE — ED Triage Notes (Signed)
Pt c/o epistaxis since 0500 with no injury. Denies pain. Denies blood thinner use.

## 2020-04-27 NOTE — ED Provider Notes (Signed)
[facility] Emergency Department Provider Note   ____________________________________________    I have reviewed the triage vital signs and the nursing notes.   HISTORY  Chief Complaint Epistaxis     HPI Lori Browning is a 87 y.o. female with a history as noted below who presents with complaints of epistaxis.  Patient reports epistaxis started at approximately 5 AM, she woke up to go to the restroom and bleeding started.  She reports right naris primarily but also some bleeding from the left.  Denies blood thinners.  No trauma or injury.  No fevers chills.  Past Medical History:  Diagnosis Date  . Atypical mole 07/07/2017   right upper back/severe. excision 07/07/2017. AMP  . CHF (congestive heart failure) ([place])   . COPD (chronic obstructive pulmonary disease) ([place])   . Hyperlipemia   . Hypertension   . Urge incontinence     Patient Active Problem List   Diagnosis Date Noted  . Acute CHF (congestive heart failure) ([place]) 04/30/2018    Past Surgical History:  Procedure Laterality Date  . BREAST CYST EXCISION    . CATARACT EXTRACTION    . hip replacment    . REPLACEMENT TOTAL KNEE      Prior to Admission medications   Medication Sig Start Date End Date Taking? Authorizing Provider  albuterol (PROVENTIL) (2.5 MG/3ML) 0.083% nebulizer solution Take 2.5 mg by nebulization every 6 (six) hours as needed for wheezing or shortness of breath.    [provider]  carvedilol (COREG) 25 MG tablet Take 25 mg by mouth 2 (two) times daily with a meal.    [provider]  Cranberry 500 MG CAPS Take 1 tablet by mouth daily.     [provider]  furosemide (LASIX) 40 MG tablet Take 1 tablet (40 mg total) by mouth daily. 05/07/18   Stark Jock Jude, MD  pravastatin (PRAVACHOL) 40 MG tablet Take 40 mg by mouth daily. 10/09/11   [provider]  spironolactone (ALDACTONE) 25 MG tablet Take 1 tablet (25 mg total) by mouth daily.  05/07/18   Stark Jock Jude, MD  triamcinolone cream (KENALOG) 0.1 % Apply 1 application topically 2 (two) times daily as needed. Avoid face, groin, underarms. 09/20/19   Moye, [place], MD     Allergies Sulfa antibiotics  Family History  Problem Relation Age of Onset  . Hypertension Mother     Social History Social History   Tobacco Use  . Smoking status: Never Smoker  . Smokeless tobacco: Never Used  Substance Use Topics  . Alcohol use: Never  . Drug use: Never    Review of Systems  Constitutional: No fever/chills   ENT: As above Cardiovascular: Denies chest pain. Respiratory: Denies shortness of breath. Gastrointestinal: No abdominal pain.     Neurological: Negative for headaches   ____________________________________________   PHYSICAL EXAM:  VITAL SIGNS: ED Triage Vitals  Enc Vitals Group     BP 04/27/20 0549 (!) 170/80     Pulse Rate 04/27/20 0549 70     Resp 04/27/20 0549 18     Temp 04/27/20 0549 97.9 F (36.6 C)     Temp Source 04/27/20 0549 Oral     SpO2 04/27/20 0549 93 %     Weight 04/27/20 0548 104.8 kg (231 lb)     Height 04/27/20 0548 1.676 m (5\' 6" )     Head Circumference --      Peak Flow --      Pain Score  04/27/20 0551 0     Pain Loc --      Pain Edu? --      Excl. in [place]? --     Constitutional: Alert and oriented. No acute distress.  Eyes: Conjunctivae are normal.  Head: Atraumatic. Nose: Epistaxis, primarily from right naris, Mouth/Throat: Mucous membranes are moist.  Some blood noted in the back of the throat, small amount  Cardiovascular: Normal rate, regular rhythm. Grossly normal heart sounds.  Good peripheral circulation. Respiratory: Normal respiratory effort.  No retractions. Lungs CTAB.  Musculoskeletal: No lower extremity tenderness nor edema.  Warm and well perfused Neurologic:  Normal speech and language. No gross focal neurologic deficits are appreciated.  Skin:  Skin is warm, dry and intact. No rash noted. Psychiatric:  Mood and affect are normal. Speech and behavior are normal.  ____________________________________________   LABS (all labs ordered are listed, but only abnormal results are displayed)  Labs Reviewed - No data to display ____________________________________________  EKG  None ____________________________________________  RADIOLOGY  None ____________________________________________   PROCEDURES  Procedure(s) performed: yes  .Epistaxis Management  Date/Time: 04/27/2020 7:00 AM Performed by: Lavonia Drafts, MD Authorized by: Lavonia Drafts, MD   Consent:    Consent obtained:  Verbal   Consent given by:  Patient   Risks discussed:  Bleeding, nasal injury and pain Universal protocol:    Patient identity confirmed:  Verbally with patient Procedure details:    Treatment site:  Unable to specify   Treatment method:  Merocel sponge   Treatment complexity:  Limited Post-procedure details:    Assessment:  Bleeding decreased   Procedure completion:  Tolerated     Critical Care performed: No ____________________________________________   INITIAL IMPRESSION / ASSESSMENT AND PLAN / ED COURSE  Pertinent labs & imaging results that were available during my care of the patient were reviewed by me and considered in my medical decision making (see chart for details).  Patient presents with epistaxis as above, EMS administered Afrin, nasal clamp applied here.  If unsuccessful will proceed with Merocel   ----------------------------------------- 7:05 AM on 04/27/2020 ----------------------------------------- Merisel placed, bleeding improved    ____________________________________________   FINAL CLINICAL IMPRESSION(S) / ED DIAGNOSES  Final diagnoses:  Epistaxis        Note:  This document was prepared using Dragon voice recognition software and may include unintentional dictation errors.   Lavonia Drafts, MD 04/27/20 [phone]

## 2020-04-27 NOTE — ED Provider Notes (Signed)
Procedures     ----------------------------------------- 8:18 AM on 04/27/2020 -----------------------------------------  Remains hemostatic.  Left naris clear without bleeding.  No blood in the oropharynx, patient does not note any blood drainage and feels fine.  Will start amoxicillin prophylaxis, follow-up with ENT.    Carrie Mew, MD 04/27/20 [phone]

## 2020-04-27 NOTE — ED Notes (Signed)
EDP at bedside  

## 2020-05-03 ENCOUNTER — Other Ambulatory Visit: Payer: Self-pay

## 2020-05-03 ENCOUNTER — Ambulatory Visit
Admission: RE | Admit: 2020-05-03 | Discharge: 2020-05-03 | Disposition: A | Discharge status: Home / Self Care | Payer: Medicare HMO | Source: Ambulatory Visit | Attending: Internal Medicine | Admitting: Internal Medicine

## 2020-05-03 DIAGNOSIS — K5730 Diverticulosis of large intestine without perforation or abscess without bleeding: Secondary | ICD-10-CM | POA: Insufficient documentation

## 2020-05-03 DIAGNOSIS — M5430 Sciatica, unspecified side: Secondary | ICD-10-CM | POA: Diagnosis present

## 2020-05-03 DIAGNOSIS — M549 Dorsalgia, unspecified: Secondary | ICD-10-CM | POA: Insufficient documentation

## 2020-05-03 DIAGNOSIS — I700 Atherosclerosis of aorta: Secondary | ICD-10-CM | POA: Insufficient documentation

## 2020-05-03 DIAGNOSIS — R161 Splenomegaly, not elsewhere classified: Secondary | ICD-10-CM | POA: Insufficient documentation

## 2020-05-03 DIAGNOSIS — D72820 Lymphocytosis (symptomatic): Secondary | ICD-10-CM | POA: Diagnosis present

## 2020-05-03 MED ORDER — IOHEXOL 300 MG/ML  SOLN
100.0000 mL | Freq: Once | INTRAMUSCULAR | Status: AC | PRN
  Administered 2020-05-03: 100 mL via INTRAVENOUS

## 2020-05-06 ENCOUNTER — Inpatient Hospital Stay: Payer: Medicare HMO | Attending: Oncology | Admitting: Oncology

## 2020-05-06 ENCOUNTER — Inpatient Hospital Stay: Payer: Medicare HMO

## 2020-05-06 VITALS — BP 148/63 | HR 59 | Temp 96.9°F | Wt 230.6 lb

## 2020-05-06 DIAGNOSIS — D72820 Lymphocytosis (symptomatic): Secondary | ICD-10-CM | POA: Insufficient documentation

## 2020-05-06 LAB — CBC WITH DIFFERENTIAL/PLATELET
Abs Immature Granulocytes: 0.06 10*3/uL (ref 0.00–0.07)
Basophils Absolute: 0.2 10*3/uL — ABNORMAL HIGH (ref 0.0–0.1)
Basophils Relative: 1 %
Eosinophils Absolute: 0.3 10*3/uL (ref 0.0–0.5)
Eosinophils Relative: 1 %
HCT: 45.4 % (ref 36.0–46.0)
Hemoglobin: 14.5 g/dL (ref 12.0–15.0)
Immature Granulocytes: 0 %
Lymphocytes Relative: 69 %
Lymphs Abs: 20.4 10*3/uL — ABNORMAL HIGH (ref 0.7–4.0)
MCH: 28.7 pg (ref 26.0–34.0)
MCHC: 31.9 g/dL (ref 30.0–36.0)
MCV: 89.7 fL (ref 80.0–100.0)
Monocytes Absolute: 4.3 10*3/uL — ABNORMAL HIGH (ref 0.1–1.0)
Monocytes Relative: 15 %
Neutro Abs: 4.1 10*3/uL (ref 1.7–7.7)
Neutrophils Relative %: 14 %
Platelets: 162 10*3/uL (ref 150–400)
RBC: 5.06 MIL/uL (ref 3.87–5.11)
RDW: 14.9 % (ref 11.5–15.5)
Smear Review: NORMAL
WBC Morphology: ABNORMAL
WBC: 29.3 10*3/uL — ABNORMAL HIGH (ref 4.0–10.5)
nRBC: 0 % (ref 0.0–0.2)

## 2020-05-07 ENCOUNTER — Encounter: Payer: Self-pay | Admitting: Oncology

## 2020-05-07 LAB — PATHOLOGIST SMEAR REVIEW

## 2020-05-07 NOTE — Progress Notes (Signed)
Hematology/Oncology Consult note [facility] [phone] [phone]  Patient Care Team: Kirk Ruths, MD as PCP - General (Internal Medicine) Alisa Graff, FNP as Nurse Practitioner (Family Medicine) Isaias Cowman, MD as Consulting Physician (Cardiology)   Name of the patient: Lori Browning  [id number]  [birth year]    Reason for referral-lymphocytosis   Referring physician-Dr. Ouida Sills  Date of visit: 05/07/20   History of presenting illness-patient is a 87 year old female with a past medical history significant for hypertension and hyperlipidemia.She has been referred to [place] for lymphocytosis.  Most recently her CBC showed white count of 26.4, H&H of 14.8/46.1 and platelet count of 159.  Differential mainly showed lymphocytosis with some basophilia and monocytosis.  Patient has had chronic leukocytosis/lymphocytosis at least dating back to 2017 but since this year her white count has been in the 20s.  Hemoglobin and platelets have always been normal.  Differential in the past also has shown mainly lymphocytosis and some monocytosis.  Patient presently complains of pain in her right paraspinal area.  Reports that the pain worsens when she moves around or bends.  She had a CT abdomen and pelvis with contrast done a couple of days ago which showed liver cysts and mild splenomegaly of 14 cm.  No intra-abdominal adenopathy no other acute intra-abdominal pathology.  Patient reports that her appetite and weight have remained stable.  Denies any drenching night sweats.  ECOG PS- 1  Pain scale- 0   Review of systems- Review of Systems  Constitutional: Negative for chills, fever, malaise/fatigue and weight loss.  HENT: Negative for congestion, ear discharge and nosebleeds.   Eyes: Negative for blurred vision.  Respiratory: Negative for cough, hemoptysis, sputum production, shortness of breath and wheezing.   Cardiovascular: Negative  for chest pain, palpitations, orthopnea and claudication.  Gastrointestinal: Negative for abdominal pain, blood in stool, constipation, diarrhea, heartburn, melena, nausea and vomiting.  Genitourinary: Negative for dysuria, flank pain, frequency, hematuria and urgency.  Musculoskeletal: Negative for back pain, joint pain and myalgias.       Right paraspinal pain around the T10 vertebral body  Skin: Negative for rash.  Neurological: Negative for dizziness, tingling, focal weakness, seizures, weakness and headaches.  Endo/Heme/Allergies: Does not bruise/bleed easily.  Psychiatric/Behavioral: Negative for depression and suicidal ideas. The patient does not have insomnia.     Allergies  Allergen Reactions  . Sulfa Antibiotics Nausea And Vomiting and Rash    Patient Active Problem List   Diagnosis Date Noted  . Acute CHF (congestive heart failure) ([place]) 04/30/2018     Past Medical History:  Diagnosis Date  . Atypical mole 07/07/2017   right upper back/severe. excision 07/07/2017. AMP  . CHF (congestive heart failure) ([place])   . COPD (chronic obstructive pulmonary disease) ([place])   . Hyperlipemia   . Hypertension   . Urge incontinence      Past Surgical History:  Procedure Laterality Date  . BREAST CYST EXCISION    . CATARACT EXTRACTION    . hip replacment    . REPLACEMENT TOTAL KNEE      Social History   Socioeconomic History  . Marital status: Widowed    Spouse name: Not on file  . Number of children: Not on file  . Years of education: Not on file  . Highest education level: Not on file  Occupational History  . Not on file  Tobacco Use  . Smoking status: Never Smoker  . Smokeless tobacco: Never Used  Substance and  Sexual Activity  . Alcohol use: Never  . Drug use: Never  . Sexual activity: Not on file  Other Topics Concern  . Not on file  Social History Narrative  . Not on file   Social Determinants of Health   Financial Resource Strain: Not on file  Food  Insecurity: Not on file  Transportation Needs: Not on file  Physical Activity: Not on file  Stress: Not on file  Social Connections: Not on file  Intimate Partner Violence: Not on file     Family History  Problem Relation Age of Onset  . Hypertension Mother      Current Outpatient Medications:  .  carvedilol (COREG) 25 MG tablet, Take 25 mg by mouth 2 (two) times daily with a meal., Disp: , Rfl:  .  Cranberry 500 MG CAPS, Take 1 tablet by mouth daily. , Disp: , Rfl:  .  furosemide (LASIX) 40 MG tablet, Take 1 tablet (40 mg total) by mouth daily., Disp: 30 tablet, Rfl: 0 .  pravastatin (PRAVACHOL) 40 MG tablet, Take 40 mg by mouth daily., Disp: , Rfl:  .  albuterol (PROVENTIL) (2.5 MG/3ML) 0.083% nebulizer solution, Take 2.5 mg by nebulization every 6 (six) hours as needed for wheezing or shortness of breath., Disp: , Rfl:    Physical exam:  Vitals:   05/06/20 1517  BP: (!) 148/63  Pulse: (!) 59  Temp: (!) 96.9 F (36.1 C)  TempSrc: Tympanic  SpO2: 92%  Weight: 230 lb 9.6 oz (104.6 kg)   Physical Exam Cardiovascular:     Rate and Rhythm: Normal rate and regular rhythm.     Heart sounds: Normal heart sounds.  Pulmonary:     Effort: Pulmonary effort is normal.     Breath sounds: Normal breath sounds.  Abdominal:     General: Bowel sounds are normal.     Palpations: Abdomen is soft.  Lymphadenopathy:     Comments: No palpable cervical, supraclavicular, axillary or inguinal adenopathy   Skin:    General: Skin is warm and dry.  Neurological:     Mental Status: She is alert and oriented to person, place, and time.        CMP Latest Ref Rng & Units 05/05/2018  Glucose 70 - 99 mg/dL 166(H)  BUN 8 - 23 mg/dL 55(H)  Creatinine 0.44 - 1.00 mg/dL 1.13(H)  Sodium 135 - 145 mmol/L 137  Potassium 3.5 - 5.1 mmol/L 3.9  Chloride 98 - 111 mmol/L 96(L)  CO2 22 - 32 mmol/L 32  Calcium 8.9 - 10.3 mg/dL 8.9  Total Protein 6.5 - 8.1 g/dL -  Total Bilirubin 0.3 - 1.2 mg/dL -   Alkaline Phos 38 - 126 U/L -  AST 15 - 41 U/L -  ALT 0 - 44 U/L -   CBC Latest Ref Rng & Units 05/06/2020  WBC 4.0 - 10.5 K/uL 29.3(H)  Hemoglobin 12.0 - 15.0 g/dL 14.5  Hematocrit 36.0 - 46.0 % 45.4  Platelets 150 - 400 K/uL 162    No images are attached to the encounter.  CT ABDOMEN PELVIS W CONTRAST  Result Date: 05/05/2020 CLINICAL DATA:  Lymphocytosis and back pain with sciatica. EXAM: CT ABDOMEN AND PELVIS WITH CONTRAST TECHNIQUE: Multidetector CT imaging of the abdomen and pelvis was performed using the standard protocol following bolus administration of intravenous contrast. CONTRAST:  171mL OMNIPAQUE IOHEXOL 300 MG/ML  SOLN COMPARISON:  CT chest 05/04/2018, CT abdomen pelvis 12/20/2003 FINDINGS: Lower chest: Trace pericardial effusion. Elevated left hemidiaphragm.  No acute abnormality. Hepatobiliary: Several fluid density lesions within the liver likely represent simple hepatic cyst. Subcentimeter hypodensities are too small to characterize. No gallstones, gallbladder wall thickening, or pericholecystic fluid. No biliary dilatation. Pancreas: No focal lesion. Normal pancreatic contour. No surrounding inflammatory changes. No main pancreatic ductal dilatation. Spleen: The spleen is enlarged measuring up to 14 cm. No focal abnormality. Adrenals/Urinary Tract: No adrenal nodule bilaterally. Bilateral kidneys enhance symmetrically. No hydronephrosis. No hydroureter. The urinary bladder is unremarkable. Stomach/Bowel: PO contrast reaches the large bowel. Stomach is within normal limits. No evidence of bowel wall thickening or dilatation. Scattered colonic diverticulosis. Appendix appears normal. Vascular/Lymphatic: No abdominal aorta or iliac aneurysm. Mild to moderate atherosclerotic plaque of the aorta and its branches. No abdominal, pelvic, or inguinal lymphadenopathy. Reproductive: The uterus and bilateral adnexal regions are unremarkable. There is slight arm evaluation due to streak artifact  originating from bilateral femoral surgical hardware. Other: No intraperitoneal free fluid. No intraperitoneal free gas. No organized fluid collection. Musculoskeletal: No abdominal wall hernia or abnormality. No suspicious lytic or blastic osseous lesions. No acute displaced fracture. L3 vertebral body hemangioma. Multilevel degenerative changes of the spine. 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 IMPRESSION: 1. Mild splenomegaly. 2. Multiple fluid density lesions within the liver that likely represent simple hepatic cysts. Subcentimeter hypodensities are too small to characterize. 3. Colonic diverticulosis with no acute diverticulitis. 4.  Aortic Atherosclerosis (ICD10-I70.0). Electronically Signed   By: Iven Finn M.D.   On: 05/05/2020 17:44    Assessment and plan- Patient is a 87 y.o. female referred for leukocytosis/lymphocytosis  Patient has had persistent leukocytosis/lymphocytosis at least dating back to 2018 but since this ER her white count has increased from her prior value of 14 in 2018 to the 20s.  Differential mainly shows lymphocytosis with some monocytosis as well.  I will check CBC with differential, pathology smear review, peripheral flow cytometry and BCR ABL FISH testing today.Clinically do not palpate any significant adenopathy or splenomegaly.  She was noted to have mild splenomegaly on her CT abdomen.  I will see her back in 2 weeks time to discuss the results of the blood work and further work-up.   Discussed with the patient that if flow cytometry suggestive of CLL which can be seen in patients with persistent lymphocytosis-she would not meet any criteria for treatment of CLL at this time in the absence of B symptoms, significant cytopenias or bulky adenopathy.  Thank you for this kind referral and the opportunity to participate in the care of this patient   Visit Diagnosis 1. Lymphocytosis     Dr. Randa Evens, MD, MPH [facility] [phone] 05/07/2020  10:51 AM

## 2020-05-08 LAB — COMP PANEL: LEUKEMIA/LYMPHOMA: Immunophenotypic Profile: 51

## 2020-05-10 LAB — BCR-ABL1 FISH
Cells Analyzed: 200
Cells Counted: 200

## 2020-05-21 ENCOUNTER — Inpatient Hospital Stay: Payer: Medicare HMO

## 2020-05-21 ENCOUNTER — Inpatient Hospital Stay: Payer: Medicare HMO | Attending: Oncology | Admitting: Oncology

## 2020-05-21 VITALS — BP 151/66 | HR 59 | Temp 97.7°F | Resp 18 | Wt 229.2 lb

## 2020-05-21 DIAGNOSIS — E785 Hyperlipidemia, unspecified: Secondary | ICD-10-CM | POA: Insufficient documentation

## 2020-05-21 DIAGNOSIS — D72820 Lymphocytosis (symptomatic): Secondary | ICD-10-CM | POA: Diagnosis present

## 2020-05-21 DIAGNOSIS — D47Z9 Other specified neoplasms of uncertain behavior of lymphoid, hematopoietic and related tissue: Secondary | ICD-10-CM

## 2020-05-21 DIAGNOSIS — I110 Hypertensive heart disease with heart failure: Secondary | ICD-10-CM | POA: Diagnosis not present

## 2020-05-21 LAB — CBC WITH DIFFERENTIAL/PLATELET
Abs Immature Granulocytes: 0.05 10*3/uL (ref 0.00–0.07)
Basophils Absolute: 0.1 10*3/uL (ref 0.0–0.1)
Basophils Relative: 1 %
Eosinophils Absolute: 0.2 10*3/uL (ref 0.0–0.5)
Eosinophils Relative: 1 %
HCT: 42.6 % (ref 36.0–46.0)
Hemoglobin: 13.5 g/dL (ref 12.0–15.0)
Immature Granulocytes: 0 %
Lymphocytes Relative: 65 %
Lymphs Abs: 12.7 10*3/uL — ABNORMAL HIGH (ref 0.7–4.0)
MCH: 28.4 pg (ref 26.0–34.0)
MCHC: 31.7 g/dL (ref 30.0–36.0)
MCV: 89.7 fL (ref 80.0–100.0)
Monocytes Absolute: 2.6 10*3/uL — ABNORMAL HIGH (ref 0.1–1.0)
Monocytes Relative: 13 %
Neutro Abs: 3.9 10*3/uL (ref 1.7–7.7)
Neutrophils Relative %: 20 %
Platelets: 160 10*3/uL (ref 150–400)
RBC: 4.75 MIL/uL (ref 3.87–5.11)
RDW: 14.7 % (ref 11.5–15.5)
Smear Review: NORMAL
WBC Morphology: ABNORMAL
WBC: 19.5 10*3/uL — ABNORMAL HIGH (ref 4.0–10.5)
nRBC: 0 % (ref 0.0–0.2)

## 2020-05-22 ENCOUNTER — Telehealth: Payer: Self-pay | Admitting: Oncology

## 2020-05-22 NOTE — Progress Notes (Signed)
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  Most recently her CBC showed white count of 26.4, H&H of 14.8/46.1 and platelet count of 159.  Differential mainly showed lymphocytosis with some basophilia and monocytosis.  Patient has had chronic leukocytosis/lymphocytosis at least dating back to 2017 but since this year her white count has been in the 20s.  Hemoglobin and platelets have always been normal.  Differential in the past also has shown mainly lymphocytosis and some monocytosis.  Patient presently complains of pain in her right paraspinal area.  Reports that the pain worsens when she moves around or bends.  She had a CT abdomen and pelvis with contrast done a couple of days ago which showed liver cysts and mild splenomegaly of 14 cm.  No intra-abdominal adenopathy no other acute intra-abdominal pathology.  Patient reports that her appetite and weight have remained stable.   Results of blood work from 05/06/2020 were as follows: CBC showed white cell count of 29.3, H&H of 14.5/45.4 with a platelet count of 162.  Differential mainly showed lymphocytosis with an absolute lymphocyte count of 20 with some monocytosis and basophilia.  BCR ABL FISH testing was negative.  Smear review showed monomorphic  lymphoid cells which were large with abundant cytoplasm.  Morphology indicative of a lymphoid neoplasm.  Differentiations include CLL with variant morphology, leukemic phase of lymphoma such as mantle cell lymphoma or prolymphocytic leukemia.  Flow cytometry showed CD5 negative CD10 negative and CD 103 - B-cell lymphoproliferative disorder.  Interval history-patient presently reports doing well and denies any specific complaints at this time  ECOG PS- 1 Pain scale- 0   Review of systems- Review of Systems  Constitutional: Negative for chills, fever, malaise/fatigue and weight loss.  HENT: Negative for congestion, ear discharge and nosebleeds.   Eyes: Negative for blurred vision.  Respiratory: Negative for cough, hemoptysis, sputum production, shortness of breath and wheezing.   Cardiovascular: Negative for chest pain, palpitations, orthopnea and claudication.  Gastrointestinal: Negative for abdominal pain, blood in stool, constipation, diarrhea, heartburn, melena, nausea and vomiting.  Genitourinary: Negative for dysuria, flank pain, frequency, hematuria and urgency.  Musculoskeletal: Negative for back pain, joint pain and myalgias.  Skin: Negative for rash.  Neurological: Negative for dizziness, tingling, focal weakness, seizures, weakness and headaches.  Endo/Heme/Allergies: Does not bruise/bleed easily.  Psychiatric/Behavioral: Negative for depression and suicidal ideas. The patient does not have insomnia.       Allergies  Allergen Reactions  . Sulfa Antibiotics Nausea And Vomiting and Rash     Past Medical History:  Diagnosis Date  . Atypical mole 07/07/2017   right upper back/severe. excision 07/07/2017. AMP  . CHF (congestive heart failure) ([place])   . COPD (chronic obstructive pulmonary disease) ([place])   . Hyperlipemia   . Hypertension   . Urge incontinence      Past Surgical History:  Procedure Laterality Date  . BREAST CYST EXCISION    .  CATARACT EXTRACTION    . hip  replacment    . REPLACEMENT TOTAL KNEE      Social History   Socioeconomic History  . Marital status: Widowed    Spouse name: Not on file  . Number of children: Not on file  . Years of education: Not on file  . Highest education level: Not on file  Occupational History  . Not on file  Tobacco Use  . Smoking status: Never Smoker  . Smokeless tobacco: Never Used  Substance and Sexual Activity  . Alcohol use: Never  . Drug use: Never  . Sexual activity: Not on file  Other Topics Concern  . Not on file  Social History Narrative  . Not on file   Social Determinants of Health   Financial Resource Strain: Not on file  Food Insecurity: Not on file  Transportation Needs: Not on file  Physical Activity: Not on file  Stress: Not on file  Social Connections: Not on file  Intimate Partner Violence: Not on file    Family History  Problem Relation Age of Onset  . Hypertension Mother      Current Outpatient Medications:  .  albuterol (PROVENTIL) (2.5 MG/3ML) 0.083% nebulizer solution, Take 2.5 mg by nebulization every 6 (six) hours as needed for wheezing or shortness of breath., Disp: , Rfl:  .  carvedilol (COREG) 25 MG tablet, Take 25 mg by mouth 2 (two) times daily with a meal., Disp: , Rfl:  .  Cranberry 500 MG CAPS, Take 1 tablet by mouth daily. , Disp: , Rfl:  .  furosemide (LASIX) 40 MG tablet, Take 1 tablet (40 mg total) by mouth daily., Disp: 30 tablet, Rfl: 0 .  pravastatin (PRAVACHOL) 40 MG tablet, Take 40 mg by mouth daily., Disp: , Rfl:   Physical exam:  Vitals:   05/21/20 1432  BP: (!) 151/66  Pulse: (!) 59  Resp: 18  Temp: 97.7 F (36.5 C)  TempSrc: Tympanic  SpO2: 95%  Weight: 229 lb 3.2 oz (104 kg)   Physical Exam Cardiovascular:     Rate and Rhythm: Normal rate and regular rhythm.     Heart sounds: Normal heart sounds.  Pulmonary:     Effort: Pulmonary effort is normal.     Breath sounds: Normal breath sounds.  Abdominal:     General: Bowel  sounds are normal.     Palpations: Abdomen is soft.     Comments: No palpable splenomegaly  Lymphadenopathy:     Comments: No palpable cervical, supraclavicular, axillary or inguinal adenopathy   Skin:    General: Skin is warm and dry.  Neurological:     Mental Status: She is alert and oriented to person, place, and time.      CMP Latest Ref Rng & Units 05/05/2018  Glucose 70 - 99 mg/dL 166(H)  BUN 8 - 23 mg/dL 55(H)  Creatinine 0.44 - 1.00 mg/dL 1.13(H)  Sodium 135 - 145 mmol/L 137  Potassium 3.5 - 5.1 mmol/L 3.9  Chloride 98 - 111 mmol/L 96(L)  CO2 22 - 32 mmol/L 32  Calcium 8.9 - 10.3 mg/dL 8.9  Total Protein 6.5 - 8.1 g/dL -  Total Bilirubin 0.3 - 1.2 mg/dL -  Alkaline Phos 38 - 126 U/L -  AST 15 - 41 U/L -  ALT 0 - 44 U/L -   CBC Latest Ref Rng & Units 05/21/2020  WBC 4.0 - 10.5 K/uL 19.5(H)  Hemoglobin 12.0 - 15.0 g/dL 13.5  Hematocrit 36.0 -  46.0 % 42.6  Platelets 150 - 400 K/uL 160    No images are attached to the encounter.  CT ABDOMEN PELVIS W CONTRAST  Result Date: 05/05/2020 CLINICAL DATA:  Lymphocytosis and back pain with sciatica. EXAM: CT ABDOMEN AND PELVIS WITH CONTRAST TECHNIQUE: Multidetector CT imaging of the abdomen and pelvis was performed using the standard protocol following bolus administration of intravenous contrast. CONTRAST:  155m OMNIPAQUE IOHEXOL 300 MG/ML  SOLN COMPARISON:  CT chest 05/04/2018, CT abdomen pelvis 12/20/2003 FINDINGS: Lower chest: Trace pericardial effusion. Elevated left hemidiaphragm. No acute abnormality. Hepatobiliary: Several fluid density lesions within the liver likely represent simple hepatic cyst. Subcentimeter hypodensities are too small to characterize. No gallstones, gallbladder wall thickening, or pericholecystic fluid. No biliary dilatation. Pancreas: No focal lesion. Normal pancreatic contour. No surrounding inflammatory changes. No main pancreatic ductal dilatation. Spleen: The spleen is enlarged measuring up to 14  cm. No focal abnormality. Adrenals/Urinary Tract: No adrenal nodule bilaterally. Bilateral kidneys enhance symmetrically. No hydronephrosis. No hydroureter. The urinary bladder is unremarkable. Stomach/Bowel: PO contrast reaches the large bowel. Stomach is within normal limits. No evidence of bowel wall thickening or dilatation. Scattered colonic diverticulosis. Appendix appears normal. Vascular/Lymphatic: No abdominal aorta or iliac aneurysm. Mild to moderate atherosclerotic plaque of the aorta and its branches. No abdominal, pelvic, or inguinal lymphadenopathy. Reproductive: The uterus and bilateral adnexal regions are unremarkable. There is slight arm evaluation due to streak artifact originating from bilateral femoral surgical hardware. Other: No intraperitoneal free fluid. No intraperitoneal free gas. No organized fluid collection. Musculoskeletal: No abdominal wall hernia or abnormality. No suspicious lytic or blastic osseous lesions. No acute displaced fracture. L3 vertebral body hemangioma. Multilevel degenerative changes of the spine. 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 IMPRESSION: 1. Mild splenomegaly. 2. Multiple fluid density lesions within the liver that likely represent simple hepatic cysts. Subcentimeter hypodensities are too small to characterize. 3. Colonic diverticulosis with no acute diverticulitis. 4.  Aortic Atherosclerosis (ICD10-I70.0). Electronically Signed   By: MIven FinnM.D.   On: 05/05/2020 17:44     Assessment and plan- Patient is a 87y.o. female referred for persistent leukocytosis/lymphocytosis concerning for a B-cell lymphoproliferative disorder  Patient has had a gradual increase in leukocytosis with a white count has increased up to 29 during her last visit.  Differential mainly showed lymphocytosis with an absolute lymphocyte count of 20 with some monocytosis and basophilia.  Peripheral flow cytometry shows a CD5 negative CD10 negative and CD  103 - B-cell lymphoproliferative disorder.  However the type of lymphoproliferative disorder could not be categorized on flow cytometry.  Clinically I do not palpate any adenopathy.  However given the rising white count I would like to get a PET CT scan to see if there are any targetable areas for biopsy.  If PET scan does not show any significant adenopathy I will consider a bone marrow biopsy to get a definitive diagnosis.  CT abdomen pelvis with contrast done recently did not show any evidence of intra-abdominal adenopathy.  Follow-up with me to be decided after PET scan results are back   Visit Diagnosis 1. 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 ARanda Evens MD, MPH [id number]/13/2022 11:22 AM

## 2020-05-22 NOTE — Telephone Encounter (Signed)
Spoke with patient to make her aware of PET scan scheduled. Patient agreeable with date and time. Sending updated AVS in mail.

## 2020-05-30 ENCOUNTER — Other Ambulatory Visit: Payer: Self-pay

## 2020-05-30 ENCOUNTER — Ambulatory Visit
Admission: RE | Admit: 2020-05-30 | Discharge: 2020-05-30 | Disposition: A | Discharge status: Home / Self Care | Payer: Medicare HMO | Source: Ambulatory Visit | Attending: Oncology | Admitting: Oncology

## 2020-05-30 DIAGNOSIS — I719 Aortic aneurysm of unspecified site, without rupture: Secondary | ICD-10-CM | POA: Insufficient documentation

## 2020-05-30 DIAGNOSIS — D47Z9 Other specified neoplasms of uncertain behavior of lymphoid, hematopoietic and related tissue: Secondary | ICD-10-CM | POA: Insufficient documentation

## 2020-05-30 DIAGNOSIS — K5730 Diverticulosis of large intestine without perforation or abscess without bleeding: Secondary | ICD-10-CM | POA: Insufficient documentation

## 2020-05-30 LAB — GLUCOSE, CAPILLARY: Glucose-Capillary: 97 mg/dL (ref 70–99)

## 2020-05-30 MED ORDER — FLUDEOXYGLUCOSE F - 18 (FDG) INJECTION
11.9000 | Freq: Once | INTRAVENOUS | Status: AC | PRN
  Administered 2020-05-30: 12.54 via INTRAVENOUS

## 2020-06-03 ENCOUNTER — Telehealth: Payer: Self-pay | Admitting: Oncology

## 2020-06-03 NOTE — Telephone Encounter (Signed)
Left message with patient to notify her of appt made on 5/9. Sending updated AVS.

## 2020-06-15 ENCOUNTER — Other Ambulatory Visit: Payer: Self-pay | Admitting: *Deleted

## 2020-06-15 DIAGNOSIS — D47Z9 Other specified neoplasms of uncertain behavior of lymphoid, hematopoietic and related tissue: Secondary | ICD-10-CM

## 2020-06-17 ENCOUNTER — Encounter: Payer: Self-pay | Admitting: Oncology

## 2020-06-17 ENCOUNTER — Inpatient Hospital Stay: Payer: Medicare HMO | Admitting: Oncology

## 2020-06-17 ENCOUNTER — Inpatient Hospital Stay: Payer: Medicare HMO | Attending: Oncology

## 2020-06-17 VITALS — BP 178/57 | HR 58 | Temp 97.8°F | Resp 16 | Ht 66.0 in | Wt 229.7 lb

## 2020-06-17 DIAGNOSIS — C9110 Chronic lymphocytic leukemia of B-cell type not having achieved remission: Secondary | ICD-10-CM | POA: Diagnosis not present

## 2020-06-17 DIAGNOSIS — D47Z9 Other specified neoplasms of uncertain behavior of lymphoid, hematopoietic and related tissue: Secondary | ICD-10-CM

## 2020-06-17 DIAGNOSIS — D729 Disorder of white blood cells, unspecified: Secondary | ICD-10-CM

## 2020-06-17 DIAGNOSIS — D72820 Lymphocytosis (symptomatic): Secondary | ICD-10-CM

## 2020-06-17 LAB — CBC WITH DIFFERENTIAL/PLATELET
Abs Immature Granulocytes: 0.05 10*3/uL (ref 0.00–0.07)
Basophils Absolute: 0.1 10*3/uL (ref 0.0–0.1)
Basophils Relative: 1 %
Eosinophils Absolute: 0.2 10*3/uL (ref 0.0–0.5)
Eosinophils Relative: 1 %
HCT: 43.2 % (ref 36.0–46.0)
Hemoglobin: 13.9 g/dL (ref 12.0–15.0)
Immature Granulocytes: 0 %
Lymphocytes Relative: 60 %
Lymphs Abs: 12.2 10*3/uL — ABNORMAL HIGH (ref 0.7–4.0)
MCH: 28.8 pg (ref 26.0–34.0)
MCHC: 32.2 g/dL (ref 30.0–36.0)
MCV: 89.4 fL (ref 80.0–100.0)
Monocytes Absolute: 3.1 10*3/uL — ABNORMAL HIGH (ref 0.1–1.0)
Monocytes Relative: 16 %
Neutro Abs: 4.4 10*3/uL (ref 1.7–7.7)
Neutrophils Relative %: 22 %
Platelets: 152 10*3/uL (ref 150–400)
RBC: 4.83 MIL/uL (ref 3.87–5.11)
RDW: 14.9 % (ref 11.5–15.5)
Smear Review: NORMAL
WBC Morphology: ABNORMAL
WBC: 20.1 10*3/uL — ABNORMAL HIGH (ref 4.0–10.5)
nRBC: 0 % (ref 0.0–0.2)

## 2020-06-17 NOTE — Progress Notes (Signed)
Pt is here to get scan results. Pt has dizziness couple of weeks ago and it lasted for 20 min. She has fallen in last 3 months x 2. She states that she got twisted up in chair, and the other time she did not know how it happened. She says that she has sob and was given stage I COPD. She wants to know if these things relate to her dx that she has from [facility]

## 2020-06-17 NOTE — Progress Notes (Signed)
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 Most recently her CBC showed white count of 26.4, H&H of 14.8/46.1 and platelet count of 159. Differential mainly showed lymphocytosis with some basophilia and monocytosis. Patient has had chronic leukocytosis/lymphocytosis at least dating back to 2017 but since this year her white count has been in the 20s. Hemoglobin and platelets have always been normal. Differential in the past also has shown mainly lymphocytosis and some monocytosis. Patient presently complains of pain in her right paraspinal area. Reports that the pain worsens when she moves around or bends. She had a CT abdomen and pelvis with contrast done a couple of days ago which showed liver cysts and mild splenomegaly of 14 cm. No intra-abdominal adenopathy no other acute intra-abdominal pathology.Patient reports that her appetite and weight have remained stable.   Results of blood work from 05/06/2020 were as follows: CBC showed white cell count of 29.3, H&H of 14.5/45.4 with a platelet count of 162.  Differential mainly showed lymphocytosis with an absolute lymphocyte count of 20 with some monocytosis and basophilia.  BCR ABL FISH testing was negative.  Smear review showed monomorphic  lymphoid cells which were large with abundant cytoplasm.  Morphology indicative of a lymphoid neoplasm.  Differentiations include CLL with variant morphology, leukemic phase of lymphoma such as mantle cell lymphoma or prolymphocytic leukemia.  Flow cytometry showed CD5 negative CD10 negative and CD 103 - B-cell lymphoproliferative disorder.  Interval history-patient is here with her niece today.  She reports occasional fatigue.  She has had a couple of falls in the last 3 weeks.  Had 1 episode of self-limited nosebleed.  Denies any pain  ECOG PS- 1 Pain scale- 0   Review of systems- Review of Systems  Constitutional: Negative for chills, fever, malaise/fatigue and weight loss.  HENT: Negative for congestion, ear discharge and nosebleeds.   Eyes: Negative for blurred vision.  Respiratory: Negative for cough, hemoptysis, sputum production, shortness of breath and wheezing.   Cardiovascular: Negative for chest pain, palpitations, orthopnea and claudication.  Gastrointestinal: Negative for abdominal pain, blood in stool, constipation, diarrhea, heartburn, melena, nausea and vomiting.  Genitourinary: Negative for dysuria, flank pain, frequency, hematuria and urgency.  Musculoskeletal: Negative for back pain, joint pain and myalgias.  Skin: Negative for rash.  Neurological: Positive for dizziness. Negative for tingling, focal weakness, seizures, weakness and headaches.  Endo/Heme/Allergies: Does not bruise/bleed easily.  Psychiatric/Behavioral: Negative for depression and suicidal ideas. The patient does not have insomnia.       Allergies  Allergen Reactions  . Sulfa Antibiotics Nausea And Vomiting and Rash     Past Medical History:  Diagnosis Date  . Atypical mole 07/07/2017   right upper back/severe. excision 07/07/2017. AMP  . CHF (congestive heart failure) ([place])   . COPD (chronic obstructive pulmonary disease) ([place])   . Hyperlipemia   . Hypertension   . Low grade B cell  lymphoproliferative disorder ([place])   . Urge incontinence      Past Surgical History:  Procedure Laterality Date  . BREAST CYST EXCISION    . CATARACT EXTRACTION    . hip replacment    . REPLACEMENT TOTAL KNEE      Social History   Socioeconomic History  . Marital status: Widowed    Spouse name: Not on file  . Number of children: Not on file  . Years of education: Not on file  . Highest education level: Not on file  Occupational History  . Not on file  Tobacco Use  . Smoking status: Never Smoker  . Smokeless tobacco: Never Used  Vaping Use  . Vaping Use: Never used  Substance and Sexual Activity  . Alcohol use: Never  . Drug use: Never  . Sexual activity: Not on file  Other Topics Concern  . Not on file  Social History Narrative  . Not on file   Social Determinants of Health   Financial Resource Strain: Not on file  Food Insecurity: Not on file  Transportation Needs: Not on file  Physical Activity: Not on file  Stress: Not on file  Social Connections: Not on file  Intimate Partner Violence: Not on file    Family History  Problem Relation Age of Onset  . Hypertension Mother   . Colon cancer Mother   . Breast cancer Mother   . Aortic aneurysm Father   . Pancreatic cancer Brother   . Colon cancer Brother   . Heart disease Brother      Current Outpatient Medications:  .  albuterol (PROVENTIL) (2.5 MG/3ML) 0.083% nebulizer solution, Take 2.5 mg by nebulization every 6 (six) hours as needed for wheezing or shortness of breath., Disp: , Rfl:  .  carvedilol (COREG) 25 MG tablet, Take 25 mg by mouth 2 (two) times daily with a meal., Disp: , Rfl:  .  Cranberry 500 MG CAPS, Take 1 tablet by mouth daily. , Disp: , Rfl:  .  pravastatin (PRAVACHOL) 40 MG tablet, Take 40 mg by mouth daily., Disp: , Rfl:  .  furosemide (LASIX) 40 MG tablet, Take 1 tablet (40 mg total) by mouth daily. (Patient taking differently: Take 40 mg by mouth. Every other week), Disp: 30 tablet,  Rfl: 0  Physical exam:  Vitals:   06/17/20 1319  BP: (!) 178/57  Pulse: (!) 58  Resp: 16  Temp: 97.8 F (36.6 C)  TempSrc: Oral  Weight: 229 lb 11.2 oz (104.2 kg)  Height: 5' 6"  (1.676 m)   Physical Exam Constitutional:      General: She is not in acute distress. Cardiovascular:     Rate and Rhythm: Normal rate and regular rhythm.     Heart sounds: Normal heart sounds.  Pulmonary:     Effort: Pulmonary effort is normal.     Breath sounds: Normal breath sounds.  Abdominal:     General: Bowel sounds are normal.     Palpations: Abdomen is soft.  Skin:    General: Skin is warm and dry.  Neurological:     Mental Status: She is alert and oriented to person, place, and time.      CMP Latest Ref Rng & Units 05/05/2018  Glucose 70 - 99 mg/dL 166(H)  BUN 8 - 23 mg/dL 55(H)  Creatinine 0.44 - 1.00 mg/dL 1.13(H)  Sodium 135 - 145 mmol/L 137  Potassium 3.5 - 5.1 mmol/L 3.9  Chloride 98 - 111 mmol/L 96(L)  CO2  22 - 32 mmol/L 32  Calcium 8.9 - 10.3 mg/dL 8.9  Total Protein 6.5 - 8.1 g/dL -  Total Bilirubin 0.3 - 1.2 mg/dL -  Alkaline Phos 38 - 126 U/L -  AST 15 - 41 U/L -  ALT 0 - 44 U/L -   CBC Latest Ref Rng & Units 06/17/2020  WBC 4.0 - 10.5 K/uL 20.1(H)  Hemoglobin 12.0 - 15.0 g/dL 13.9  Hematocrit 36.0 - 46.0 % 43.2  Platelets 150 - 400 K/uL 152    No images are attached to the encounter.  NM PET Image Initial (PI) Skull Base To Thigh  Result Date: 05/31/2020 CLINICAL DATA:  Initial treatment strategy for B-cell lymphoma. EXAM: NUCLEAR MEDICINE PET SKULL BASE TO THIGH TECHNIQUE: 12.5 mCi F-18 FDG was injected intravenously. Full-ring PET imaging was performed from the skull base to thigh after the radiotracer. CT data was obtained and used for attenuation correction and anatomic localization. Fasting blood glucose: 97 mg/dl COMPARISON:  Chest abdomen pelvis CT 05/03/2020 FINDINGS: Mediastinal blood pool activity: SUV max 3.2 Liver activity: SUV max 4.0 NECK: Scattered  uptake identified in the posterior nasopharynx and oropharynx, nonspecific. No underlying mass lesion on CT imaging. Incidental CT findings: none CHEST: No hypermetabolic mediastinal or hilar nodes. No suspicious pulmonary nodules on the CT scan. Incidental CT findings: Ascending thoracic aorta measures 4.2 cm diameter. Coronary artery calcification is evident. Atherosclerotic calcification is noted in the wall of the thoracic aorta. Heart is enlarged. Trace pericardial effusion evident. ABDOMEN/PELVIS: No abnormal hypermetabolic activity within the liver, pancreas, or adrenal glands. Spleen measures 14 cm craniocaudal length, upper normal. Diffuse uptake of FDG in the splenic parenchyma is minimally above background liver activity. No hypermetabolic lymph nodes in the abdomen or pelvis. Incidental CT findings: Multiple low-density lesions in the liver or better characterized on recent abdomen and pelvis CT with contrast and remain compatible with cyst. There is abdominal aortic atherosclerosis without aneurysm. Diverticular disease noted left colon without diverticulitis. SKELETON: No focal hypermetabolic activity to suggest skeletal metastasis. Incidental CT findings: Status post bilateral hip replacement IMPRESSION: 1. No evidence for hypermetabolic lymphadenopathy in the neck, chest, abdomen, or pelvis. No unexpected or suspicious hypermetabolic lesions on today's study. 2. 4.2 cm diameter ascending thoracic aorta. Recommend annual imaging followup by CTA or MRA. This recommendation follows 2010 ACCF/AHA/AATS/ACR/ASA/SCA/SCAI/SIR/STS/SVM Guidelines for the Diagnosis and Management of Patients with Thoracic Aortic Disease. Circulation. 2010; 121: H885-O277. Aortic aneurysm NOS (ICD10-I71.9) 3. Left colonic diverticulosis without diverticulitis 4.  Aortic Atherosclerois (ICD10-170.0) Electronically Signed   By: Misty Stanley M.D.   On: 05/31/2020 09:34     Assessment and plan- Patient is a 87 y.o. female  Referred for new cytosis/lymphocytosis with flow cytometry consistent with a B-cell lymphoproliferative disorder.  She is here to discuss PET CT scan results and further management  PET CT scan did not show any evidence of significant adenopathy.  Upper limit of normal spleen size 14 cm.  She was also noted to have an ascending thoracic aortic aneurysm 4.2 cm for which she could be potentially referred to vascular surgery and I have discussed this with the patient and her niece.  They would like to hold off at this time.  Discussed the need for at least yearly surveillance which they are agreeable to.  With regards to the B-cell lymphoproliferative disorder flow cytometry showed a CD5 negative CD10 negative and CD103 negative B-cell lymphoproliferative disorder but a phenotype would not be assigned.  No peripheral site to biopsy  and PET scan was essentially unremarkable for any lymphadenopathy.  I am inclined to monitor her leukocytosis/lymphocytosis conservatively at this time and if there is a consistent increase in her white cell count I will consider a bone marrow biopsy at that time.  Repeat CBC with differential in 2 months in 4 months and I will see her back in 4 months   Visit Diagnosis 1. B-cell abnormality   2. Lymphocytosis      Dr. Randa Evens, MD, MPH [facility] at [facility] [phone] 06/17/2020 4:08 PM

## 2020-06-18 ENCOUNTER — Other Ambulatory Visit: Payer: Self-pay | Admitting: *Deleted

## 2020-06-18 DIAGNOSIS — D72820 Lymphocytosis (symptomatic): Secondary | ICD-10-CM

## 2020-06-18 DIAGNOSIS — D729 Disorder of white blood cells, unspecified: Secondary | ICD-10-CM

## 2020-08-15 ENCOUNTER — Inpatient Hospital Stay: Payer: Medicare HMO | Attending: Oncology

## 2020-08-15 DIAGNOSIS — D729 Disorder of white blood cells, unspecified: Secondary | ICD-10-CM

## 2020-08-15 DIAGNOSIS — C9110 Chronic lymphocytic leukemia of B-cell type not having achieved remission: Secondary | ICD-10-CM | POA: Insufficient documentation

## 2020-08-15 DIAGNOSIS — D72820 Lymphocytosis (symptomatic): Secondary | ICD-10-CM

## 2020-08-15 LAB — CBC WITH DIFFERENTIAL/PLATELET
Abs Immature Granulocytes: 0.05 10*3/uL (ref 0.00–0.07)
Basophils Absolute: 0.2 10*3/uL — ABNORMAL HIGH (ref 0.0–0.1)
Basophils Relative: 1 %
Eosinophils Absolute: 0.2 10*3/uL (ref 0.0–0.5)
Eosinophils Relative: 1 %
HCT: 42.9 % (ref 36.0–46.0)
Hemoglobin: 14 g/dL (ref 12.0–15.0)
Immature Granulocytes: 0 %
Lymphocytes Relative: 73 %
Lymphs Abs: 18 10*3/uL — ABNORMAL HIGH (ref 0.7–4.0)
MCH: 29.2 pg (ref 26.0–34.0)
MCHC: 32.6 g/dL (ref 30.0–36.0)
MCV: 89.6 fL (ref 80.0–100.0)
Monocytes Absolute: 1.8 10*3/uL — ABNORMAL HIGH (ref 0.1–1.0)
Monocytes Relative: 8 %
Neutro Abs: 4.1 10*3/uL (ref 1.7–7.7)
Neutrophils Relative %: 17 %
Platelets: 146 10*3/uL — ABNORMAL LOW (ref 150–400)
RBC: 4.79 MIL/uL (ref 3.87–5.11)
RDW: 14.7 % (ref 11.5–15.5)
Smear Review: NORMAL
WBC Morphology: ABNORMAL
WBC: 24.3 10*3/uL — ABNORMAL HIGH (ref 4.0–10.5)
nRBC: 0 % (ref 0.0–0.2)

## 2020-10-21 ENCOUNTER — Inpatient Hospital Stay: Payer: Medicare HMO | Attending: Oncology

## 2020-10-21 ENCOUNTER — Encounter: Payer: Self-pay | Admitting: Oncology

## 2020-10-21 ENCOUNTER — Other Ambulatory Visit: Payer: Self-pay

## 2020-10-21 ENCOUNTER — Inpatient Hospital Stay: Payer: Medicare HMO | Admitting: Oncology

## 2020-10-21 VITALS — BP 153/63 | HR 52 | Temp 96.6°F | Resp 18 | Wt 259.9 lb

## 2020-10-21 DIAGNOSIS — C9110 Chronic lymphocytic leukemia of B-cell type not having achieved remission: Secondary | ICD-10-CM | POA: Diagnosis not present

## 2020-10-21 DIAGNOSIS — I110 Hypertensive heart disease with heart failure: Secondary | ICD-10-CM | POA: Diagnosis not present

## 2020-10-21 DIAGNOSIS — D72820 Lymphocytosis (symptomatic): Secondary | ICD-10-CM

## 2020-10-21 DIAGNOSIS — R161 Splenomegaly, not elsewhere classified: Secondary | ICD-10-CM

## 2020-10-21 DIAGNOSIS — E785 Hyperlipidemia, unspecified: Secondary | ICD-10-CM | POA: Diagnosis not present

## 2020-10-21 DIAGNOSIS — D729 Disorder of white blood cells, unspecified: Secondary | ICD-10-CM

## 2020-10-21 LAB — CBC WITH DIFFERENTIAL/PLATELET
Abs Immature Granulocytes: 0.04 10*3/uL (ref 0.00–0.07)
Basophils Absolute: 0.1 10*3/uL (ref 0.0–0.1)
Basophils Relative: 1 %
Eosinophils Absolute: 0.2 10*3/uL (ref 0.0–0.5)
Eosinophils Relative: 1 %
HCT: 43.4 % (ref 36.0–46.0)
Hemoglobin: 13.9 g/dL (ref 12.0–15.0)
Immature Granulocytes: 0 %
Lymphocytes Relative: 63 %
Lymphs Abs: 14 10*3/uL — ABNORMAL HIGH (ref 0.7–4.0)
MCH: 29 pg (ref 26.0–34.0)
MCHC: 32 g/dL (ref 30.0–36.0)
MCV: 90.4 fL (ref 80.0–100.0)
Monocytes Absolute: 3.6 10*3/uL — ABNORMAL HIGH (ref 0.1–1.0)
Monocytes Relative: 16 %
Neutro Abs: 4.1 10*3/uL (ref 1.7–7.7)
Neutrophils Relative %: 19 %
Platelets: 147 10*3/uL — ABNORMAL LOW (ref 150–400)
RBC: 4.8 MIL/uL (ref 3.87–5.11)
RDW: 14.8 % (ref 11.5–15.5)
Smear Review: NORMAL
WBC: 22 10*3/uL — ABNORMAL HIGH (ref 4.0–10.5)
nRBC: 0 % (ref 0.0–0.2)

## 2020-10-21 NOTE — Progress Notes (Signed)
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  Most recently her CBC showed white count of 26.4, H&H of 14.8/46.1 and platelet count of 159.  Differential mainly showed lymphocytosis with some basophilia and monocytosis.  Patient has had chronic leukocytosis/lymphocytosis at least dating back to 2017 but since this year her white count has been in the 20s.  Hemoglobin and platelets have always been normal.  Differential in the past also has shown mainly lymphocytosis and some monocytosis.  Patient presently complains of pain in her right paraspinal area.  Reports that the pain worsens when she moves around or bends.  She had a CT abdomen and pelvis with contrast done a couple of days ago which showed liver cysts and mild splenomegaly of 14 cm.  No intra-abdominal adenopathy no other acute intra-abdominal pathology.  Patient reports that her appetite and weight have remained stable.    Results of blood work from 05/06/2020 were as follows: CBC showed white cell count of 29.3, H&H of 14.5/45.4 with a platelet count of 162.  Differential mainly showed lymphocytosis with an absolute lymphocyte count of 20 with some monocytosis and basophilia.  BCR ABL FISH testing was negative.  Smear review showed monomorphic  lymphoid cells which were large with abundant cytoplasm.  Morphology indicative of a lymphoid neoplasm.  Differentiations include CLL with variant morphology, leukemic phase of lymphoma such as mantle cell lymphoma or prolymphocytic leukemia.  Flow cytometry showed CD5 negative CD10 negative and CD 103 - B-cell lymphoproliferative disorder.  Interval history-patient reports doing well her overall appetite has been stable.  Her weight was reported to be 259 pounds in our office today which I am not sure if it is an error because she said that her weight at home is around 225 pounds  ECOG PS- 1 Pain scale- 0   Review of systems- Review of Systems  Constitutional:  Negative for chills, fever, malaise/fatigue and weight loss.  HENT:  Negative for congestion, ear discharge and nosebleeds.   Eyes:  Negative for blurred vision.  Respiratory:  Negative for cough, hemoptysis, sputum production, shortness of breath and wheezing.   Cardiovascular:  Negative for chest pain, palpitations, orthopnea and claudication.  Gastrointestinal:  Negative for abdominal pain, blood in stool, constipation, diarrhea, heartburn, melena, nausea and vomiting.  Genitourinary:  Negative for dysuria, flank pain, frequency, hematuria and urgency.  Musculoskeletal:  Negative for back pain, joint pain and myalgias.  Skin:  Negative for rash.  Neurological:  Negative for dizziness, tingling, focal weakness, seizures, weakness and headaches.  Endo/Heme/Allergies:  Does not bruise/bleed easily.  Psychiatric/Behavioral:  Negative for depression and suicidal ideas. The patient does not have insomnia.      Allergies  Allergen Reactions   Sulfa Antibiotics Nausea And Vomiting and Rash     Past Medical History:  Diagnosis Date   Atypical mole 07/07/2017   right upper back/severe. excision 07/07/2017. 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)    Urge incontinence      Past Surgical History:  Procedure Laterality Date   BREAST CYST EXCISION     CATARACT EXTRACTION     hip replacment     REPLACEMENT TOTAL KNEE      Social History   Socioeconomic History   Marital status: Widowed    Spouse name: Not on file   Number of children: Not on file   Years of education: Not on file   Highest education level: Not on file  Occupational History   Not on file  Tobacco Use   Smoking status: Never   Smokeless tobacco: Never  Vaping Use   Vaping Use: Never used  Substance and Sexual Activity   Alcohol use: Never   Drug use: Never   Sexual activity: Not on file  Other Topics Concern   Not on file  Social History Narrative   Not on file   Social Determinants of Health   Financial Resource Strain: Not on file  Food Insecurity: Not on file  Transportation Needs: Not on file  Physical Activity: Not on file  Stress: Not on file  Social Connections: Not on file  Intimate Partner Violence: Not on file    Family History  Problem Relation Age of Onset   Hypertension Mother    Colon cancer Mother    Breast cancer Mother    Aortic aneurysm Father    Pancreatic cancer Brother    Colon cancer Brother    Heart disease Brother      Current Outpatient Medications:    carvedilol (COREG) 25 MG tablet, Take 25 mg by mouth 2 (two) times daily with a meal., Disp: , Rfl:    Cranberry 500 MG CAPS, Take 1 tablet by mouth daily. , Disp: , Rfl:    mometasone (ELOCON) 0.1 % lotion, SMARTSIG:In Ear(s), Disp: , Rfl:    pravastatin (PRAVACHOL) 40 MG tablet, Take 40 mg by mouth daily., Disp: , Rfl:    spironolactone (ALDACTONE) 25 MG tablet, Take 25 mg by mouth daily., Disp: , Rfl:    albuterol (PROVENTIL) (2.5 MG/3ML) 0.083% nebulizer solution, Take 2.5 mg by nebulization every 6 (six) hours as needed for wheezing or shortness of breath., Disp: , Rfl:    furosemide  (LASIX) 40 MG tablet, Take 1 tablet (40 mg total) by mouth daily. (Patient not taking: Reported on 10/21/2020), Disp: 30 tablet, Rfl: 0  Physical exam:  Vitals:   10/21/20 1305  BP: (!) 153/63  Pulse: (!) 52  Resp: 18  Temp: (!) 96.6 F (35.9 C)  SpO2: 93%  Weight: 259 lb 14.4 oz (117.9 kg)   Physical Exam Constitutional:      General: She is not in acute distress. Cardiovascular:     Rate and Rhythm: Normal rate and regular rhythm.     Heart sounds: Normal heart sounds.  Pulmonary:     Effort: Pulmonary effort is normal.     Breath sounds: Normal breath sounds.  Abdominal:     General: Bowel sounds are normal.     Palpations: Abdomen is soft.     Comments: No palpable hepatosplenomegaly  Musculoskeletal:     Cervical back: Normal range of motion.  Lymphadenopathy:     Comments: No palpable cervical, supraclavicular, axillary or inguinal adenopathy    Skin:  General: Skin is warm and dry.  Neurological:     Mental Status: She is alert and oriented to person, place, and time.     CMP Latest Ref Rng & Units 05/05/2018  Glucose 70 - 99 mg/dL 166(H)  BUN 8 - 23 mg/dL 55(H)  Creatinine 0.44 - 1.00 mg/dL 1.13(H)  Sodium 135 - 145 mmol/L 137  Potassium 3.5 - 5.1 mmol/L 3.9  Chloride 98 - 111 mmol/L 96(L)  CO2 22 - 32 mmol/L 32  Calcium 8.9 - 10.3 mg/dL 8.9  Total Protein 6.5 - 8.1 g/dL -  Total Bilirubin 0.3 - 1.2 mg/dL -  Alkaline Phos 38 - 126 U/L -  AST 15 - 41 U/L -  ALT 0 - 44 U/L -   CBC Latest Ref Rng & Units 10/21/2020  WBC 4.0 - 10.5 K/uL 22.0(H)  Hemoglobin 12.0 - 15.0 g/dL 13.9  Hematocrit 36.0 - 46.0 % 43.4  Platelets 150 - 400 K/uL 147(L)     Assessment and plan- Patient is a 87 y.o. female with evidence of B-cell clonal lymphoproliferative disorder detected on flow cytometry  Patient's white cell count has been stable in the 20s for the last 6 months.  No evidence of significant anemia.  Mild thrombocytopenia.  No B symptoms.  PET scan in April  2022 did not show any significant adenopathy.Mild splenomegaly of 14 cm.  Bone marrow biopsy was deferred.  Given the stability of counts I am inclined to monitor this conservatively with a CBC with differential in 2 4 and 6 months and I will see her back in 6 months with a ultrasound abdomen prior   Visit Diagnosis 1. B-cell abnormality   2. Splenomegaly      Dr. Randa Evens, MD, MPH [facility] [phone] 10/21/2020 3:48 PM

## 2020-12-23 ENCOUNTER — Other Ambulatory Visit: Payer: Self-pay

## 2020-12-23 ENCOUNTER — Inpatient Hospital Stay: Payer: Medicare HMO | Attending: Oncology

## 2020-12-23 DIAGNOSIS — D739 Disease of spleen, unspecified: Secondary | ICD-10-CM | POA: Diagnosis not present

## 2020-12-23 DIAGNOSIS — D47Z9 Other specified neoplasms of uncertain behavior of lymphoid, hematopoietic and related tissue: Secondary | ICD-10-CM | POA: Insufficient documentation

## 2020-12-23 DIAGNOSIS — D729 Disorder of white blood cells, unspecified: Secondary | ICD-10-CM

## 2020-12-23 LAB — COMPREHENSIVE METABOLIC PANEL
ALT: 11 U/L (ref 0–44)
AST: 17 U/L (ref 15–41)
Albumin: 3.7 g/dL (ref 3.5–5.0)
Alkaline Phosphatase: 47 U/L (ref 38–126)
Anion gap: 7 (ref 5–15)
BUN: 16 mg/dL (ref 8–23)
CO2: 28 mmol/L (ref 22–32)
Calcium: 8.6 mg/dL — ABNORMAL LOW (ref 8.9–10.3)
Chloride: 103 mmol/L (ref 98–111)
Creatinine, Ser: 0.93 mg/dL (ref 0.44–1.00)
GFR, Estimated: 59 mL/min — ABNORMAL LOW (ref >60)
Glucose, Bld: 111 mg/dL — ABNORMAL HIGH (ref 70–99)
Potassium: 4.1 mmol/L (ref 3.5–5.1)
Sodium: 138 mmol/L (ref 135–145)
Total Bilirubin: 0.6 mg/dL (ref 0.3–1.2)
Total Protein: 7.3 g/dL (ref 6.5–8.1)

## 2020-12-23 LAB — CBC WITH DIFFERENTIAL/PLATELET
Abs Immature Granulocytes: 0.06 10*3/uL (ref 0.00–0.07)
Basophils Absolute: 0.2 10*3/uL — ABNORMAL HIGH (ref 0.0–0.1)
Basophils Relative: 1 %
Eosinophils Absolute: 0.1 10*3/uL (ref 0.0–0.5)
Eosinophils Relative: 1 %
HCT: 42.7 % (ref 36.0–46.0)
Hemoglobin: 13.8 g/dL (ref 12.0–15.0)
Immature Granulocytes: 0 %
Lymphocytes Relative: 73 %
Lymphs Abs: 19.8 10*3/uL — ABNORMAL HIGH (ref 0.7–4.0)
MCH: 29.6 pg (ref 26.0–34.0)
MCHC: 32.3 g/dL (ref 30.0–36.0)
MCV: 91.6 fL (ref 80.0–100.0)
Monocytes Absolute: 1.8 10*3/uL — ABNORMAL HIGH (ref 0.1–1.0)
Monocytes Relative: 7 %
Neutro Abs: 4.7 10*3/uL (ref 1.7–7.7)
Neutrophils Relative %: 18 %
Platelets: 161 10*3/uL (ref 150–400)
RBC: 4.66 MIL/uL (ref 3.87–5.11)
RDW: 14.9 % (ref 11.5–15.5)
Smear Review: NORMAL
WBC: 26.7 10*3/uL — ABNORMAL HIGH (ref 4.0–10.5)
nRBC: 0 % (ref 0.0–0.2)

## 2020-12-23 LAB — LACTATE DEHYDROGENASE: LDH: 118 U/L (ref 98–192)

## 2020-12-26 ENCOUNTER — Other Ambulatory Visit: Payer: Self-pay

## 2020-12-26 ENCOUNTER — Ambulatory Visit: Payer: Medicare HMO | Admitting: Dermatology

## 2020-12-26 DIAGNOSIS — D1801 Hemangioma of skin and subcutaneous tissue: Secondary | ICD-10-CM

## 2020-12-26 DIAGNOSIS — R21 Rash and other nonspecific skin eruption: Secondary | ICD-10-CM | POA: Diagnosis not present

## 2020-12-26 DIAGNOSIS — D239 Other benign neoplasm of skin, unspecified: Secondary | ICD-10-CM

## 2020-12-26 DIAGNOSIS — Z86018 Personal history of other benign neoplasm: Secondary | ICD-10-CM | POA: Diagnosis not present

## 2020-12-26 DIAGNOSIS — D229 Melanocytic nevi, unspecified: Secondary | ICD-10-CM

## 2020-12-26 DIAGNOSIS — Z1283 Encounter for screening for malignant neoplasm of skin: Secondary | ICD-10-CM

## 2020-12-26 DIAGNOSIS — L821 Other seborrheic keratosis: Secondary | ICD-10-CM

## 2020-12-26 DIAGNOSIS — D235 Other benign neoplasm of skin of trunk: Secondary | ICD-10-CM | POA: Diagnosis not present

## 2020-12-26 DIAGNOSIS — L578 Other skin changes due to chronic exposure to nonionizing radiation: Secondary | ICD-10-CM

## 2020-12-26 DIAGNOSIS — L814 Other melanin hyperpigmentation: Secondary | ICD-10-CM

## 2020-12-26 NOTE — Patient Instructions (Addendum)
For rash at right thigh -   Recommend cetirizine 1-2 times daily Start mometasone twice daily until clear for up to 3 weeks . Avoid applying to face, groin, and axilla. Use as directed. Long-term use can cause thinning of the skin.  Topical steroids (such as triamcinolone, fluocinolone, fluocinonide, mometasone, clobetasol, halobetasol, betamethasone, hydrocortisone) can cause thinning and lightening of the skin if they are used for too long in the same area. Your physician has selected the right strength medicine for your problem and area affected on the body. Please use your medication only as directed by your physician to prevent side effects.   Recommend OTC Gold Bond Rapid Relief Anti-Itch cream (pramoxine + menthol), CeraVe Anti-itch cream or lotion (pramoxine), Sarna lotion (Original- menthol + camphor or Sensitive- pramoxine) or Eucerin 12 hour Itch Relief lotion (menthol) up to 3 times per day to areas on body that are itchy.  Melanoma ABCDEs  Melanoma is the most dangerous type of skin cancer, and is the leading cause of death from skin disease.  You are more likely to develop melanoma if you: Have light-colored skin, light-colored eyes, or red or blond hair Spend a lot of time in the sun Tan regularly, either outdoors or in a tanning bed Have had blistering sunburns, especially during childhood Have a close family member who has had a melanoma Have atypical moles or large birthmarks  Early detection of melanoma is key since treatment is typically straightforward and cure rates are extremely high if we catch it early.   The first sign of melanoma is often a change in a mole or a new dark spot.  The ABCDE system is a way of remembering the signs of melanoma.  A for asymmetry:  The two halves do not match. B for border:  The edges of the growth are irregular. C for color:  A mixture of colors are present instead of an even brown color. D for diameter:  Melanomas are usually (but not  always) greater than 42mm - the size of a pencil eraser. E for evolution:  The spot keeps changing in size, shape, and color.  Please check your skin once per month between visits. You can use a small mirror in front and a large mirror behind you to keep an eye on the back side or your body.   If you see any new or changing lesions before your next follow-up, please call to schedule a visit.  Please continue daily skin protection including broad spectrum sunscreen SPF 30+ to sun-exposed areas, reapplying every 2 hours as needed when you're outdoors.    If You Need Anything After Your Visit  If you have any questions or concerns for your doctor, please call our main line at [phone] and press option 4 to reach your doctor's medical assistant. If no one answers, please leave a voicemail as directed and we will return your call as soon as possible. Messages left after 4 pm will be answered the following business day.   You may also send [place] a message via [place]. We typically respond to MyChart messages within 1-2 business days.  For prescription refills, please ask your pharmacy to contact our office. Our fax number is [phone].  If you have an urgent issue when the clinic is closed that cannot wait until the next business day, you can page your doctor at the number below.    Please note that while we do our best to be available for urgent issues outside of  office hours, we are not available 24/7.   If you have an urgent issue and are unable to reach [place], you may choose to seek medical care at your doctor's office, retail clinic, urgent care center, or emergency room.  If you have a medical emergency, please immediately call 911 or go to the emergency department.  Pager Numbers  - Dr. Nehemiah Massed: [phone]  - Dr. Laurence Ferrari: [phone]  - Dr. Nicole Kindred: [phone]  In the event of inclement weather, please call our main line at [phone] for an update on the status of any delays  or closures.  Dermatology Medication Tips: Please keep the boxes that topical medications come in in order to help keep track of the instructions about where and how to use these. Pharmacies typically print the medication instructions only on the boxes and not directly on the medication tubes.   If your medication is too expensive, please contact our office at [phone] option 4 or send [place] a message through [place].   We are unable to tell what your co-pay for medications will be in advance as this is different depending on your insurance coverage. However, we may be able to find a substitute medication at lower cost or fill out paperwork to get insurance to cover a needed medication.   If a prior authorization is required to get your medication covered by your insurance company, please allow [place] 1-2 business days to complete this process.  Drug prices often vary depending on where the prescription is filled and some pharmacies may offer cheaper prices.  The website www.goodrx.com contains coupons for medications through different pharmacies. The prices here do not account for what the cost may be with help from insurance (it may be cheaper with your insurance), but the website can give you the price if you did not use any insurance.  - You can print the associated coupon and take it with your prescription to the pharmacy.  - You may also stop by our office during regular business hours and pick up a GoodRx coupon card.  - If you need your prescription sent electronically to a different pharmacy, notify our office through [facility] or by phone at [phone] option 4.     Si Usted Necesita Algo Despus de Su Visita  Tambin puede enviarnos un mensaje a travs de Pharmacist, community. Por lo general respondemos a los mensajes de MyChart en el transcurso de 1 a 2 das hbiles.  Para renovar recetas, por favor pida a su farmacia que se ponga en contacto con nuestra oficina. Harland Dingwall de  fax es [place] [phone].  Si tiene un asunto urgente cuando la clnica est cerrada y que no puede esperar hasta el siguiente da hbil, puede llamar/localizar a su doctor(a) al nmero que aparece a continuacin.   Por favor, tenga en cuenta que aunque hacemos todo lo posible para estar disponibles para asuntos urgentes fuera del horario de [place], no estamos disponibles las 24 horas del da, los 7 das de la [place].   Si tiene un problema urgente y no puede comunicarse con nosotros, puede optar por buscar atencin mdica  en el consultorio de su doctor(a), en una clnica privada, en un centro de atencin urgente o en una sala de emergencias.  Si tiene Engineering geologist, por favor llame inmediatamente al 911 o vaya a la sala de emergencias.  Nmeros de bper  - Dr. Nehemiah Massed: [phone]  - Dra. Moye: [phone]  - Dra. Nicole Kindred: [phone]  En caso de inclemencias  del [place], por favor llame a Cleotis Nipper lnea principal al [phone] para una actualizacin sobre el estado de cualquier retraso o cierre.  Consejos para la medicacin en dermatologa: Por favor, guarde las cajas en las que vienen los medicamentos de uso tpico para ayudarle a seguir las instrucciones sobre dnde y cmo usarlos. Las farmacias generalmente imprimen las instrucciones del medicamento slo en las cajas y no directamente en los tubos del [place].   Si su medicamento es muy caro, por favor, pngase en contacto con Zigmund Daniel llamando al [phone] y presione la opcin 4 o envenos un mensaje a travs de Pharmacist, community.   No podemos decirle cul ser su copago por los medicamentos por adelantado ya que esto es diferente dependiendo de la cobertura de su seguro. Sin embargo, es posible que podamos encontrar un medicamento sustituto a Electrical engineer un formulario para que el seguro cubra el medicamento que se considera necesario.   Si se requiere una autorizacin previa para que su compaa de seguros  [place] su medicamento, por favor permtanos de 1 a 2 das hbiles para completar este proceso.  Los precios de los medicamentos varan con frecuencia dependiendo del Environmental consultant de dnde se surte la receta y alguna farmacias pueden ofrecer precios ms baratos.  El sitio web www.goodrx.com tiene cupones para medicamentos de Airline pilot. Los precios aqu no tienen en cuenta lo que podra costar con la ayuda del seguro (puede ser ms barato con su seguro), pero el sitio web puede darle el precio si no utiliz Research scientist (physical sciences).  - Puede imprimir el cupn correspondiente y llevarlo con su receta a la farmacia.  - Tambin puede pasar por nuestra oficina durante el horario de atencin regular y Charity fundraiser una tarjeta de cupones de GoodRx.  - Si necesita que su receta se enve electrnicamente a una farmacia diferente, informe a nuestra oficina a travs de MyChart de [place] o por telfono llamando al [phone] y presione la opcin 4.

## 2020-12-26 NOTE — Progress Notes (Signed)
Follow-Up Visit   Subjective  Lori Browning is a 87 y.o. female who presents for the following: FBSE (Patient here for full body skin exam and skin cancer screening. Patient with hx of dysplastic nevi. She is not aware of any new or changing spots. ).  Patient does have an area at right thigh that has been itchy for about 2 days.   The following portions of the chart were reviewed this encounter and updated as appropriate:   Tobacco  Allergies  Meds  Problems  Med Hx  Surg Hx  Fam Hx      Review of Systems:  No other skin or systemic complaints except as noted in HPI or Assessment and Plan.  Objective  Well appearing patient in no apparent distress; mood and affect are within normal limits.  A full examination was performed including scalp, head, eyes, ears, nose, lips, neck, chest, axillae, abdomen, back, buttocks, bilateral upper extremities, bilateral lower extremities, hands, feet, fingers, toes, fingernails, and toenails. All findings within normal limits unless otherwise noted below.  Right Thigh Edematous pink plaque with surrounding dermatographism.   Left Thigh, left upper back Firm pink/brown papulenodule with dimple sign.    Assessment & Plan  Rash Right Thigh  Recommend cetirizine 1-2 times daily Start mometasone twice daily until clear for up to 3 weeks . Avoid applying to face, groin, and axilla. Use as directed. Long-term use can cause thinning of the skin.  Topical steroids (such as triamcinolone, fluocinolone, fluocinonide, mometasone, clobetasol, halobetasol, betamethasone, hydrocortisone) can cause thinning and lightening of the skin if they are used for too long in the same area. Your physician has selected the right strength medicine for your problem and area affected on the body. Please use your medication only as directed by your physician to prevent side effects.    Dermatofibroma Left Thigh, left upper back  - Benign appearing - Call for any  changes   Lentigines - Scattered tan macules - Due to sun exposure - Benign-appearing, observe - Recommend daily broad spectrum sunscreen SPF 30+ to sun-exposed areas, reapply every 2 hours as needed. - Call for any changes  Seborrheic Keratoses - Stuck-on, waxy, tan-brown papules and/or plaques  - Benign-appearing - Discussed benign etiology and prognosis. - Observe - Call for any changes  Melanocytic Nevi - Tan-brown and/or pink-flesh-colored symmetric macules and papules - Benign appearing on exam today - Observation - Call clinic for new or changing moles - Recommend daily use of broad spectrum spf 30+ sunscreen to sun-exposed areas.   Hemangiomas - Red papules - Discussed benign nature - Observe - Call for any changes  Actinic Damage - Chronic condition, secondary to cumulative UV/sun exposure - diffuse scaly erythematous macules with underlying dyspigmentation - Recommend daily broad spectrum sunscreen SPF 30+ to sun-exposed areas, reapply every 2 hours as needed.  - Staying in the shade or wearing long sleeves, sun glasses (UVA+UVB protection) and wide brim hats (4-inch brim around the entire circumference of the hat) are also recommended for sun protection.  - Call for new or changing lesions.  Skin cancer screening performed today.  History of Dysplastic Nevi - No evidence of recurrence today at right upper back - Recommend regular full body skin exams - Recommend daily broad spectrum sunscreen SPF 30+ to sun-exposed areas, reapply every 2 hours as needed.  - Call if any new or changing lesions are noted between office visits  Return in about 1 year (around 12/26/2021) for TBSE.  I,Jackie  Minette Brine, RMA, am acting as scribe for Forest Gleason, MD .  Documentation: I have reviewed the above documentation for accuracy and completeness, and I agree with the above.  Forest Gleason, MD

## 2021-01-07 ENCOUNTER — Encounter: Payer: Self-pay | Admitting: Dermatology

## 2021-02-21 ENCOUNTER — Other Ambulatory Visit: Payer: Self-pay

## 2021-02-21 ENCOUNTER — Inpatient Hospital Stay: Payer: Medicare HMO | Attending: Oncology

## 2021-02-21 DIAGNOSIS — D47Z9 Other specified neoplasms of uncertain behavior of lymphoid, hematopoietic and related tissue: Secondary | ICD-10-CM | POA: Diagnosis present

## 2021-02-21 DIAGNOSIS — D729 Disorder of white blood cells, unspecified: Secondary | ICD-10-CM

## 2021-02-21 LAB — COMPREHENSIVE METABOLIC PANEL
ALT: 12 U/L (ref 0–44)
AST: 19 U/L (ref 15–41)
Albumin: 4 g/dL (ref 3.5–5.0)
Alkaline Phosphatase: 48 U/L (ref 38–126)
Anion gap: 5 (ref 5–15)
BUN: 19 mg/dL (ref 8–23)
CO2: 28 mmol/L (ref 22–32)
Calcium: 8.7 mg/dL — ABNORMAL LOW (ref 8.9–10.3)
Chloride: 102 mmol/L (ref 98–111)
Creatinine, Ser: 0.91 mg/dL (ref 0.44–1.00)
GFR, Estimated: >60 mL/min (ref >60)
Glucose, Bld: 94 mg/dL (ref 70–99)
Potassium: 4.4 mmol/L (ref 3.5–5.1)
Sodium: 135 mmol/L (ref 135–145)
Total Bilirubin: 0.9 mg/dL (ref 0.3–1.2)
Total Protein: 7.1 g/dL (ref 6.5–8.1)

## 2021-02-21 LAB — CBC WITH DIFFERENTIAL/PLATELET
Abs Immature Granulocytes: 0.06 10*3/uL (ref 0.00–0.07)
Basophils Absolute: 0.2 10*3/uL — ABNORMAL HIGH (ref 0.0–0.1)
Basophils Relative: 1 %
Eosinophils Absolute: 0.1 10*3/uL (ref 0.0–0.5)
Eosinophils Relative: 1 %
HCT: 45.7 % (ref 36.0–46.0)
Hemoglobin: 14.6 g/dL (ref 12.0–15.0)
Immature Granulocytes: 0 %
Lymphocytes Relative: 73 %
Lymphs Abs: 17.7 10*3/uL — ABNORMAL HIGH (ref 0.7–4.0)
MCH: 29.6 pg (ref 26.0–34.0)
MCHC: 31.9 g/dL (ref 30.0–36.0)
MCV: 92.7 fL (ref 80.0–100.0)
Monocytes Absolute: 2.1 10*3/uL — ABNORMAL HIGH (ref 0.1–1.0)
Monocytes Relative: 9 %
Neutro Abs: 3.9 10*3/uL (ref 1.7–7.7)
Neutrophils Relative %: 16 %
Platelets: 152 10*3/uL (ref 150–400)
RBC: 4.93 MIL/uL (ref 3.87–5.11)
RDW: 14.3 % (ref 11.5–15.5)
Smear Review: NORMAL
WBC Morphology: ABNORMAL
WBC: 24 10*3/uL — ABNORMAL HIGH (ref 4.0–10.5)
nRBC: 0 % (ref 0.0–0.2)

## 2021-02-21 LAB — LACTATE DEHYDROGENASE: LDH: 113 U/L (ref 98–192)

## 2021-04-10 ENCOUNTER — Ambulatory Visit
Admission: RE | Admit: 2021-04-10 | Discharge: 2021-04-10 | Disposition: A | Discharge status: Home / Self Care | Payer: Medicare HMO | Source: Ambulatory Visit | Attending: Oncology | Admitting: Oncology

## 2021-04-10 ENCOUNTER — Other Ambulatory Visit: Payer: Self-pay

## 2021-04-10 DIAGNOSIS — R161 Splenomegaly, not elsewhere classified: Secondary | ICD-10-CM | POA: Diagnosis present

## 2021-04-21 ENCOUNTER — Inpatient Hospital Stay: Payer: Medicare HMO | Attending: Oncology

## 2021-04-21 ENCOUNTER — Other Ambulatory Visit: Payer: Self-pay

## 2021-04-21 ENCOUNTER — Inpatient Hospital Stay: Payer: Medicare HMO | Admitting: Oncology

## 2021-04-21 VITALS — BP 195/113 | HR 63 | Temp 97.7°F | Wt 229.9 lb

## 2021-04-21 DIAGNOSIS — D729 Disorder of white blood cells, unspecified: Secondary | ICD-10-CM

## 2021-04-21 DIAGNOSIS — R161 Splenomegaly, not elsewhere classified: Secondary | ICD-10-CM

## 2021-04-21 DIAGNOSIS — Z800 Family history of malignant neoplasm of digestive organs: Secondary | ICD-10-CM | POA: Diagnosis not present

## 2021-04-21 DIAGNOSIS — Z803 Family history of malignant neoplasm of breast: Secondary | ICD-10-CM | POA: Insufficient documentation

## 2021-04-21 DIAGNOSIS — D47Z9 Other specified neoplasms of uncertain behavior of lymphoid, hematopoietic and related tissue: Secondary | ICD-10-CM | POA: Diagnosis present

## 2021-04-21 LAB — CBC WITH DIFFERENTIAL/PLATELET
Abs Immature Granulocytes: 0.07 10*3/uL (ref 0.00–0.07)
Basophils Absolute: 0.3 10*3/uL — ABNORMAL HIGH (ref 0.0–0.1)
Basophils Relative: 1 %
Eosinophils Absolute: 0.1 10*3/uL (ref 0.0–0.5)
Eosinophils Relative: 0 %
HCT: 44.1 % (ref 36.0–46.0)
Hemoglobin: 14.2 g/dL (ref 12.0–15.0)
Immature Granulocytes: 0 %
Lymphocytes Relative: 80 %
Lymphs Abs: 25.3 10*3/uL — ABNORMAL HIGH (ref 0.7–4.0)
MCH: 29.3 pg (ref 26.0–34.0)
MCHC: 32.2 g/dL (ref 30.0–36.0)
MCV: 91.1 fL (ref 80.0–100.0)
Monocytes Absolute: 1.9 10*3/uL — ABNORMAL HIGH (ref 0.1–1.0)
Monocytes Relative: 6 %
Neutro Abs: 4.3 10*3/uL (ref 1.7–7.7)
Neutrophils Relative %: 13 %
Platelets: 160 10*3/uL (ref 150–400)
RBC: 4.84 MIL/uL (ref 3.87–5.11)
RDW: 14.4 % (ref 11.5–15.5)
Smear Review: NORMAL
WBC Morphology: ABNORMAL
WBC: 31.9 10*3/uL — ABNORMAL HIGH (ref 4.0–10.5)
nRBC: 0 % (ref 0.0–0.2)

## 2021-04-21 LAB — LACTATE DEHYDROGENASE: LDH: 116 U/L (ref 98–192)

## 2021-04-21 LAB — COMPREHENSIVE METABOLIC PANEL
ALT: 9 U/L (ref 0–44)
AST: 18 U/L (ref 15–41)
Albumin: 3.8 g/dL (ref 3.5–5.0)
Alkaline Phosphatase: 44 U/L (ref 38–126)
Anion gap: 6 (ref 5–15)
BUN: 16 mg/dL (ref 8–23)
CO2: 28 mmol/L (ref 22–32)
Calcium: 8.5 mg/dL — ABNORMAL LOW (ref 8.9–10.3)
Chloride: 102 mmol/L (ref 98–111)
Creatinine, Ser: 1 mg/dL (ref 0.44–1.00)
GFR, Estimated: 54 mL/min — ABNORMAL LOW (ref >60)
Glucose, Bld: 99 mg/dL (ref 70–99)
Potassium: 4.1 mmol/L (ref 3.5–5.1)
Sodium: 136 mmol/L (ref 135–145)
Total Bilirubin: 0.8 mg/dL (ref 0.3–1.2)
Total Protein: 7.1 g/dL (ref 6.5–8.1)

## 2021-04-21 NOTE — Progress Notes (Signed)
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  Most recently her CBC showed white count of 26.4, H&H of 14.8/46.1 and platelet count of 159.  Differential mainly showed lymphocytosis with some basophilia and monocytosis.  Patient has had chronic leukocytosis/lymphocytosis at least dating back to 2017 but since this year her white count has been in the 20s.  Hemoglobin and platelets have always been normal.  Differential in the past also has shown mainly lymphocytosis and some monocytosis.  Patient presently complains of pain in her right paraspinal area.  Reports that the pain worsens when she moves around or bends.  She had a CT abdomen and pelvis with contrast done a couple of days ago which showed liver cysts and mild splenomegaly of 14 cm.  No intra-abdominal adenopathy no other acute intra-abdominal pathology.  Patient reports that her appetite and weight have remained stable.    Results of blood work from 05/06/2020 were as follows: CBC showed white cell count of 29.3, H&H of 14.5/45.4 with a platelet count of 162.  Differential mainly showed lymphocytosis with an absolute lymphocyte count of 20 with some monocytosis and basophilia.  BCR ABL FISH testing was negative.  Smear review showed monomorphic  lymphoid cells which were large with abundant cytoplasm.  Morphology indicative of a lymphoid neoplasm.  Differentiations include CLL with variant morphology, leukemic phase of lymphoma such as mantle cell lymphoma or prolymphocytic leukemia.  Flow cytometry showed CD5 negative CD10 negative and CD 103 - B-cell lymphoproliferative disorder.  Interval history-Lori Browning is an 88 year old female who presents with her daughter today for follow-up.  Reports overall doing well.  Since her last visit she denies any new concerns.  Denies any B symptoms, unexplained weight loss, changes in appetite or night sweats.  States she has been trying to lose weight and has been unsuccessful.  ECOG PS- 1 Pain scale- 0   Review of systems- Review of Systems  Constitutional: Negative.  Negative for chills, fever, malaise/fatigue and weight loss.  HENT:  Negative for congestion, ear pain and tinnitus.   Eyes: Negative.  Negative for blurred vision and double vision.  Respiratory: Negative.  Negative for cough, sputum production and shortness of breath.   Cardiovascular: Negative.  Negative for chest pain, palpitations and leg swelling.  Gastrointestinal: Negative.  Negative for abdominal pain, constipation, diarrhea, nausea and vomiting.  Genitourinary:  Negative for dysuria, frequency and urgency.  Musculoskeletal:  Negative for back pain and falls.  Skin: Negative.  Negative for rash.  Neurological: Negative.  Negative for weakness and headaches.  Endo/Heme/Allergies: Negative.  Does not bruise/bleed easily.  Psychiatric/Behavioral: Negative.  Negative for depression. The patient is not nervous/anxious and does not have insomnia.      Allergies  Allergen Reactions   Sulfa Antibiotics Nausea And Vomiting and Rash     Past Medical History:  Diagnosis Date   Atypical mole 07/07/2017   right upper back/severe. excision  07/07/2017. 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)    Urge incontinence      Past Surgical History:  Procedure Laterality Date   BREAST CYST EXCISION     CATARACT EXTRACTION     hip replacment     REPLACEMENT TOTAL KNEE      Social History   Socioeconomic History   Marital status: Widowed    Spouse name: Not on file   Number of children: Not on file   Years of education: Not on file   Highest education level: Not on file  Occupational History   Not on file  Tobacco Use   Smoking status: Never   Smokeless tobacco: Never  Vaping Use   Vaping Use: Never used  Substance and Sexual Activity   Alcohol use: Never   Drug use: Never   Sexual activity: Not on file  Other Topics Concern   Not on file  Social History Narrative   Not on file   Social Determinants of Health   Financial Resource Strain: Not on file  Food Insecurity: Not on file  Transportation Needs: Not on file  Physical Activity: Not on file  Stress: Not on file  Social Connections: Not on file  Intimate Partner Violence: Not on file    Family History  Problem Relation Age of Onset   Hypertension Mother    Colon cancer Mother    Breast cancer Mother    Aortic aneurysm Father    Pancreatic cancer Brother    Colon cancer Brother    Heart disease Brother      Current Outpatient Medications:    albuterol (PROVENTIL) (2.5 MG/3ML) 0.083% nebulizer solution, Take 2.5 mg by nebulization every 6 (six) hours as needed for wheezing or shortness of breath., Disp: , Rfl:    carvedilol (COREG) 25 MG tablet, Take 25 mg by mouth 2 (two) times daily with a meal., Disp: , Rfl:    Cranberry 500 MG CAPS, Take 1 tablet by mouth daily. , Disp: , Rfl:    mometasone (ELOCON) 0.1 % lotion, SMARTSIG:In Ear(s), Disp: , Rfl:    pravastatin (PRAVACHOL) 40 MG tablet, Take 40 mg by mouth daily., Disp: , Rfl:    spironolactone (ALDACTONE) 25 MG tablet, Take 25 mg by  mouth daily., Disp: , Rfl:    furosemide (LASIX) 40 MG tablet, Take 1 tablet (40 mg total) by mouth daily. (Patient not taking: Reported on 10/21/2020), Disp: 30 tablet, Rfl: 0  Physical exam:  Vitals:   04/21/21 1409  BP: (!) 195/113  Pulse: 63  Temp: 97.7 F (36.5 C)  TempSrc: Tympanic  Weight: 229 lb 14.4 oz (104.3 kg)   Physical Exam Constitutional:      Appearance: Normal appearance.  Cardiovascular:     Rate and Rhythm: Normal rate and regular rhythm.     Heart sounds: Normal heart sounds. No murmur heard. Pulmonary:     Effort: Pulmonary effort is normal.     Breath sounds: Normal breath sounds. No wheezing.  Abdominal:     General: Bowel sounds are normal. There is no distension.     Tenderness: There is no abdominal tenderness.  Musculoskeletal:        General: Normal range of motion.  Skin:    General: Skin is warm and dry.     Findings: No  rash.  Neurological:     Mental Status: She is alert and oriented to person, place, and time.  Psychiatric:        Judgment: Judgment normal.     CMP Latest Ref Rng & Units 04/21/2021  Glucose 70 - 99 mg/dL 99  BUN 8 - 23 mg/dL 16  Creatinine 0.44 - 1.00 mg/dL 1.00  Sodium 135 - 145 mmol/L 136  Potassium 3.5 - 5.1 mmol/L 4.1  Chloride 98 - 111 mmol/L 102  CO2 22 - 32 mmol/L 28  Calcium 8.9 - 10.3 mg/dL 8.5(L)  Total Protein 6.5 - 8.1 g/dL 7.1  Total Bilirubin 0.3 - 1.2 mg/dL 0.8  Alkaline Phos 38 - 126 U/L 44  AST 15 - 41 U/L 18  ALT 0 - 44 U/L 9   CBC Latest Ref Rng & Units 04/21/2021  WBC 4.0 - 10.5 K/uL 31.9(H)  Hemoglobin 12.0 - 15.0 g/dL 14.2  Hematocrit 36.0 - 46.0 % 44.1  Platelets 150 - 400 K/uL 160     Assessment and plan- Patient is a 88 y.o. female with evidence of B-cell clonal lymphoproliferative disorder detected on flow cytometry  Patient's white cell count has been stable in the 20s for the last 6 months and has increased slightly today to 31.9.  No evidence of significant anemia.  No  thrombocytopenia. Persipheral smear is normal. No B symptoms.  Ultrasound of the spleen on 04/10/2021 shows a spleen measuring 18.7 x 15.8 x 6.6 cm with estimated volume of 803 cc.  Previous CT scan showed estimated splenomegaly at 14 cm.  Continue to monitor counts in 2, 4 and 6 months with an ultrasound of her abdomen prior.  She will follow-up with Dr. Janese Banks after ultrasound.  Disposition-return to clinic in 2, 4 and 6 months for labs.  We will have an abdominal ultrasound of her spleen in 6 months followed by follow-up with Dr. Janese Banks.  I spent 25 minutes dedicated to the care of this patient (face-to-face and non-face-to-face) on the date of the encounter to include what is described in the assessment and plan.  Visit Diagnosis 1. Splenomegaly   2. B-cell abnormality    Faythe Casa, NP 04/21/2021 2:39 PM

## 2021-06-23 ENCOUNTER — Other Ambulatory Visit: Payer: Self-pay

## 2021-06-23 ENCOUNTER — Inpatient Hospital Stay: Payer: Medicare HMO | Attending: Oncology

## 2021-06-23 DIAGNOSIS — R161 Splenomegaly, not elsewhere classified: Secondary | ICD-10-CM | POA: Diagnosis present

## 2021-06-23 DIAGNOSIS — R718 Other abnormality of red blood cells: Secondary | ICD-10-CM | POA: Insufficient documentation

## 2021-06-23 LAB — CBC WITH DIFFERENTIAL/PLATELET
Abs Immature Granulocytes: 0.06 10*3/uL (ref 0.00–0.07)
Basophils Absolute: 0.3 10*3/uL — ABNORMAL HIGH (ref 0.0–0.1)
Basophils Relative: 1 %
Eosinophils Absolute: 0.2 10*3/uL (ref 0.0–0.5)
Eosinophils Relative: 1 %
HCT: 43.2 % (ref 36.0–46.0)
Hemoglobin: 13.7 g/dL (ref 12.0–15.0)
Immature Granulocytes: 0 %
Lymphocytes Relative: 74 %
Lymphs Abs: 20.3 10*3/uL — ABNORMAL HIGH (ref 0.7–4.0)
MCH: 28.7 pg (ref 26.0–34.0)
MCHC: 31.7 g/dL (ref 30.0–36.0)
MCV: 90.6 fL (ref 80.0–100.0)
Monocytes Absolute: 2.5 10*3/uL — ABNORMAL HIGH (ref 0.1–1.0)
Monocytes Relative: 9 %
Neutro Abs: 4.1 10*3/uL (ref 1.7–7.7)
Neutrophils Relative %: 15 %
Platelets: 142 10*3/uL — ABNORMAL LOW (ref 150–400)
RBC: 4.77 MIL/uL (ref 3.87–5.11)
RDW: 14.2 % (ref 11.5–15.5)
Smear Review: NORMAL
WBC: 27.4 10*3/uL — ABNORMAL HIGH (ref 4.0–10.5)
nRBC: 0 % (ref 0.0–0.2)

## 2021-08-22 ENCOUNTER — Inpatient Hospital Stay: Payer: Medicare HMO | Attending: Oncology

## 2021-08-22 DIAGNOSIS — R718 Other abnormality of red blood cells: Secondary | ICD-10-CM | POA: Diagnosis not present

## 2021-08-22 DIAGNOSIS — R161 Splenomegaly, not elsewhere classified: Secondary | ICD-10-CM | POA: Diagnosis present

## 2021-08-22 LAB — CBC WITH DIFFERENTIAL/PLATELET
Abs Immature Granulocytes: 0.09 10*3/uL — ABNORMAL HIGH (ref 0.00–0.07)
Basophils Absolute: 0.3 10*3/uL — ABNORMAL HIGH (ref 0.0–0.1)
Basophils Relative: 1 %
Eosinophils Absolute: 0.2 10*3/uL (ref 0.0–0.5)
Eosinophils Relative: 1 %
HCT: 41.5 % (ref 36.0–46.0)
Hemoglobin: 13.7 g/dL (ref 12.0–15.0)
Immature Granulocytes: 0 %
Lymphocytes Relative: 71 %
Lymphs Abs: 22.4 10*3/uL — ABNORMAL HIGH (ref 0.7–4.0)
MCH: 29.5 pg (ref 26.0–34.0)
MCHC: 33 g/dL (ref 30.0–36.0)
MCV: 89.4 fL (ref 80.0–100.0)
Monocytes Absolute: 1.9 10*3/uL — ABNORMAL HIGH (ref 0.1–1.0)
Monocytes Relative: 6 %
Neutro Abs: 6.5 10*3/uL (ref 1.7–7.7)
Neutrophils Relative %: 21 %
Platelets: 152 10*3/uL (ref 150–400)
RBC: 4.64 MIL/uL (ref 3.87–5.11)
RDW: 14.8 % (ref 11.5–15.5)
Smear Review: NORMAL
WBC Morphology: ABNORMAL
WBC: 31.3 10*3/uL — ABNORMAL HIGH (ref 4.0–10.5)
nRBC: 0 % (ref 0.0–0.2)

## 2021-10-23 ENCOUNTER — Ambulatory Visit
Admission: RE | Admit: 2021-10-23 | Discharge: 2021-10-23 | Disposition: A | Discharge status: Home / Self Care | Payer: Medicare HMO | Source: Ambulatory Visit | Attending: Oncology | Admitting: Oncology

## 2021-10-23 ENCOUNTER — Inpatient Hospital Stay: Payer: Medicare HMO | Attending: Oncology

## 2021-10-23 ENCOUNTER — Other Ambulatory Visit: Payer: Self-pay | Admitting: *Deleted

## 2021-10-23 ENCOUNTER — Inpatient Hospital Stay: Payer: Medicare HMO

## 2021-10-23 DIAGNOSIS — R161 Splenomegaly, not elsewhere classified: Secondary | ICD-10-CM | POA: Insufficient documentation

## 2021-10-23 DIAGNOSIS — D72820 Lymphocytosis (symptomatic): Secondary | ICD-10-CM

## 2021-10-23 LAB — CBC WITH DIFFERENTIAL/PLATELET
Abs Immature Granulocytes: 0.09 10*3/uL — ABNORMAL HIGH (ref 0.00–0.07)
Basophils Absolute: 0.1 10*3/uL (ref 0.0–0.1)
Basophils Relative: 0 %
Eosinophils Absolute: 0.2 10*3/uL (ref 0.0–0.5)
Eosinophils Relative: 1 %
HCT: 41.1 % (ref 36.0–46.0)
Hemoglobin: 13.4 g/dL (ref 12.0–15.0)
Immature Granulocytes: 0 %
Lymphocytes Relative: 77 %
Lymphs Abs: 24.9 10*3/uL — ABNORMAL HIGH (ref 0.7–4.0)
MCH: 29.6 pg (ref 26.0–34.0)
MCHC: 32.6 g/dL (ref 30.0–36.0)
MCV: 90.7 fL (ref 80.0–100.0)
Monocytes Absolute: 2.3 10*3/uL — ABNORMAL HIGH (ref 0.1–1.0)
Monocytes Relative: 7 %
Neutro Abs: 4.9 10*3/uL (ref 1.7–7.7)
Neutrophils Relative %: 15 %
Platelets: 150 10*3/uL (ref 150–400)
RBC: 4.53 MIL/uL (ref 3.87–5.11)
RDW: 14.9 % (ref 11.5–15.5)
Smear Review: NORMAL
WBC: 32.5 10*3/uL — ABNORMAL HIGH (ref 4.0–10.5)
nRBC: 0 % (ref 0.0–0.2)

## 2021-10-28 ENCOUNTER — Inpatient Hospital Stay: Payer: Medicare HMO | Admitting: Oncology

## 2021-10-28 ENCOUNTER — Encounter: Payer: Self-pay | Admitting: Oncology

## 2021-10-28 VITALS — BP 126/52 | HR 57 | Temp 97.8°F | Resp 18 | Ht 66.0 in | Wt 229.0 lb

## 2021-10-28 DIAGNOSIS — D72820 Lymphocytosis (symptomatic): Secondary | ICD-10-CM | POA: Diagnosis not present

## 2021-10-28 DIAGNOSIS — D47Z9 Other specified neoplasms of uncertain behavior of lymphoid, hematopoietic and related tissue: Secondary | ICD-10-CM | POA: Diagnosis not present

## 2021-10-28 NOTE — Progress Notes (Signed)
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  Most recently her CBC showed white count of 26.4, H&H of 14.8/46.1 and platelet count of 159.  Differential mainly showed lymphocytosis with some basophilia and monocytosis.  Patient has had chronic leukocytosis/lymphocytosis at least dating back to 2017 but since this year her white count has been in the 20s.  Hemoglobin and platelets have always been normal.  Differential in the past also has shown mainly lymphocytosis and some monocytosis.  Patient presently complains of pain in her right paraspinal area.  Reports that the pain worsens when she moves around or bends.  She had a CT abdomen and pelvis with contrast done a couple of days ago which showed liver cysts and mild splenomegaly of 14 cm.  No intra-abdominal adenopathy no other acute intra-abdominal pathology.  Patient reports that her appetite and weight have remained stable.    Results of blood work from 05/06/2020 were as follows: CBC showed white cell count of 29.3, H&H of 14.5/45.4 with a platelet count of 162.  Differential mainly showed lymphocytosis with an absolute lymphocyte count of 20 with some monocytosis and basophilia.  BCR ABL FISH testing was negative.  Smear review showed monomorphic  lymphoid cells which were large with abundant cytoplasm.  Morphology indicative of a lymphoid neoplasm.  Differentiations include CLL with variant morphology, leukemic phase of lymphoma such as mantle cell lymphoma or prolymphocytic leukemia.  Flow cytometry showed CD5 negative CD10 negative and CD 103 - B-cell lymphoproliferative disorder.    Interval history-patient reports doing well for her age.  Denies any specific complaints at this time.  Appetite and weight have remained stable.  Denies any drenching night sweats.  Denies any lumps or bumps anywhere.  ECOG PS- 1 Pain scale- 0   Review of systems- Review of Systems  Constitutional:  Negative for chills, fever, malaise/fatigue and weight loss.  HENT:  Negative for congestion, ear discharge and nosebleeds.   Eyes:  Negative for blurred vision.  Respiratory:  Negative for cough, hemoptysis, sputum production, shortness of breath and wheezing.   Cardiovascular:  Negative for chest pain, palpitations, orthopnea and claudication.  Gastrointestinal:  Negative for abdominal pain, blood in stool, constipation, diarrhea, heartburn, melena, nausea and vomiting.  Genitourinary:  Negative for dysuria, flank pain, frequency, hematuria and urgency.  Musculoskeletal:  Negative for back pain, joint pain and myalgias.  Skin:  Negative for rash.  Neurological:  Negative for dizziness, tingling, focal weakness, seizures, weakness and headaches.  Endo/Heme/Allergies:  Does not bruise/bleed easily.  Psychiatric/Behavioral:  Negative for depression and suicidal ideas. The patient does not have insomnia.       Allergies  Allergen Reactions   Sulfa Antibiotics Nausea And Vomiting and Rash     Past Medical History:  Diagnosis Date   Atypical mole 07/07/2017   right upper back/severe. excision 07/07/2017. AMP   CHF (congestive heart failure) (HCC)    COPD (chronic  obstructive pulmonary disease) (HCC)    Hyperlipemia    Hypertension    Low grade B  cell lymphoproliferative disorder (HCC)    Urge incontinence      Past Surgical History:  Procedure Laterality Date   BREAST CYST EXCISION     CATARACT EXTRACTION     hip replacment     REPLACEMENT TOTAL KNEE      Social History   Socioeconomic History   Marital status: Widowed    Spouse name: Not on file   Number of children: Not on file   Years of education: Not on file   Highest education level: Not on file  Occupational History   Not on file  Tobacco Use   Smoking status: Never   Smokeless tobacco: Never  Vaping Use   Vaping Use: Never used  Substance and Sexual Activity   Alcohol use: Never   Drug use: Never   Sexual activity: Not Currently  Other Topics Concern   Not on file  Social History Narrative   Not on file   Social Determinants of Health   Financial Resource Strain: Not on file  Food Insecurity: Not on file  Transportation Needs: Not on file  Physical Activity: Not on file  Stress: Not on file  Social Connections: Not on file  Intimate Partner Violence: Not on file    Family History  Problem Relation Age of Onset   Hypertension Mother    Colon cancer Mother    Breast cancer Mother    Aortic aneurysm Father    Pancreatic cancer Brother    Colon cancer Brother    Heart disease Brother    Heart Problems Brother      Current Outpatient Medications:    amLODipine (NORVASC) 5 MG tablet, Take 5 mg by mouth daily., Disp: , Rfl:    carvedilol (COREG) 25 MG tablet, Take 25 mg by mouth 2 (two) times daily with a meal., Disp: , Rfl:    Cranberry 500 MG CAPS, Take 1 tablet by mouth daily. , Disp: , Rfl:    losartan-hydrochlorothiazide (HYZAAR) 100-12.5 MG tablet, Take 1 tablet by mouth daily., Disp: , Rfl:    pravastatin (PRAVACHOL) 40 MG tablet, Take 40 mg by mouth daily., Disp: , Rfl:    spironolactone (ALDACTONE) 25 MG tablet, Take 25 mg by mouth daily., Disp: , Rfl:   Physical exam:  Vitals:   10/28/21 1401  BP: (!) 126/52  Pulse: (!) 57   Resp: 18  Temp: 97.8 F (36.6 C)  TempSrc: Oral  Weight: 229 lb (103.9 kg)  Height: 5' 6"  (1.676 m)   Physical Exam Constitutional:      General: She is not in acute distress. Cardiovascular:     Rate and Rhythm: Normal rate and regular rhythm.     Heart sounds: Normal heart sounds.  Pulmonary:     Effort: Pulmonary effort is normal.     Breath sounds: Normal breath sounds.  Abdominal:     General: Bowel sounds are normal.     Palpations: Abdomen is soft.     Comments: No palpable splenomegaly  Musculoskeletal:     Cervical back: Normal range of motion.  Lymphadenopathy:     Comments: No palpable cervical, supraclavicular, axillary or inguinal adenopathy    Skin:    General: Skin is warm and dry.  Neurological:     Mental Status: She is alert and oriented to person, place, and time.         Latest  Ref Rng & Units 04/21/2021    1:54 PM  CMP  Glucose 70 - 99 mg/dL 99   BUN 8 - 23 mg/dL 16   Creatinine 0.44 - 1.00 mg/dL 1.00   Sodium 135 - 145 mmol/L 136   Potassium 3.5 - 5.1 mmol/L 4.1   Chloride 98 - 111 mmol/L 102   CO2 22 - 32 mmol/L 28   Calcium 8.9 - 10.3 mg/dL 8.5   Total Protein 6.5 - 8.1 g/dL 7.1   Total Bilirubin 0.3 - 1.2 mg/dL 0.8   Alkaline Phos 38 - 126 U/L 44   AST 15 - 41 U/L 18   ALT 0 - 44 U/L 9       Latest Ref Rng & Units 10/23/2021    8:45 AM  CBC  WBC 4.0 - 10.5 K/uL 32.5   Hemoglobin 12.0 - 15.0 g/dL 13.4   Hematocrit 36.0 - 46.0 % 41.1   Platelets 150 - 400 K/uL 150     No images are attached to the encounter.  US SPLEEN (ABDOMEN LIMITED)  Result Date: 10/24/2021 CLINICAL DATA:  Splenomegaly B-cell disorder EXAM: ULTRASOUND ABDOMEN LIMITED COMPARISON:  Ultrasound 04/10/2021, PET CT 05/30/2020 FINDINGS: Targeted ultrasound of the left upper quadrant is performed. The spleen is enlarged and measures 16.3 x 16.4 x 7.2 cm with volume of 1003.30 cc, previously 803 cc. IMPRESSION: Splenomegaly with increased splenic volume compared to  prior. Electronically Signed   By: Donavan Foil M.D.   On: 10/24/2021 15:41     Assessment and plan- Patient is a 88 y.o. female with B-cell clonal lymphoproliferative disorder here for routineFollow-up  Patient has isolated leukocytosis mainly with lymphocytosis and monocytosis which has been ongoing at least dating back to 2020.  Her white cell count has been gradually increasing over the last 1 year from the 20s presently to 32.5.  Hemoglobin and platelet counts have remained stable.  Ultrasound spleen shows mildly worsening splenomegaly which is up to 16 cm from a prior 14 cm and a volume of 1000 cc.  PET CT scan done last year did not show any evidence of other adenopathy.  Flow cytometry done last year also showed CD5 negative CD10 negative CD103 negative B-cell lymphoproliferative disorder which was a nonspecific phenotype which can be seen in patients with CLL.  Presently her lymphocyte doubling time remains more than 6 months and patient does not have any B symptoms.  BCR ABL testing was negative as well.  Patient has not had a bone marrow biopsy done yet.  My plan is to continue monitoring her CBC at this time and I will see her back in 4 months with repeat CBC CMP and flow cytometry to see if there is any evidence of clonal evolution.  I will also get a PET CT scan at that time.   Visit Diagnosis 1. 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 Randa Evens, MD, MPH [facility] [phone] 10/28/2021 4:54 PM

## 2021-12-30 ENCOUNTER — Encounter: Payer: Medicare HMO | Admitting: Dermatology

## 2021-12-31 ENCOUNTER — Encounter: Payer: Medicare HMO | Admitting: Dermatology

## 2022-02-12 ENCOUNTER — Ambulatory Visit: Payer: Medicare HMO | Admitting: Dermatology

## 2022-02-12 DIAGNOSIS — Z1283 Encounter for screening for malignant neoplasm of skin: Secondary | ICD-10-CM

## 2022-02-12 DIAGNOSIS — L820 Inflamed seborrheic keratosis: Secondary | ICD-10-CM

## 2022-02-12 DIAGNOSIS — L814 Other melanin hyperpigmentation: Secondary | ICD-10-CM | POA: Diagnosis not present

## 2022-02-12 DIAGNOSIS — L578 Other skin changes due to chronic exposure to nonionizing radiation: Secondary | ICD-10-CM

## 2022-02-12 DIAGNOSIS — Z86018 Personal history of other benign neoplasm: Secondary | ICD-10-CM

## 2022-02-12 DIAGNOSIS — D229 Melanocytic nevi, unspecified: Secondary | ICD-10-CM

## 2022-02-12 DIAGNOSIS — L821 Other seborrheic keratosis: Secondary | ICD-10-CM

## 2022-02-12 DIAGNOSIS — D492 Neoplasm of unspecified behavior of bone, soft tissue, and skin: Secondary | ICD-10-CM

## 2022-02-12 DIAGNOSIS — D235 Other benign neoplasm of skin of trunk: Secondary | ICD-10-CM

## 2022-02-12 NOTE — Patient Instructions (Addendum)
Recommend Cutemol to hands and nails.   Recommend vitamin D 8000 IU daily.   Wound Care Instructions  Cleanse wound gently with Hibaclens and water once a day then pat dry with clean gauze. Apply a thin coat of Petrolatum (petroleum jelly, "Vaseline") over the wound (unless you have an allergy to this). We recommend that you use a new, sterile tube of Vaseline. Do not pick or remove scabs. Do not remove the yellow or white "healing tissue" from the base of the wound.  Cover the wound with fresh, clean, nonstick gauze and secure with paper tape. You may use Band-Aids in place of gauze and tape if the wound is small enough, but would recommend trimming much of the tape off as there is often too much. Sometimes Band-Aids can irritate the skin.  You should call the office for your biopsy report after 1 week if you have not already been contacted.  If you experience any problems, such as abnormal amounts of bleeding, swelling, significant bruising, significant pain, or evidence of infection, please call the office immediately.   Recommend taking Heliocare sun protection supplement daily in sunny weather for additional sun protection. For maximum protection on the sunniest days, you can take up to 2 capsules of regular Heliocare OR take 1 capsule of Heliocare Ultra. For prolonged exposure (such as a full day in the sun), you can repeat your dose of the supplement 4 hours after your first dose. Heliocare can be purchased at [employer], at some [facility] or at [email or web address].    Melanoma ABCDEs  Melanoma is the most dangerous type of skin cancer, and is the leading cause of death from skin disease.  You are more likely to develop melanoma if you: Have light-colored skin, light-colored eyes, or red or blond hair Spend a lot of time in the sun Tan regularly, either outdoors or in a tanning bed Have had blistering sunburns, especially during childhood Have a close family member who has had  a melanoma Have atypical moles or large birthmarks  Early detection of melanoma is key since treatment is typically straightforward and cure rates are extremely high if we catch it early.   The first sign of melanoma is often a change in a mole or a new dark spot.  The ABCDE system is a way of remembering the signs of melanoma.  A for asymmetry:  The two halves do not match. B for border:  The edges of the growth are irregular. C for color:  A mixture of colors are present instead of an even brown color. D for diameter:  Melanomas are usually (but not always) greater than 49m - the size of a pencil eraser. E for evolution:  The spot keeps changing in size, shape, and color.  Please check your skin once per month between visits. You can use a small mirror in front and a large mirror behind you to keep an eye on the back side or your body.   If you see any new or changing lesions before your next follow-up, please call to schedule a visit.  Please continue daily skin protection including broad spectrum sunscreen SPF 30+ to sun-exposed areas, reapplying every 2 hours as needed when you're outdoors.    Due to recent changes in healthcare laws, you may see results of your pathology and/or laboratory studies on MyChart before the doctors have had a chance to review them. We understand that in some cases there may be results that are confusing or  concerning to you. Please understand that not all results are received at the same time and often the doctors may need to interpret multiple results in order to provide you with the best plan of care or course of treatment. Therefore, we ask that you please give [place] 2 business days to thoroughly review all your results before contacting the office for clarification. Should we see a critical lab result, you will be contacted sooner.   If You Need Anything After Your Visit  If you have any questions or concerns for your doctor, please call our main line at  [phone] and press option 4 to reach your doctor's medical assistant. If no one answers, please leave a voicemail as directed and we will return your call as soon as possible. Messages left after 4 pm will be answered the following business day.   You may also send [place] a message via [place]. We typically respond to MyChart messages within 1-2 business days.  For prescription refills, please ask your pharmacy to contact our office. Our fax number is [phone].  If you have an urgent issue when the clinic is closed that cannot wait until the next business day, you can page your doctor at the number below.    Please note that while we do our best to be available for urgent issues outside of office hours, we are not available 24/7.   If you have an urgent issue and are unable to reach [place], you may choose to seek medical care at your doctor's office, retail clinic, urgent care center, or emergency room.  If you have a medical emergency, please immediately call 911 or go to the emergency department.  Pager Numbers  - Dr. Nehemiah Massed: [phone]  - Dr. Laurence Ferrari: [phone]  - Dr. Nicole Kindred: [phone]  In the event of inclement weather, please call our main line at [phone] for an update on the status of any delays or closures.  Dermatology Medication Tips: Please keep the boxes that topical medications come in in order to help keep track of the instructions about where and how to use these. Pharmacies typically print the medication instructions only on the boxes and not directly on the medication tubes.   If your medication is too expensive, please contact our office at [phone] option 4 or send [place] a message through [place].   We are unable to tell what your co-pay for medications will be in advance as this is different depending on your insurance coverage. However, we may be able to find a substitute medication at lower cost or fill out paperwork to get insurance to cover a needed  medication.   If a prior authorization is required to get your medication covered by your insurance company, please allow [place] 1-2 business days to complete this process.  Drug prices often vary depending on where the prescription is filled and some pharmacies may offer cheaper prices.  The website www.goodrx.com contains coupons for medications through different pharmacies. The prices here do not account for what the cost may be with help from insurance (it may be cheaper with your insurance), but the website can give you the price if you did not use any insurance.  - You can print the associated coupon and take it with your prescription to the pharmacy.  - You may also stop by our office during regular business hours and pick up a GoodRx coupon card.  - If you need your prescription sent electronically to a different pharmacy, notify our office through  [place] or by phone at [phone] option 4.     Si Usted Necesita Algo Despus de Su Visita  Tambin puede enviarnos un mensaje a travs de Pharmacist, community. Por lo general respondemos a los mensajes de MyChart en el transcurso de 1 a 2 das hbiles.  Para renovar recetas, por favor pida a su farmacia que se ponga en contacto con nuestra oficina. Harland Dingwall de fax es [place] [phone].  Si tiene un asunto urgente cuando la clnica est cerrada y que no puede esperar hasta el siguiente da hbil, puede llamar/localizar a su doctor(a) al nmero que aparece a continuacin.   Por favor, tenga en cuenta que aunque hacemos todo lo posible para estar disponibles para asuntos urgentes fuera del horario de [place], no estamos disponibles las 24 horas del da, los 7 das de la [place].   Si tiene un problema urgente y no puede comunicarse con nosotros, puede optar por buscar atencin mdica  en el consultorio de su doctor(a), en una clnica privada, en un centro de atencin urgente o en una sala de emergencias.  Si tiene Engineering geologist,  por favor llame inmediatamente al 911 o vaya a la sala de emergencias.  Nmeros de bper  - Dr. Nehemiah Massed: [phone]  - Dra. Moye: [phone]  - Dra. Nicole Kindred: [phone]  En caso de inclemencias del [place], por favor llame a Johnsie Kindred principal al [phone] para una actualizacin sobre el [place] de cualquier retraso o cierre.  Consejos para la medicacin en dermatologa: Por favor, guarde las cajas en las que vienen los medicamentos de uso tpico para ayudarle a seguir las instrucciones sobre dnde y cmo usarlos. Las farmacias generalmente imprimen las instrucciones del medicamento slo en las cajas y no directamente en los tubos del [place].   Si su medicamento es muy caro, por favor, pngase en contacto con Zigmund Daniel llamando al [phone] y presione la opcin 4 o envenos un mensaje a travs de Pharmacist, community.   No podemos decirle cul ser su copago por los medicamentos por adelantado ya que esto es diferente dependiendo de la cobertura de su seguro. Sin embargo, es posible que podamos encontrar un medicamento sustituto a Electrical engineer un formulario para que el seguro cubra el medicamento que se considera necesario.   Si se requiere una autorizacin previa para que su compaa de seguros [place] su medicamento, por favor permtanos de 1 a 2 das hbiles para completar este proceso.  Los precios de los medicamentos varan con frecuencia dependiendo del Environmental consultant de dnde se surte la receta y alguna farmacias pueden ofrecer precios ms baratos.  El sitio web www.goodrx.com tiene cupones para medicamentos de Airline pilot. Los precios aqu no tienen en cuenta lo que podra costar con la ayuda del seguro (puede ser ms barato con su seguro), pero el sitio web puede darle el precio si no utiliz Research scientist (physical sciences).  - Puede imprimir el cupn correspondiente y llevarlo con su receta a la farmacia.  - Tambin puede pasar por nuestra oficina durante el horario de atencin  regular y Charity fundraiser una tarjeta de cupones de GoodRx.  - Si necesita que su receta se enve electrnicamente a una farmacia diferente, informe a nuestra oficina a travs de MyChart de [place] o por telfono llamando al [phone] y presione la opcin 4.

## 2022-02-12 NOTE — Progress Notes (Signed)
Follow-Up Visit   Subjective  Lori Browning is a 89 y.o. female who presents for the following: FBSE (Hx dysplastic nevi).  The patient presents for Total-Body Skin Exam (TBSE) for skin cancer screening and mole check.  The patient has spots, moles and lesions to be evaluated, some may be new or changing and the patient has concerns that these could be cancer.   The following portions of the chart were reviewed this encounter and updated as appropriate:   Tobacco  Allergies  Meds  Problems  Med Hx  Surg Hx  Fam Hx      Review of Systems:  No other skin or systemic complaints except as noted in HPI or Assessment and Plan.  Objective  Well appearing patient in no apparent distress; mood and affect are within normal limits.  A full examination was performed including scalp, head, eyes, ears, nose, lips, neck, chest, axillae, abdomen, back, buttocks, bilateral upper extremities, bilateral lower extremities, hands, feet, fingers, toes, fingernails, and toenails. All findings within normal limits unless otherwise noted below.  Left Medial Knee 0.9 cm scaly pink papule R/o SCC         Assessment & Plan  Neoplasm of skin Left Medial Knee  Epidermal / dermal shaving  Lesion diameter (cm):  0.9 Informed consent: discussed and consent obtained   Timeout: patient name, date of birth, surgical site, and procedure verified   Anesthesia: the lesion was anesthetized in a standard fashion   Anesthetic:  1% lidocaine w/ epinephrine 1-100,000 local infiltration Instrument used: flexible razor blade   Hemostasis achieved with: aluminum chloride   Outcome: patient tolerated procedure well   Post-procedure details: wound care instructions given   Additional details:  Mupirocin and a bandage applied  Destruction of lesion  Destruction method: electrodesiccation and curettage   Informed consent: discussed and consent obtained   Timeout:  patient name, date of birth, surgical  site, and procedure verified Anesthesia: the lesion was anesthetized in a standard fashion   Anesthetic:  1% lidocaine w/ epinephrine 1-100,000 buffered w/ 8.4% NaHCO3 Curettage performed in three different directions: Yes   Electrodesiccation performed over the curetted area: Yes   Curettage cycles:  3 Final wound size (cm):  1.3 Hemostasis achieved with:  electrodesiccation Outcome: patient tolerated procedure well with no complications   Post-procedure details: sterile dressing applied and wound care instructions given   Dressing type: petrolatum    Specimen 1 - Surgical pathology Differential Diagnosis: R/o SCC  Check Margins: No 0.9 cm scaly pink papule Treated with EDC   Samples of Hibiclens given to patient    History of Dysplastic Nevi - could not r/o early evolving lentigo maligna - No evidence of recurrence today at right upper back, severe/excised 06/2017 - No lymphadenopathy - Recommend regular full body skin exams - Recommend daily broad spectrum sunscreen SPF 30+ to sun-exposed areas, reapply every 2 hours as needed.  - Call if any new or changing lesions are noted between office visit  Lentigines - Scattered tan macules - Due to sun exposure - Benign-appearing, observe - Recommend daily broad spectrum sunscreen SPF 30+ to sun-exposed areas, reapply every 2 hours as needed. - Call for any changes  Seborrheic Keratoses - Stuck-on, waxy, tan-brown papules and/or plaques  - Benign-appearing - Discussed benign etiology and prognosis. - Observe - Call for any changes  Melanocytic Nevi - Tan-brown and/or pink-flesh-colored symmetric macules and papules - Benign appearing on exam today - Observation - Call clinic for new  or changing moles - Recommend daily use of broad spectrum spf 30+ sunscreen to sun-exposed areas.   Hemangiomas - Red papules - Discussed benign nature - Observe - Call for any changes  Actinic Damage - Chronic condition, secondary to  cumulative UV/sun exposure - diffuse scaly erythematous macules with underlying dyspigmentation - Recommend daily broad spectrum sunscreen SPF 30+ to sun-exposed areas, reapply every 2 hours as needed.  - Staying in the shade or wearing long sleeves, sun glasses (UVA+UVB protection) and wide brim hats (4-inch brim around the entire circumference of the hat) are also recommended for sun protection.  - Call for new or changing lesions.  Skin cancer screening performed today.  Dermatofibroma - Firm pink/brown papulenodule with dimple sign at left thigh, left upper back - Benign appearing - Call for any changes   Return in about 1 year (around 02/13/2023) for TBSE, Hx Dysplastic Nevi.  Graciella Belton, RMA, am acting as scribe for Forest Gleason, MD .  Documentation: I have reviewed the above documentation for accuracy and completeness, and I agree with the above.  Forest Gleason, MD

## 2022-02-18 ENCOUNTER — Telehealth: Payer: Self-pay

## 2022-02-18 NOTE — Telephone Encounter (Signed)
Patient advised bx benign growth, no tx needed. Lurlean Horns., RMA

## 2022-02-18 NOTE — Telephone Encounter (Signed)
-----   Message from [place], MD sent at 02/18/2022 10:13 AM EST ----- Skin , left medial knee SEBORRHEIC KERATOSIS, INFLAMED --> This is a benign growth or "wisdom spot". No additional treatment is needed.   MAs please call. Thank you!

## 2022-02-24 ENCOUNTER — Encounter: Payer: Self-pay | Admitting: Dermatology

## 2022-02-25 ENCOUNTER — Ambulatory Visit: Payer: Medicare HMO

## 2022-03-03 ENCOUNTER — Telehealth: Payer: Self-pay | Admitting: Oncology

## 2022-03-03 NOTE — Telephone Encounter (Signed)
Spoke with patient to let her know that unfortunately her PET scan has been denied by insurance. Told patient that we would contact her with any updates or if Dr. Janese Banks wants to order alternative imaging.

## 2022-03-04 ENCOUNTER — Ambulatory Visit: Payer: Medicare HMO | Admitting: Oncology

## 2022-03-04 ENCOUNTER — Other Ambulatory Visit: Payer: Medicare HMO

## 2022-03-09 IMAGING — CT CT ABD-PELV W/ CM
2 of 5 series · 16 of 46 positions shown, 18 images · IV contrast (APPLIED)
Comparison: CT chest 05/04/2018, CT abdomen pelvis 12/20/2003

CLINICAL DATA: Lymphocytosis and back pain with sciatica.

EXAM:
CT ABDOMEN AND PELVIS WITH CONTRAST
TECHNIQUE: Multidetector CT imaging of the abdomen and pelvis was performed
using the standard protocol following bolus administration of
intravenous contrast.
CONTRAST:  100mL OMNIPAQUE IOHEXOL 300 MG/ML  SOLN

[Series 2: axial (person_name) (person_name) · axial · 0.85mm/px · z∈[-336,+89]mm · 13 of 95 slices shown, 15 images]
[im 5/95  soft-tissue]
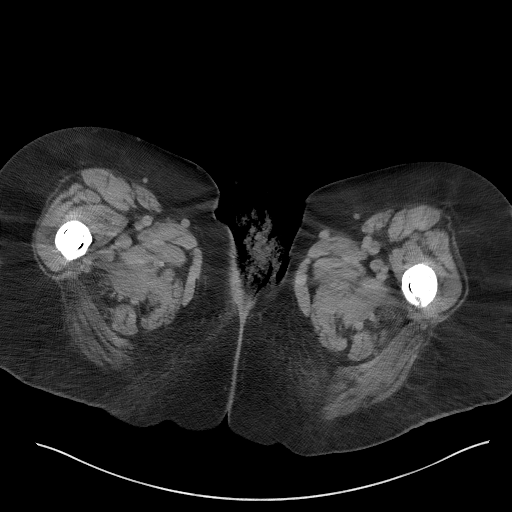
[im 5/95  bone]
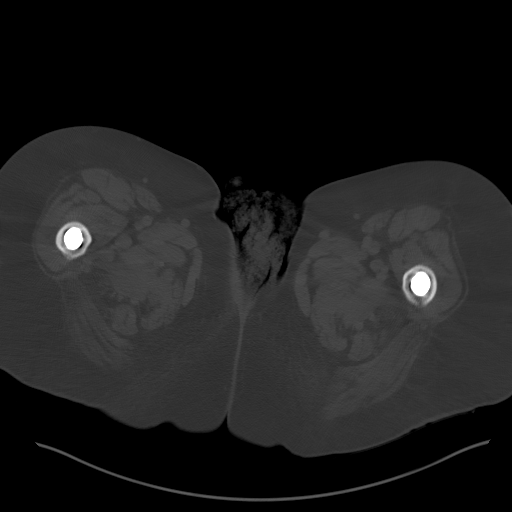
[im 15/95  soft-tissue]
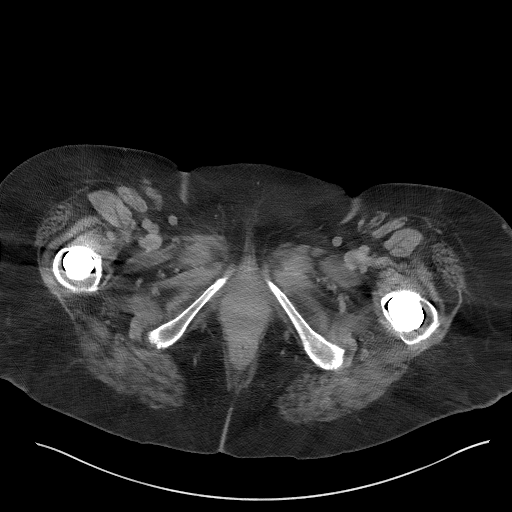
[im 20/95  soft-tissue]
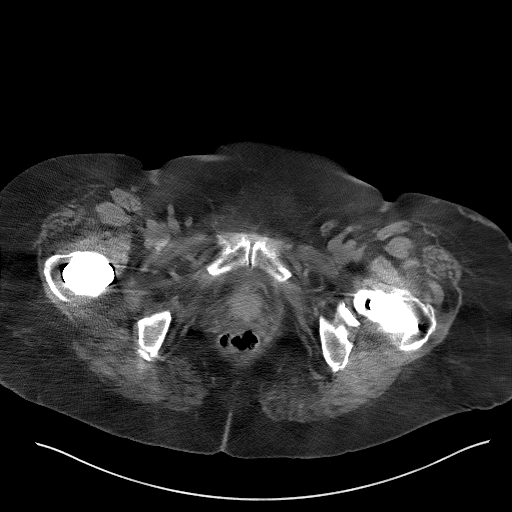
[im 25/95  soft-tissue]
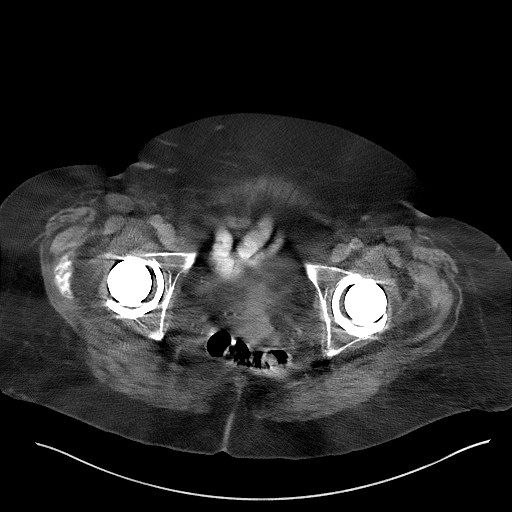
[im 35/95  soft-tissue]
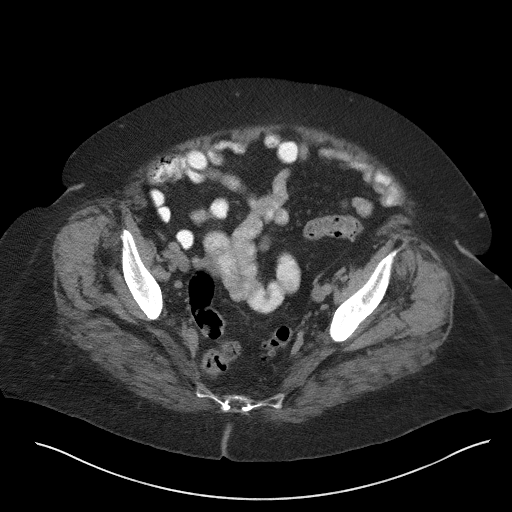
[im 40/95  soft-tissue]
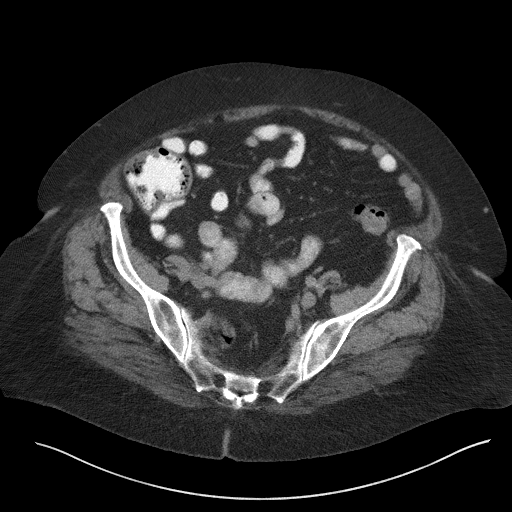
[im 50/95  soft-tissue]
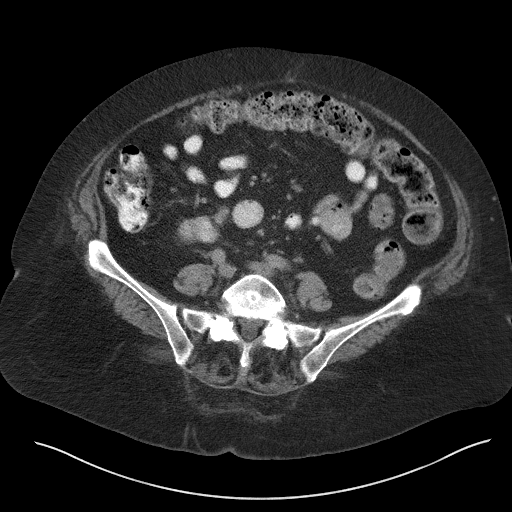
[im 55/95  soft-tissue]
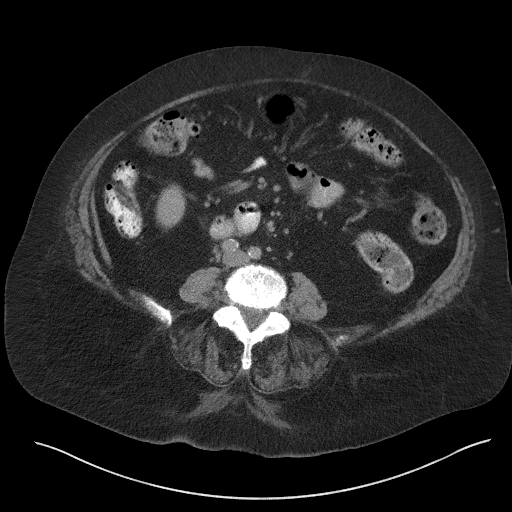
[im 60/95  soft-tissue]
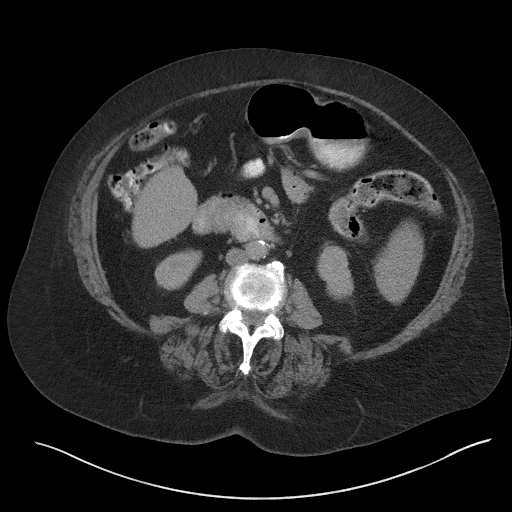
[im 60/95  bone]
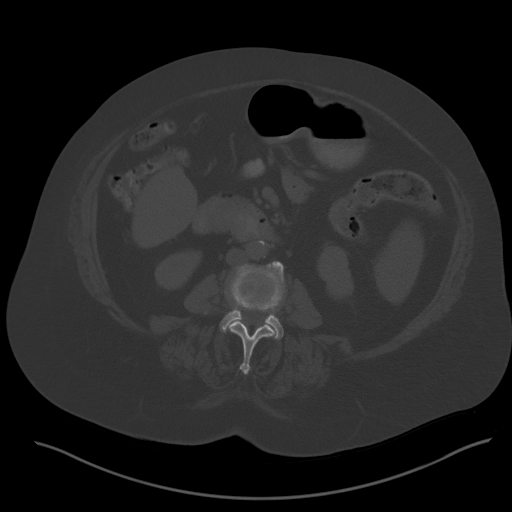
[im 70/95  soft-tissue]
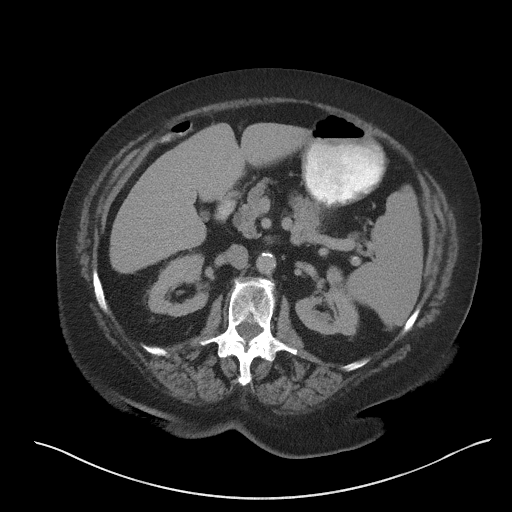
[im 75/95  soft-tissue]
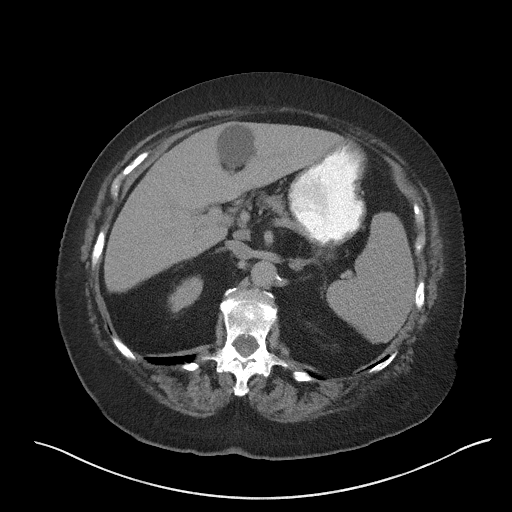
[im 80/95  soft-tissue]
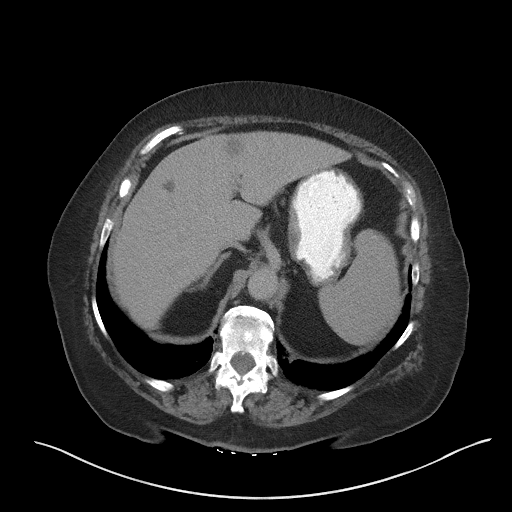
[im 90/95  soft-tissue]
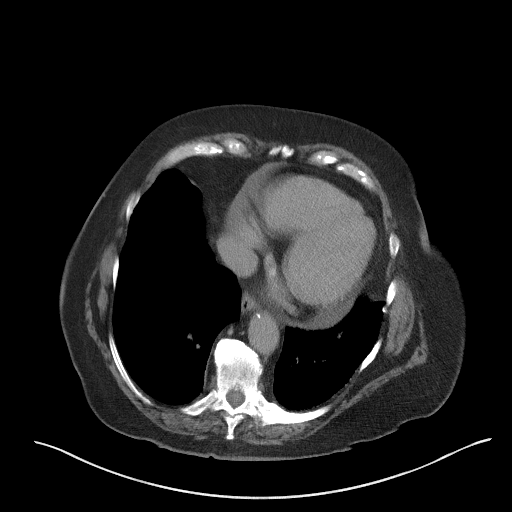

[Series 5: coronal st · coronal · 0.92mm/px · 3 of 118 slices shown]
[im 40/118  soft-tissue]
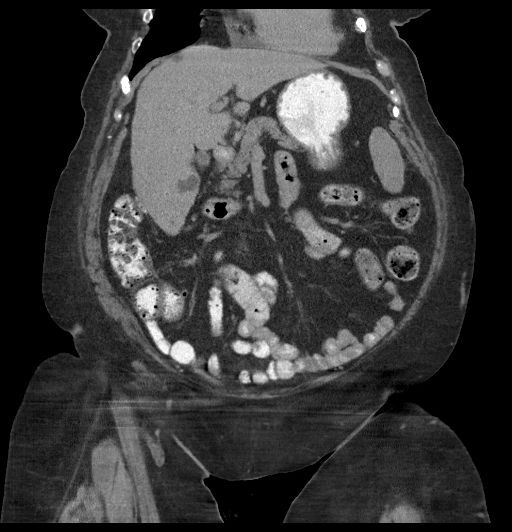
[im 53/118  soft-tissue]
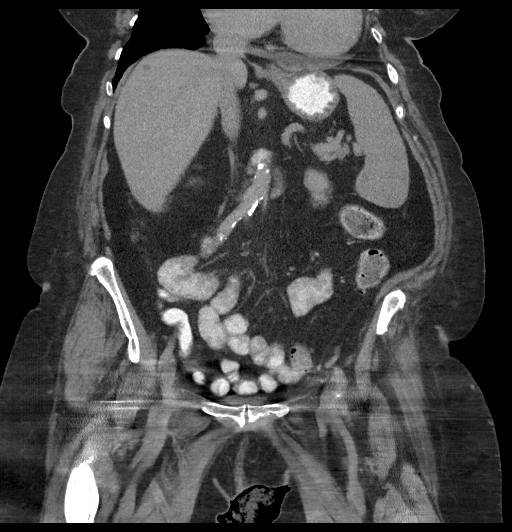
[im 66/118  soft-tissue]
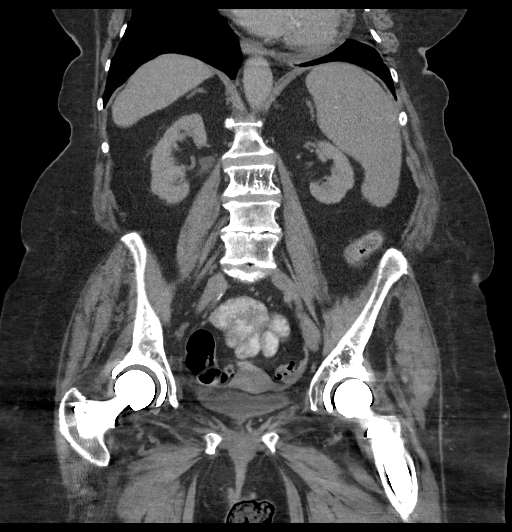

[16 of 46 positions shown; findings below may reference images not displayed]

FINDINGS: Lower chest: Trace pericardial effusion. Elevated left
hemidiaphragm. No acute abnormality.

Hepatobiliary: Several fluid density lesions within the liver likely
represent simple hepatic cyst. Subcentimeter hypodensities are too
small to characterize. No gallstones, gallbladder wall thickening,
or pericholecystic fluid. No biliary dilatation.

Pancreas: No focal lesion. Normal pancreatic contour. No surrounding
inflammatory changes. No main pancreatic ductal dilatation.

Spleen: The spleen is enlarged measuring up to 14 cm. No focal
abnormality.

Adrenals/Urinary Tract:

No adrenal nodule bilaterally.

Bilateral kidneys enhance symmetrically. No hydronephrosis. No
hydroureter.

The urinary bladder is unremarkable.

Stomach/Bowel: PO contrast reaches the large bowel. Stomach is
within normal limits. No evidence of bowel wall thickening or
dilatation. Scattered colonic diverticulosis. Appendix appears
normal.

Vascular/Lymphatic: No abdominal aorta or iliac aneurysm. Mild to
moderate atherosclerotic plaque of the aorta and its branches. No
abdominal, pelvic, or inguinal lymphadenopathy.

Reproductive: The uterus and bilateral adnexal regions are
unremarkable. There is slight arm evaluation due to streak artifact
originating from bilateral femoral surgical hardware.

Other: No intraperitoneal free fluid. No intraperitoneal free gas.
No organized fluid collection.

Musculoskeletal:

No abdominal wall hernia or abnormality.

No suspicious lytic or blastic osseous lesions. No acute displaced
fracture. L3 vertebral body hemangioma. Multilevel degenerative
changes of the spine.

Bilateral total hip arthroplasties are partially visualized.
IMPRESSION: 1. Mild splenomegaly.
2. Multiple fluid density lesions within the liver that likely
represent simple hepatic cysts. Subcentimeter hypodensities are too
small to characterize.
3. Colonic diverticulosis with no acute diverticulitis.
4.  Aortic Atherosclerosis ([id number]).

## 2022-03-12 ENCOUNTER — Ambulatory Visit: Payer: Medicare HMO

## 2022-03-13 ENCOUNTER — Telehealth: Payer: Self-pay | Admitting: *Deleted

## 2022-03-13 NOTE — Telephone Encounter (Signed)
Patient called stating that her insurance has approved her scan and so she is asking if her Monday appointment should be rescheduled and her scan reordered and her see doctor after scan. Please advise

## 2022-03-13 NOTE — Telephone Encounter (Signed)
Yes ideally after scan appt

## 2022-03-16 ENCOUNTER — Inpatient Hospital Stay: Payer: Medicare HMO | Admitting: Oncology

## 2022-03-16 ENCOUNTER — Inpatient Hospital Stay: Payer: Medicare HMO

## 2022-03-24 ENCOUNTER — Ambulatory Visit
Admission: RE | Admit: 2022-03-24 | Discharge: 2022-03-24 | Disposition: A | Discharge status: Home / Self Care | Payer: Medicare HMO | Source: Ambulatory Visit | Attending: Oncology | Admitting: Oncology

## 2022-03-24 DIAGNOSIS — D47Z9 Other specified neoplasms of uncertain behavior of lymphoid, hematopoietic and related tissue: Secondary | ICD-10-CM

## 2022-03-24 DIAGNOSIS — I700 Atherosclerosis of aorta: Secondary | ICD-10-CM | POA: Diagnosis not present

## 2022-03-24 DIAGNOSIS — K7689 Other specified diseases of liver: Secondary | ICD-10-CM | POA: Diagnosis not present

## 2022-03-24 DIAGNOSIS — C8510 Unspecified B-cell lymphoma, unspecified site: Secondary | ICD-10-CM | POA: Insufficient documentation

## 2022-03-24 DIAGNOSIS — I2510 Atherosclerotic heart disease of native coronary artery without angina pectoris: Secondary | ICD-10-CM | POA: Diagnosis not present

## 2022-03-24 DIAGNOSIS — R161 Splenomegaly, not elsewhere classified: Secondary | ICD-10-CM | POA: Diagnosis not present

## 2022-03-24 LAB — GLUCOSE, CAPILLARY: Glucose-Capillary: 99 mg/dL (ref 70–99)

## 2022-03-24 MED ORDER — FLUDEOXYGLUCOSE F - 18 (FDG) INJECTION
11.9000 | Freq: Once | INTRAVENOUS | Status: AC | PRN
  Administered 2022-03-24: 12.35 via INTRAVENOUS

## 2022-03-30 ENCOUNTER — Inpatient Hospital Stay (HOSPITAL_BASED_OUTPATIENT_CLINIC_OR_DEPARTMENT_OTHER): Payer: Medicare HMO | Admitting: Oncology

## 2022-03-30 ENCOUNTER — Other Ambulatory Visit: Payer: Self-pay | Admitting: *Deleted

## 2022-03-30 ENCOUNTER — Encounter: Payer: Self-pay | Admitting: Oncology

## 2022-03-30 ENCOUNTER — Inpatient Hospital Stay: Payer: Medicare HMO | Attending: Oncology

## 2022-03-30 VITALS — BP 102/66 | HR 62 | Temp 95.6°F | Ht 66.0 in | Wt 231.2 lb

## 2022-03-30 DIAGNOSIS — I10 Essential (primary) hypertension: Secondary | ICD-10-CM | POA: Insufficient documentation

## 2022-03-30 DIAGNOSIS — Z800 Family history of malignant neoplasm of digestive organs: Secondary | ICD-10-CM | POA: Insufficient documentation

## 2022-03-30 DIAGNOSIS — E785 Hyperlipidemia, unspecified: Secondary | ICD-10-CM | POA: Diagnosis not present

## 2022-03-30 DIAGNOSIS — D47Z9 Other specified neoplasms of uncertain behavior of lymphoid, hematopoietic and related tissue: Secondary | ICD-10-CM | POA: Diagnosis not present

## 2022-03-30 DIAGNOSIS — R161 Splenomegaly, not elsewhere classified: Secondary | ICD-10-CM | POA: Diagnosis not present

## 2022-03-30 DIAGNOSIS — Z803 Family history of malignant neoplasm of breast: Secondary | ICD-10-CM | POA: Insufficient documentation

## 2022-03-30 DIAGNOSIS — C9110 Chronic lymphocytic leukemia of B-cell type not having achieved remission: Secondary | ICD-10-CM | POA: Diagnosis present

## 2022-03-30 LAB — COMPREHENSIVE METABOLIC PANEL
ALT: 11 U/L (ref 0–44)
AST: 21 U/L (ref 15–41)
Albumin: 3.7 g/dL (ref 3.5–5.0)
Alkaline Phosphatase: 39 U/L (ref 38–126)
Anion gap: 9 (ref 5–15)
BUN: 27 mg/dL — ABNORMAL HIGH (ref 8–23)
CO2: 25 mmol/L (ref 22–32)
Calcium: 8.8 mg/dL — ABNORMAL LOW (ref 8.9–10.3)
Chloride: 102 mmol/L (ref 98–111)
Creatinine, Ser: 1.11 mg/dL — ABNORMAL HIGH (ref 0.44–1.00)
GFR, Estimated: 48 mL/min — ABNORMAL LOW (ref >60)
Glucose, Bld: 120 mg/dL — ABNORMAL HIGH (ref 70–99)
Potassium: 3.7 mmol/L (ref 3.5–5.1)
Sodium: 136 mmol/L (ref 135–145)
Total Bilirubin: 0.5 mg/dL (ref 0.3–1.2)
Total Protein: 6.8 g/dL (ref 6.5–8.1)

## 2022-03-30 LAB — CBC WITH DIFFERENTIAL/PLATELET
Abs Immature Granulocytes: 0.08 10*3/uL — ABNORMAL HIGH (ref 0.00–0.07)
Basophils Absolute: 0.1 10*3/uL (ref 0.0–0.1)
Basophils Relative: 0 %
Eosinophils Absolute: 0.2 10*3/uL (ref 0.0–0.5)
Eosinophils Relative: 1 %
HCT: 38.9 % (ref 36.0–46.0)
Hemoglobin: 12.4 g/dL (ref 12.0–15.0)
Immature Granulocytes: 0 %
Lymphocytes Relative: 74 %
Lymphs Abs: 24.9 10*3/uL — ABNORMAL HIGH (ref 0.7–4.0)
MCH: 29 pg (ref 26.0–34.0)
MCHC: 31.9 g/dL (ref 30.0–36.0)
MCV: 90.9 fL (ref 80.0–100.0)
Monocytes Absolute: 3.6 10*3/uL — ABNORMAL HIGH (ref 0.1–1.0)
Monocytes Relative: 11 %
Neutro Abs: 4.5 10*3/uL (ref 1.7–7.7)
Neutrophils Relative %: 14 %
Platelets: 164 10*3/uL (ref 150–400)
RBC: 4.28 MIL/uL (ref 3.87–5.11)
RDW: 14.9 % (ref 11.5–15.5)
Smear Review: NORMAL
WBC: 33.3 10*3/uL — ABNORMAL HIGH (ref 4.0–10.5)
nRBC: 0 % (ref 0.0–0.2)

## 2022-03-30 NOTE — Progress Notes (Signed)
Patient states that she wants to go over everything with the provider.

## 2022-03-30 NOTE — Progress Notes (Signed)
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  Most recently her CBC showed white count of 26.4, H&H of 14.8/46.1 and platelet count of 159.  Differential mainly showed lymphocytosis with some basophilia and monocytosis.  Patient has had chronic leukocytosis/lymphocytosis at least dating back to 2017.  Hemoglobin and platelets have always been normal.  Differential in the past also has shown mainly lymphocytosis and some monocytosis.  Patient presently complains of pain in her right paraspinal area.  Reports that the pain worsens when she moves around or bends.  She had a CT abdomen and pelvis with contrast done a couple of days ago which showed liver cysts and mild splenomegaly of 14 cm.  No intra-abdominal adenopathy no other acute intra-abdominal pathology.  Patient reports that her appetite and weight have remained stable.    Results of blood work from 05/06/2020 were as follows: CBC showed white cell count of 29.3, H&H of 14.5/45.4 with a platelet count of 162.  Differential mainly showed lymphocytosis with an absolute lymphocyte count of 20 with some monocytosis and basophilia.  BCR ABL FISH testing was negative.  Smear review showed monomorphic  lymphoid cells which were large with abundant cytoplasm.  Morphology indicative of a lymphoid neoplasm.  Differentiations include CLL with variant morphology, leukemic phase of lymphoma such as mantle cell lymphoma or prolymphocytic leukemia.  Flow cytometry showed CD5 negative CD10 negative and CD 103 - B-cell lymphoproliferative disorder.  Phenotype effect.  Typical phenotype for CLL, mantle cell, hairy cell or follicular center cell lymphoma not detected.  Patient has not had a bone marrow biopsy and given the stability of her counts      Interval history-patient is doing well presently.  Appetite and weight have remained stable and denies any specific complaints at this time.  ECOG PS- 1 Pain scale- 0 O  Review of systems- Review of Systems  Constitutional:  Negative for chills, fever, malaise/fatigue and weight loss.  HENT:  Negative for congestion, ear discharge and nosebleeds.   Eyes:  Negative for blurred vision.  Respiratory:  Negative for cough, hemoptysis, sputum production, shortness of breath and wheezing.   Cardiovascular:  Negative for chest pain, palpitations, orthopnea and claudication.  Gastrointestinal:  Negative for abdominal pain, blood in stool, constipation, diarrhea, heartburn, melena, nausea and vomiting.  Genitourinary:  Negative for dysuria, flank pain, frequency, hematuria and urgency.  Musculoskeletal:  Negative for back pain, joint pain and myalgias.  Skin:  Negative for rash.  Neurological:  Negative for dizziness, tingling, focal weakness, seizures, weakness and headaches.  Endo/Heme/Allergies:  Does not bruise/bleed easily.  Psychiatric/Behavioral:  Negative for depression and suicidal ideas. The patient does not have insomnia.       Allergies  Allergen Reactions   Sulfa Antibiotics Nausea And Vomiting and Rash     Past Medical History:  Diagnosis Date   Atypical mole 07/07/2017  right upper back/severe. excision 07/07/2017. 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)    Urge incontinence      Past Surgical History:  Procedure Laterality Date   BREAST CYST EXCISION     CATARACT EXTRACTION     hip replacment     REPLACEMENT TOTAL KNEE      Social History   Socioeconomic History   Marital status: Widowed    Spouse name: Not on file   Number of children: Not on file   Years of education: Not on file   Highest education level: Not on file  Occupational History   Not on file  Tobacco Use   Smoking status: Never   Smokeless tobacco: Never  Vaping Use   Vaping Use: Never used  Substance and Sexual Activity   Alcohol use: Never   Drug use: Never   Sexual activity: Not Currently  Other Topics Concern   Not on file  Social History Narrative   Not on file   Social Determinants of Health   Financial Resource Strain: Not on file  Food Insecurity: Not on file  Transportation Needs: Not on file  Physical Activity: Not on file  Stress: Not on file  Social Connections: Not on file  Intimate Partner Violence: Not on file    Family History  Problem Relation Age of Onset   Hypertension Mother    Colon cancer Mother    Breast cancer Mother    Aortic aneurysm Father    Pancreatic cancer Brother    Colon cancer Brother    Heart disease Brother    Heart Problems Brother      Current Outpatient Medications:    amLODipine (NORVASC) 5 MG tablet, Take 5 mg by mouth daily., Disp: , Rfl:    carvedilol (COREG) 25 MG tablet, Take 25 mg by mouth 2 (two) times daily with a meal., Disp: , Rfl:    Cranberry 500 MG CAPS, Take 1 tablet by mouth daily. , Disp: , Rfl:    losartan-hydrochlorothiazide (HYZAAR) 100-12.5 MG tablet, Take 1 tablet by mouth daily., Disp: , Rfl:    pravastatin (PRAVACHOL) 40 MG tablet, Take 40 mg by mouth daily., Disp: , Rfl:    spironolactone (ALDACTONE) 25 MG  tablet, Take 25 mg by mouth daily., Disp: , Rfl:   Physical exam:  Vitals:   03/30/22 1323  BP: 102/66  Pulse: 62  Temp: (!) 95.6 F (35.3 C)  TempSrc: Tympanic  SpO2: 95%  Weight: 231 lb 3.2 oz (104.9 kg)  Height: 5' 6"$  (1.676 m)   Physical Exam Cardiovascular:     Rate and Rhythm: Normal rate and regular rhythm.     Heart sounds: Normal heart sounds.  Pulmonary:     Effort: Pulmonary effort is normal.     Breath sounds: Normal breath sounds.  Abdominal:     General: Bowel sounds are normal.     Palpations: Abdomen is soft.  Skin:    General: Skin is warm and dry.  Neurological:     Mental Status: She is alert and oriented to person, place, and time.         Latest Ref Rng & Units 04/21/2021    1:54 PM  CMP  Glucose 70 - 99 mg/dL 99   BUN 8 - 23 mg/dL 16   Creatinine 0.44 -  1.00 mg/dL 1.00   Sodium 135 - 145 mmol/L 136   Potassium 3.5 - 5.1 mmol/L 4.1   Chloride 98 - 111 mmol/L 102   CO2 22 - 32 mmol/L 28   Calcium 8.9 - 10.3 mg/dL 8.5   Total Protein 6.5 - 8.1 g/dL 7.1   Total Bilirubin 0.3 - 1.2 mg/dL 0.8   Alkaline Phos 38 - 126 U/L 44   AST 15 - 41 U/L 18   ALT 0 - 44 U/L 9       Latest Ref Rng & Units 10/23/2021    8:45 AM  CBC  WBC 4.0 - 10.5 K/uL 32.5   Hemoglobin 12.0 - 15.0 g/dL 13.4   Hematocrit 36.0 - 46.0 % 41.1   Platelets 150 - 400 K/uL 150     No images are attached to the encounter.  NM PET Image Restag (PS) Skull Base To Thigh  Result Date: 03/25/2022 CLINICAL DATA:  Subsequent treatment strategy for low-grade B-cell lymphoma. EXAM: NUCLEAR MEDICINE PET SKULL BASE TO THIGH TECHNIQUE: 12.4 mCi F-18 FDG was injected intravenously. Full-ring PET imaging was performed from the skull base to thigh after the radiotracer. CT data was obtained and used for attenuation correction and anatomic localization. Fasting blood glucose: 99 mg/dl COMPARISON:  Head CT dated 05/30/2020 FINDINGS: Mediastinal blood pool activity: SUV max 2.2 Liver activity:  SUV max 3.2 NECK: No hypermetabolic cervical lymphadenopathy. Incidental CT findings: None. CHEST: No hypermetabolic thoracic lymphadenopathy. No suspicious pulmonary nodules. Incidental CT findings: Mild coronary atherosclerosis of the LAD. Mild atherosclerotic calcifications of the aortic arch. ABDOMEN/PELVIS: Mild splenomegaly. No focal abnormal metabolism in the spleen. Reference max SUV 3.8. No abnormal metabolism in the liver, pancreas, or adrenal glands. No hypermetabolic abdominopelvic lymphadenopathy. Incidental CT findings: Scattered hepatic cysts. Atherosclerotic calcifications abdominal aorta and branch vessels. SKELETON: No focal hypermetabolic activity to suggest skeletal metastasis. Incidental CT findings: Degenerative changes of the visualized thoracolumbar spine. Bilateral hip arthroplasties. IMPRESSION: Mild splenomegaly, but without focal hypermetabolism. Otherwise, no findings to suggest active lymphoma. Specifically, no suspicious lymphadenopathy in the neck, chest, abdomen, or pelvis. Electronically Signed   By: Julian Hy M.D.   On: 03/25/2022 08:45     Assessment and plan- Patient is a 89 y.o. female with history of low-grade B-cell lymphoproliferative disorder here for routine follow-up  Patient has had leukocytosis mainly lymphocytosis and monocytosis at least dating back to 2017.  There has been a gradual increase in her white cell count which was close to 14 in 2020.  In 2022 it was between 20-26.  Over the last 1 year it is now in the 61s.  Hemoglobin and platelets are normal.   I have reviewed PET CT scan images independently and discussed findings with the patient.  PET scan did not show any evidence of adenopathy.  Mild splenomegaly with SUV of 3.8 was noted.  I suspect patient has a low-grade B-cell lymphoproliferative disorder which was also found on her flow cytometry which showed a CD5 negative CD10 negative and CD103 negative B-cell lymphoproliferative disorder or.   The phenotype is nonspecific.  I am repeating her flow cytometry at this time.  Given her age I am planning to monitor this conservatively without the need for bone marrow biopsy or treatment at this time   Visit Diagnosis 1. 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 Randa Evens, MD, MPH [facility] at [facility] [id number] 03/30/2022 1:05 PM

## 2022-04-01 LAB — COMP PANEL: LEUKEMIA/LYMPHOMA: Immunophenotypic Profile: 61

## 2022-04-02 ENCOUNTER — Telehealth: Payer: Self-pay | Admitting: *Deleted

## 2022-04-02 NOTE — Telephone Encounter (Signed)
Iread over and called the patient and I got the voicemail and left her the message that the repeat tests does not show concern for progression and also no aggressive lymphoma. Your white count WBC is up from a slow growing lymphoma which is not causing issues right now. No need for Xray at this time and keep th appt that is as scheduled already. Call if you have questions to call [phone]

## 2022-04-02 NOTE — Telephone Encounter (Signed)
-----   Message from Sindy Guadeloupe, MD sent at 04/02/2022  9:50 AM EST ----- Please let her know that her repeat tests dont show any concern for progression to aggressive lymphoma.. her high wbc is from her slow growing lymphoma which is not causing any problems. No need for ay rx at this time. Keep f/u as scheduled

## 2022-06-29 ENCOUNTER — Inpatient Hospital Stay: Payer: Medicare HMO | Attending: Oncology

## 2022-06-29 DIAGNOSIS — D47Z9 Other specified neoplasms of uncertain behavior of lymphoid, hematopoietic and related tissue: Secondary | ICD-10-CM | POA: Insufficient documentation

## 2022-06-29 LAB — CBC WITH DIFFERENTIAL/PLATELET
Abs Immature Granulocytes: 0 10*3/uL (ref 0.00–0.07)
Basophils Absolute: 0 10*3/uL (ref 0.0–0.1)
Basophils Relative: 0 %
Eosinophils Absolute: 0 10*3/uL (ref 0.0–0.5)
Eosinophils Relative: 0 %
HCT: 41 % (ref 36.0–46.0)
Hemoglobin: 13.2 g/dL (ref 12.0–15.0)
Lymphocytes Relative: 75 %
Lymphs Abs: 27.4 10*3/uL — ABNORMAL HIGH (ref 0.7–4.0)
MCH: 29.3 pg (ref 26.0–34.0)
MCHC: 32.2 g/dL (ref 30.0–36.0)
MCV: 91.1 fL (ref 80.0–100.0)
Monocytes Absolute: 0.4 10*3/uL (ref 0.1–1.0)
Monocytes Relative: 1 %
Neutro Abs: 8.8 10*3/uL — ABNORMAL HIGH (ref 1.7–7.7)
Neutrophils Relative %: 24 %
Platelets: 157 10*3/uL (ref 150–400)
RBC: 4.5 MIL/uL (ref 3.87–5.11)
RDW: 14.4 % (ref 11.5–15.5)
Smear Review: NORMAL
WBC: 36.5 10*3/uL — ABNORMAL HIGH (ref 4.0–10.5)
nRBC: 0 % (ref 0.0–0.2)

## 2022-09-28 ENCOUNTER — Inpatient Hospital Stay: Payer: Medicare HMO | Attending: Oncology

## 2022-09-28 ENCOUNTER — Inpatient Hospital Stay (HOSPITAL_BASED_OUTPATIENT_CLINIC_OR_DEPARTMENT_OTHER): Payer: Medicare HMO | Admitting: Oncology

## 2022-09-28 ENCOUNTER — Encounter: Payer: Self-pay | Admitting: Oncology

## 2022-09-28 VITALS — BP 119/61 | HR 65 | Temp 95.0°F | Resp 18 | Ht 66.0 in | Wt 229.9 lb

## 2022-09-28 DIAGNOSIS — I110 Hypertensive heart disease with heart failure: Secondary | ICD-10-CM | POA: Diagnosis not present

## 2022-09-28 DIAGNOSIS — Z79899 Other long term (current) drug therapy: Secondary | ICD-10-CM | POA: Insufficient documentation

## 2022-09-28 DIAGNOSIS — D47Z9 Other specified neoplasms of uncertain behavior of lymphoid, hematopoietic and related tissue: Secondary | ICD-10-CM | POA: Diagnosis present

## 2022-09-28 DIAGNOSIS — D479 Neoplasm of uncertain behavior of lymphoid, hematopoietic and related tissue, unspecified: Secondary | ICD-10-CM

## 2022-09-28 DIAGNOSIS — E785 Hyperlipidemia, unspecified: Secondary | ICD-10-CM | POA: Diagnosis not present

## 2022-09-28 LAB — CBC WITH DIFFERENTIAL/PLATELET
Abs Immature Granulocytes: 0.11 10*3/uL — ABNORMAL HIGH (ref 0.00–0.07)
Basophils Absolute: 0.1 10*3/uL (ref 0.0–0.1)
Basophils Relative: 0 %
Eosinophils Absolute: 0.3 10*3/uL (ref 0.0–0.5)
Eosinophils Relative: 1 %
HCT: 39.2 % (ref 36.0–46.0)
Hemoglobin: 12.6 g/dL (ref 12.0–15.0)
Immature Granulocytes: 0 %
Lymphocytes Relative: 74 %
Lymphs Abs: 30.6 10*3/uL — ABNORMAL HIGH (ref 0.7–4.0)
MCH: 29.5 pg (ref 26.0–34.0)
MCHC: 32.1 g/dL (ref 30.0–36.0)
MCV: 91.8 fL (ref 80.0–100.0)
Monocytes Absolute: 4.5 10*3/uL — ABNORMAL HIGH (ref 0.1–1.0)
Monocytes Relative: 11 %
Neutro Abs: 5.6 10*3/uL (ref 1.7–7.7)
Neutrophils Relative %: 14 %
Platelets: 161 10*3/uL (ref 150–400)
RBC: 4.27 MIL/uL (ref 3.87–5.11)
RDW: 15 % (ref 11.5–15.5)
Smear Review: NORMAL
WBC: 41.2 10*3/uL — ABNORMAL HIGH (ref 4.0–10.5)
nRBC: 0 % (ref 0.0–0.2)

## 2022-09-29 NOTE — Progress Notes (Signed)
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  Most recently her CBC showed white count of 26.4, H&H of 14.8/46.1 and platelet count of 159.  Differential mainly showed lymphocytosis with some basophilia and monocytosis.  Patient has had chronic leukocytosis/lymphocytosis at least dating back to 2017.  Hemoglobin and platelets have always been normal.  Differential in the past also has shown mainly lymphocytosis and some monocytosis.  Patient presently complains of pain in her right paraspinal area.  Reports that the pain worsens when she moves around or bends.  She had a CT abdomen and pelvis with contrast done a couple of days ago which showed liver cysts and mild splenomegaly of 14 cm.  No intra-abdominal adenopathy no other acute intra-abdominal pathology.  Patient reports that her appetite and weight have remained stable.    Results of blood work from 05/06/2020 were as follows: CBC showed white cell count of 29.3, H&H of 14.5/45.4 with a platelet count of 162.  Differential mainly showed lymphocytosis with an absolute lymphocyte count of 20 with some monocytosis and basophilia.  BCR ABL FISH testing was negative.  Smear review  showed monomorphic lymphoid cells which were large with abundant cytoplasm.  Morphology indicative of a lymphoid neoplasm.  Differentiations include CLL with variant morphology, leukemic phase of lymphoma such as mantle cell lymphoma or prolymphocytic leukemia.  Flow cytometry showed CD5 negative CD10 negative and CD 103 - B-cell lymphoproliferative disorder. Typical phenotype for CLL, mantle cell, hairy cell or follicular center cell lymphoma not detected.  Patient has not had a bone marrow biopsy and given the stability of her counts  Interval history-patient is doing well for her age and denies any specific complaints at this time.  Appetite and weight have remained stable.  Denies any new lumps or bumps anywhere  ECOG PS- 1 Pain scale- 0   Review of systems- Review of Systems  Constitutional:  Negative for chills, fever, malaise/fatigue and weight loss.  HENT:  Negative for congestion, ear discharge and nosebleeds.   Eyes:  Negative for blurred vision.  Respiratory:  Negative for cough, hemoptysis, sputum production, shortness of breath and wheezing.   Cardiovascular:  Negative for chest pain, palpitations, orthopnea and claudication.  Gastrointestinal:  Negative for abdominal pain, blood in stool, constipation, diarrhea, heartburn, melena, nausea and vomiting.  Genitourinary:  Negative for dysuria, flank pain, frequency, hematuria and urgency.  Musculoskeletal:  Negative for back pain, joint pain and myalgias.  Skin:  Negative for rash.  Neurological:  Negative for dizziness, tingling, focal weakness, seizures, weakness and headaches.  Endo/Heme/Allergies:  Does not bruise/bleed easily.  Psychiatric/Behavioral:  Negative for depression and suicidal ideas. The patient does not have insomnia.       Allergies  Allergen Reactions   Sulfa Antibiotics Nausea And Vomiting and Rash     Past Medical History:  Diagnosis Date  Atypical mole 07/07/2017   right upper back/severe. excision  07/07/2017. 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)    Urge incontinence      Past Surgical History:  Procedure Laterality Date   BREAST CYST EXCISION     CATARACT EXTRACTION     hip replacment     REPLACEMENT TOTAL KNEE      Social History   Socioeconomic History   Marital status: Widowed    Spouse name: Not on file   Number of children: Not on file   Years of education: Not on file   Highest education level: Not on file  Occupational History   Not on file  Tobacco Use   Smoking status: Never   Smokeless tobacco: Never  Vaping Use   Vaping status: Never Used  Substance and Sexual Activity   Alcohol use: Never   Drug use: Never   Sexual activity: Not Currently  Other Topics Concern   Not on file  Social History Narrative   Not on file   Social Determinants of Health   Financial Resource Strain: Low Risk  (07/21/2022)   Received from [facility]   Overall Financial Resource Strain (CARDIA)    Difficulty of Paying Living Expenses: Not hard at all  Food Insecurity: No Food Insecurity (07/21/2022)   Received from [facility]   Hunger Vital Sign    Worried About Running Out of Food in the Last Year: Never true    Ran Out of Food in the Last Year: Never true  Transportation Needs: No Transportation Needs (07/21/2022)   Received from [facility] - Transportation    In the past 12 months, has lack of transportation kept you from medical appointments or from getting medications?: No    Lack of Transportation (Non-Medical): No  Physical Activity: Not on file  Stress: Not on file  Social Connections: Not on file  Intimate Partner Violence: Not on file    Family History  Problem Relation Age of Onset   Hypertension Mother    Colon cancer Mother    Breast cancer  Mother    Aortic aneurysm Father    Pancreatic cancer Brother    Colon cancer Brother    Heart disease Brother    Heart Problems Brother      Current Outpatient Medications:    amLODipine (NORVASC) 5 MG tablet, Take 5 mg by mouth daily., Disp: , Rfl:    carvedilol (COREG) 25 MG tablet, Take 25 mg by mouth 2 (two) times daily with a meal., Disp: , Rfl:    Cranberry 500 MG CAPS, Take 1 tablet by mouth daily. , Disp: , Rfl:    losartan-hydrochlorothiazide (HYZAAR) 100-12.5 MG tablet, Take 1 tablet by mouth daily., Disp: , Rfl:    pravastatin (PRAVACHOL) 40 MG tablet, Take 40 mg by mouth daily., Disp: , Rfl:    spironolactone (ALDACTONE) 25 MG tablet, Take 25 mg by mouth daily., Disp: , Rfl:   Physical exam:  Vitals:   09/28/22 1308  BP: 119/61  Pulse: 65  Resp: 18  Temp: (!) 95 F (35 C)  TempSrc: Tympanic  SpO2: 95%  Weight: 229 lb 14.4 oz (104.3 kg)  Height: 5\' 6"  (1.676 m)   Physical Exam Cardiovascular:     Rate and Rhythm:  Normal rate and regular rhythm.     Heart sounds: Normal heart sounds.  Pulmonary:     Effort: Pulmonary effort is normal.     Breath sounds: Normal breath sounds.  Abdominal:     General: Bowel sounds are normal.     Palpations: Abdomen is soft.     Comments: No palpable hepatosplenomegaly  Lymphadenopathy:     Comments: No palpable cervical, supraclavicular, axillary or inguinal adenopathy    Skin:    General: Skin is warm and dry.  Neurological:     Mental Status: She is alert and oriented to person, place, and time.         Latest Ref Rng & Units 03/30/2022   12:42 PM  CMP  Glucose 70 - 99 mg/dL 161   BUN 8 - 23 mg/dL 27   Creatinine 0.96 - 1.00 mg/dL 0.45   Sodium 409 - 811 mmol/L 136   Potassium 3.5 - 5.1 mmol/L 3.7   Chloride 98 - 111 mmol/L 102   CO2 22 - 32 mmol/L 25   Calcium 8.9 - 10.3 mg/dL 8.8   Total Protein 6.5 - 8.1 g/dL 6.8   Total Bilirubin 0.3 - 1.2 mg/dL 0.5   Alkaline Phos 38 - 126 U/L 39   AST 15 - 41 U/L 21    ALT 0 - 44 U/L 11       Latest Ref Rng & Units 09/28/2022   12:54 PM  CBC  WBC 4.0 - 10.5 K/uL 41.2   Hemoglobin 12.0 - 15.0 g/dL 91.4   Hematocrit 78.2 - 46.0 % 39.2   Platelets 150 - 400 K/uL 161      Assessment and plan- Patient is a 89 y.o. female with history of low-grade B-cell lymphoproliferative disorder here For routine follow-up  Flow cytometry in February 2024 showed mature B-cell neoplasm but the phenotype was nonspecific.  Potential differential diagnosis was CLL versus mantle cell versus hairy cell leukemia.  She had a PET CT scan in February 2024 which did not show any evidence of adenopathy or hepatosplenomegaly.  There has been a slow but gradual increase in her white cell count and lymphocytosis over the last 2 years.  No other cytopenias or B symptoms.  I would like to get a bone marrow biopsy at this Time to see if we can ascertain if this is atypical CLL versus other etiology such as mantle cell or prolymphocytic leukemia.  Patient would like to wait for her niece to come back in October before getting her bone marrow biopsy I think it would be reasonable to wait until then.  I will tentatively see her back in 2 months to discuss the results of bone marrow biopsy and further management   Visit Diagnosis 1. 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 Lymphoproliferative disorder (HCC)      Dr. Owens Shark, MD, MPH [facility] at [facility] [phone] 09/29/2022 9:01 AM

## 2022-11-19 ENCOUNTER — Telehealth: Payer: Self-pay | Admitting: *Deleted

## 2022-11-19 NOTE — Telephone Encounter (Signed)
Pt was seen in Aug. Dr Smith Robert wanted her to have a bone marrow bx. The pt.was agreeable to have it but she wanted to wait for her daughter to come back in October. We have sent request to have it on 10/21 arrival 8:30 at the heart and  vascular in the front the [facility]. Will need to stop eating or drinking 8 hours before the procedure. If you have any meds that am then please drink sip of water to take the med. You will need a driver to take pt. Home due to the conscious sedation for the procedure. I left [phone] if this date is not ok with her.

## 2022-11-27 ENCOUNTER — Other Ambulatory Visit (HOSPITAL_COMMUNITY): Payer: Self-pay | Admitting: Radiology

## 2022-11-27 DIAGNOSIS — D47Z9 Other specified neoplasms of uncertain behavior of lymphoid, hematopoietic and related tissue: Secondary | ICD-10-CM

## 2022-11-27 NOTE — Progress Notes (Signed)
Patient for IR Bone Marrow Biopsy on Mon 11/30/2022, I called and spoke with the patient on the phone and gave pre-procedure instructions. Pt was made aware to be here at 8:30a, NPO after MN prior to procedure as well as driver post procedure/recovery/discharge. Pt stated understanding.  Called 11/27/2022

## 2022-11-28 NOTE — H&P (Signed)
Chief Complaint: Patient was seen in consultation today for bone marrow biopsy with aspiration.   Referring Physician(s): Creig Hines  Supervising Physician: Malachy Moan  Patient Status: [facility] - Out-pt  History of Present Illness: Lori Browning is a 89 y.o. female with a medical history significant for CHF, COPD, HTN and chronic leukocytosis dating back to at least 2017. She is followed by Hematology/Oncology and has been managed conservatively for several years. Repeat Flow cytometry February 2024 showed mature B-cell neoplasm but the phenotype was non-specific. Her hematology team recommended a bone marrow biopsy but the patient requested to wait until October when the patient's niece returned.   Interventional Radiology has been asked to evaluate this patient for an image-guided bone marrow biopsy with aspiration for further work up.   Past Medical History:  Diagnosis Date   Atypical mole 07/07/2017   right upper back/severe. excision 07/07/2017. 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 Authorizing Provider  amLODipine (NORVASC) 5 MG tablet Take 5 mg by mouth daily.    [provider]  carvedilol (COREG) 25 MG tablet Take 25 mg by mouth 2 (two) times daily with a meal.    [provider]  Cranberry 500 MG CAPS Take 1 tablet by mouth daily.     [provider]  losartan-hydrochlorothiazide (HYZAAR) 100-12.5 MG tablet Take 1 tablet by mouth daily.    [provider]  pravastatin (PRAVACHOL) 40  MG tablet Take 40 mg by mouth daily. 10/09/11   [provider]  spironolactone (ALDACTONE) 25 MG tablet Take 25 mg by mouth daily. 09/02/20   [provider]     Family History  Problem Relation Age of Onset   Hypertension Mother    Colon cancer Mother    Breast cancer Mother    Aortic aneurysm Father    Pancreatic cancer Brother    Colon cancer Brother    Heart disease Brother    Heart Problems Brother     Social History   Socioeconomic History   Marital status: Widowed    Spouse name: Not on file   Number of children: Not on file   Years of education: Not on file   Highest education level: Not on file  Occupational History   Not on file  Tobacco Use   Smoking status: Never   Smokeless tobacco: Never  Vaping Use   Vaping status: Never Used  Substance and Sexual Activity   Alcohol use: Never   Drug use: Never   Sexual activity: Not Currently  Other Topics Concern   Not on file  Social History Narrative   Not on file   Social Determinants of Health   Financial Resource Strain: Low Risk  (07/21/2022)   Received from [facility]   Overall Financial Resource Strain (CARDIA)    Difficulty of Paying Living Expenses: Not hard at all  Food Insecurity: No Food Insecurity (07/21/2022)   Received from [facility]  Hunger Vital Sign    Worried About Running Out of Food in the Last Year: Never true    Ran Out of Food in the Last Year: Never true  Transportation Needs: No Transportation Needs (07/21/2022)   Received from [facility] - Transportation    In the past 12 months, has lack of transportation kept you from medical appointments or from getting medications?: No    Lack of Transportation (Non-Medical): No  Physical Activity: Not on file  Stress: Not on file  Social Connections: Not on file    Review of Systems: A 12 point ROS discussed and pertinent positives are indicated in the HPI  above.  All other systems are negative.  Review of Systems  Vital Signs: There were no vitals taken for this visit.  Physical Exam  Imaging: No results found.  Labs:  CBC: Recent Labs    03/30/22 1242 06/29/22 1438 09/28/22 1254  WBC 33.3* 36.5* 41.2*  HGB 12.4 13.2 12.6  HCT 38.9 41.0 39.2  PLT 164 157 161    COAGS: No results for input(s): "INR", "APTT" in the last 8760 hours.  BMP: Recent Labs    03/30/22 1242  NA 136  K 3.7  CL 102  CO2 25  GLUCOSE 120*  BUN 27*  CALCIUM 8.8*  CREATININE 1.11*  GFRNONAA 48*    LIVER FUNCTION TESTS: Recent Labs    03/30/22 1242  BILITOT 0.5  AST 21  ALT 11  ALKPHOS 39  PROT 6.8  ALBUMIN 3.7    TUMOR MARKERS: No results for input(s): "AFPTM", "CEA", "CA199", "CHROMGRNA" in the last 8760 hours.  Assessment and Plan:  B- cell lymphoproliferative disorder: Lori S. 2, 89 year old female, presents today to the [facility] Interventional Radiology department for an image-guided bone marrow biopsy with aspiration.  Risks and benefits of this procedure were discussed with the patient and/or patient's family including, but not limited to bleeding, infection, damage to adjacent structures or low yield requiring additional tests.  All of the questions were answered and there is agreement to proceed. She has been NPO. She is a full code.   Consent signed and in chart.  Thank you for this interesting consult.  I greatly enjoyed meeting Lori Browning and look forward to participating in their care.  A copy of this report was sent to the requesting provider on this date.  Electronically Signed: Alwyn Ren, AGACNP-BC [phone] 11/28/2022, 1:13 PM   I spent a total of  30 Minutes   in face to face in clinical consultation, greater than 50% of which was counseling/coordinating care for bone marrow biopsy with aspiration.

## 2022-11-30 ENCOUNTER — Ambulatory Visit
Admission: RE | Admit: 2022-11-30 | Discharge: 2022-11-30 | Disposition: A | Discharge status: Home / Self Care | Payer: Medicare HMO | Source: Ambulatory Visit | Attending: Oncology | Admitting: Radiology

## 2022-11-30 DIAGNOSIS — D47Z9 Other specified neoplasms of uncertain behavior of lymphoid, hematopoietic and related tissue: Secondary | ICD-10-CM

## 2022-11-30 DIAGNOSIS — I110 Hypertensive heart disease with heart failure: Secondary | ICD-10-CM | POA: Insufficient documentation

## 2022-11-30 DIAGNOSIS — Z1379 Encounter for other screening for genetic and chromosomal anomalies: Secondary | ICD-10-CM | POA: Insufficient documentation

## 2022-11-30 DIAGNOSIS — I509 Heart failure, unspecified: Secondary | ICD-10-CM | POA: Insufficient documentation

## 2022-11-30 DIAGNOSIS — D479 Neoplasm of uncertain behavior of lymphoid, hematopoietic and related tissue, unspecified: Secondary | ICD-10-CM | POA: Diagnosis present

## 2022-11-30 DIAGNOSIS — J449 Chronic obstructive pulmonary disease, unspecified: Secondary | ICD-10-CM | POA: Insufficient documentation

## 2022-11-30 HISTORY — PX: IR BONE MARROW BIOPSY & ASPIRATION: IMG5727

## 2022-11-30 LAB — CBC WITH DIFFERENTIAL/PLATELET
Abs Immature Granulocytes: 0.12 10*3/uL — ABNORMAL HIGH (ref 0.00–0.07)
Basophils Absolute: 0.1 10*3/uL (ref 0.0–0.1)
Basophils Relative: 0 %
Eosinophils Absolute: 0.3 10*3/uL (ref 0.0–0.5)
Eosinophils Relative: 1 %
HCT: 41.4 % (ref 36.0–46.0)
Hemoglobin: 13.3 g/dL (ref 12.0–15.0)
Immature Granulocytes: 0 %
Lymphocytes Relative: 79 %
Lymphs Abs: 37.7 10*3/uL — ABNORMAL HIGH (ref 0.7–4.0)
MCH: 29.5 pg (ref 26.0–34.0)
MCHC: 32.1 g/dL (ref 30.0–36.0)
MCV: 91.8 fL (ref 80.0–100.0)
Monocytes Absolute: 3.1 10*3/uL — ABNORMAL HIGH (ref 0.1–1.0)
Monocytes Relative: 7 %
Neutro Abs: 5.9 10*3/uL (ref 1.7–7.7)
Neutrophils Relative %: 13 %
Platelets: 189 10*3/uL (ref 150–400)
RBC: 4.51 MIL/uL (ref 3.87–5.11)
RDW: 14.4 % (ref 11.5–15.5)
WBC: 47.2 10*3/uL — ABNORMAL HIGH (ref 4.0–10.5)
nRBC: 0 % (ref 0.0–0.2)

## 2022-11-30 LAB — PATHOLOGIST SMEAR REVIEW

## 2022-11-30 MED ORDER — FENTANYL CITRATE (PF) 100 MCG/2ML IJ SOLN
INTRAMUSCULAR | Status: AC
  Filled 2022-11-30: qty 2

## 2022-11-30 MED ORDER — SODIUM CHLORIDE 0.9 % IV SOLN: INTRAVENOUS | Status: DC

## 2022-11-30 MED ORDER — MIDAZOLAM HCL 2 MG/2ML IJ SOLN
INTRAMUSCULAR | Status: AC
  Filled 2022-11-30: qty 2

## 2022-11-30 MED ORDER — HEPARIN SOD (PORK) LOCK FLUSH 100 UNIT/ML IV SOLN
INTRAVENOUS | Status: AC
Start: 2022-11-30 — End: ?
  Filled 2022-11-30: qty 5

## 2022-11-30 MED ORDER — LIDOCAINE 1 % OPTIME INJ - NO CHARGE
7.0000 mL | Freq: Once | INTRAMUSCULAR | Status: AC
  Administered 2022-11-30: 7 mL via INTRADERMAL
  Filled 2022-11-30: qty 8

## 2022-11-30 MED ORDER — FENTANYL CITRATE (PF) 100 MCG/2ML IJ SOLN
INTRAMUSCULAR | Status: AC | PRN
  Administered 2022-11-30: 50 ug via INTRAVENOUS
  Administered 2022-11-30 (×2): 25 ug via INTRAVENOUS

## 2022-11-30 MED ORDER — MIDAZOLAM HCL 2 MG/2ML IJ SOLN
INTRAMUSCULAR | Status: AC | PRN
Start: 2022-11-30 — End: 2022-11-30
  Administered 2022-11-30: 1 mg via INTRAVENOUS
  Administered 2022-11-30: .5 mg via INTRAVENOUS

## 2022-11-30 NOTE — Progress Notes (Signed)
Patient clinically stable post IR BMB per Dr Archer Asa, tolerated well. Awake/alert and oriented post procedure. Vitals stable post procedure. Received Versed 1.5 mg along with Fentanyl 100 mcg IV for procedure. Niece to bedside with update given. Report given to Moreen Fowler RN post procedure/specials/16.

## 2022-11-30 NOTE — CV Procedure (Signed)
Interventional Radiology Procedure Note  Procedure: CT guided aspirate and core biopsy of left iliac bone Complications: None Recommendations: - Bedrest supine x 1 hrs - Hydrocodone PRN  Pain - Follow biopsy results  Signed,  Lavida Patch K. Talayeh Bruinsma, MD   

## 2022-12-02 LAB — SURGICAL PATHOLOGY

## 2022-12-07 ENCOUNTER — Encounter (HOSPITAL_COMMUNITY): Payer: Self-pay

## 2022-12-08 ENCOUNTER — Inpatient Hospital Stay: Payer: Medicare HMO | Attending: Oncology | Admitting: Oncology

## 2022-12-08 ENCOUNTER — Encounter: Payer: Self-pay | Admitting: Oncology

## 2022-12-08 VITALS — BP 124/60 | HR 55 | Temp 95.5°F | Resp 17 | Ht 66.0 in | Wt 224.7 lb

## 2022-12-08 DIAGNOSIS — Z803 Family history of malignant neoplasm of breast: Secondary | ICD-10-CM | POA: Insufficient documentation

## 2022-12-08 DIAGNOSIS — Z800 Family history of malignant neoplasm of digestive organs: Secondary | ICD-10-CM | POA: Insufficient documentation

## 2022-12-08 DIAGNOSIS — C8307 Small cell B-cell lymphoma, spleen: Secondary | ICD-10-CM | POA: Insufficient documentation

## 2022-12-20 NOTE — Progress Notes (Signed)
Hematology/Oncology Consult note [facility]  [phone] [phone]  Patient Care Team: Lauro Regulus, MD as PCP - General (Internal Medicine) Delma Freeze, FNP as Nurse Practitioner (Family Medicine) Marcina Millard, MD as Consulting Physician (Cardiology) Creig Hines, MD as Consulting Physician (Oncology)   Name of the patient: Lori Browning  [id number]  27-Sep-1930   Date of visit: 12/20/22  Diagnosis- splenic marginal zone lymphoma  Chief complaint/ Reason for visit- discuss bone marrow biopsy results and further management  Heme/Onc history: patient is a 89 year old female with a past medical history significant for hypertension and hyperlipidemia.She has been referred to [place] for lymphocytosis.  Most recently her CBC showed white count of 26.4, H&H of 14.8/46.1 and platelet count of 159.  Differential mainly showed lymphocytosis with some basophilia and monocytosis.  Patient has had chronic leukocytosis/lymphocytosis at least dating back to 2017.  Hemoglobin and platelets have always been normal.  Differential in the past also has shown mainly lymphocytosis and some monocytosis.  Patient presently complains of pain in her right paraspinal area.  Reports that the pain worsens when she moves around or bends.  CT abdomen and pelvis with contrast in march 2022 showed liver cysts and mild splenomegaly of 14 cm.  No intra-abdominal adenopathy no other acute intra-abdominal pathology. spleen size in sept 2023 was 16.3 cm with volume of 1003 cc   Results of blood work from 05/06/2020 were as follows: CBC showed white cell count of 29.3, H&H of 14.5/45.4 with a platelet count of 162.  Differential mainly showed lymphocytosis with an absolute lymphocyte count of 20 with some monocytosis and basophilia.  BCR ABL FISH testing was negative.  Smear review showed monomorphic lymphoid cells which were large with abundant cytoplasm.  Morphology  indicative of a lymphoid neoplasm.  Differentiations include CLL with variant morphology, leukemic phase of lymphoma such as mantle cell lymphoma or prolymphocytic leukemia.  Flow cytometry showed CD5 negative CD10 negative and CD 103 - B-cell lymphoproliferative disorder. Typical phenotype for CLL, mantle cell, hairy cell or follicular center cell lymphoma not detected.    Given the persistent increase in white cell count and lymphocytosis patient had bone marrow biopsy on 11/30/2022.  Biopsy showed bone marrow and peripheral blood involved with B-cell lymphoproliferative disorder lacking CD5, CD10 and CD25 CD34 7038 CD103 or cyclin D1 expression.  Morphologic and immunophenotypic features include marginal zone lymphoma, splenic lymphoma.  Interval history- she is doing well presently. Denies any changes in appetite or weight. Denies abdominal pain or early satiety  ECOG PS- 1 Pain scale- 0   Review of systems- Review of Systems  Constitutional:  Negative for chills, fever, malaise/fatigue and weight loss.  HENT:  Negative for congestion, ear discharge and nosebleeds.   Eyes:  Negative for blurred vision.  Respiratory:  Negative for cough, hemoptysis, sputum production, shortness of breath and wheezing.   Cardiovascular:  Negative for chest pain, palpitations, orthopnea and claudication.  Gastrointestinal:  Negative for abdominal pain, blood in stool, constipation, diarrhea, heartburn, melena, nausea and vomiting.  Genitourinary:  Negative for dysuria, flank pain, frequency, hematuria and urgency.  Musculoskeletal:  Negative for back pain, joint pain and myalgias.  Skin:  Negative for rash.  Neurological:  Negative for dizziness, tingling, focal weakness, seizures, weakness and headaches.  Endo/Heme/Allergies:  Does not bruise/bleed easily.  Psychiatric/Behavioral:  Negative for depression and suicidal ideas. The patient does not have insomnia.       Allergies  Allergen Reactions  Sulfa  Antibiotics Nausea And Vomiting and Rash     Past Medical History:  Diagnosis Date   Atypical mole 07/07/2017   right upper back/severe. excision 07/07/2017. 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)    Urge incontinence      Past Surgical History:  Procedure Laterality Date   BREAST CYST EXCISION     CATARACT EXTRACTION     hip replacment     IR BONE MARROW BIOPSY & ASPIRATION  11/30/2022   REPLACEMENT TOTAL KNEE      Social History   Socioeconomic History   Marital status: Widowed    Spouse name: Not on file   Number of children: Not on file   Years of education: Not on file   Highest education level: Not on file  Occupational History   Not on file  Tobacco Use   Smoking status: Never   Smokeless tobacco: Never  Vaping Use   Vaping status: Never Used  Substance and Sexual Activity   Alcohol use: Never   Drug use: Never   Sexual activity: Not Currently  Other Topics Concern   Not on file  Social History Narrative   Not on file   Social Determinants of Health   Financial Resource Strain: Low Risk  (07/21/2022)   Received from [facility]   Overall Financial Resource Strain (CARDIA)    Difficulty of Paying Living Expenses: Not hard at all  Food Insecurity: No Food Insecurity (07/21/2022)   Received from [facility]   Hunger Vital Sign    Worried About Running Out of Food in the Last Year: Never true    Ran Out of Food in the Last Year: Never true  Transportation Needs: No Transportation Needs (07/21/2022)   Received from [facility] - Transportation    In the past 12 months, has lack of transportation kept you from medical appointments or from getting medications?: No    Lack of Transportation (Non-Medical): No  Physical Activity: Not on file  Stress:  Not on file  Social Connections: Not on file  Intimate Partner Violence: Not on file    Family History  Problem Relation Age of Onset   Hypertension Mother    Colon cancer Mother    Breast cancer Mother    Aortic aneurysm Father    Pancreatic cancer Brother    Colon cancer Brother    Heart disease Brother    Heart Problems Brother      Current Outpatient Medications:    amLODipine (NORVASC) 5 MG tablet, Take 5 mg by mouth daily., Disp: , Rfl:    carvedilol (COREG) 25 MG tablet, Take 25 mg by mouth 2 (two) times daily with a meal., Disp: , Rfl:    Cranberry 500 MG CAPS, Take 1 tablet by mouth daily. , Disp: , Rfl:    losartan-hydrochlorothiazide (HYZAAR) 100-12.5 MG tablet, Take 1 tablet by mouth daily., Disp: , Rfl:    pravastatin (PRAVACHOL) 40 MG tablet, Take 40 mg by mouth daily., Disp: , Rfl:    spironolactone (ALDACTONE) 25 MG tablet, Take 25 mg by mouth daily., Disp: , Rfl:   Physical exam:  Vitals:   12/08/22 1139  BP: 124/60  Pulse: (!) 55  Resp: 17  Temp: (!) 95.5 F (35.3 C)  TempSrc:  Tympanic  SpO2: 96%  Weight: 224 lb 11.2 oz (101.9 kg)  Height: 5\' 6"  (1.676 m)   Physical Exam Cardiovascular:     Rate and Rhythm: Normal rate and regular rhythm.     Heart sounds: Normal heart sounds.  Pulmonary:     Effort: Pulmonary effort is normal.     Breath sounds: Normal breath sounds.  Abdominal:     General: Bowel sounds are normal.     Palpations: Abdomen is soft.     Comments: Spleen tip palpable  Skin:    General: Skin is warm and dry.  Neurological:     Mental Status: She is alert and oriented to person, place, and time.         Latest Ref Rng & Units 03/30/2022   12:42 PM  CMP  Glucose 70 - 99 mg/dL 956   BUN 8 - 23 mg/dL 27   Creatinine 2.13 - 1.00 mg/dL 0.86   Sodium 578 - 469 mmol/L 136   Potassium 3.5 - 5.1 mmol/L 3.7   Chloride 98 - 111 mmol/L 102   CO2 22 - 32 mmol/L 25   Calcium 8.9 - 10.3 mg/dL 8.8   Total Protein 6.5 - 8.1 g/dL 6.8    Total Bilirubin 0.3 - 1.2 mg/dL 0.5   Alkaline Phos 38 - 126 U/L 39   AST 15 - 41 U/L 21   ALT 0 - 44 U/L 11       Latest Ref Rng & Units 11/30/2022    8:53 AM  CBC  WBC 4.0 - 10.5 K/uL 47.2   Hemoglobin 12.0 - 15.0 g/dL 62.9   Hematocrit 52.8 - 46.0 % 41.4   Platelets 150 - 400 K/uL 189     No images are attached to the encounter.  IR BONE MARROW BIOPSY & ASPIRATION  Result Date: 11/30/2022 INDICATION: 89 year old female with suspected lymphoproliferative disorder. She presents for bone marrow biopsy for staging. EXAM: CT GUIDED BONE MARROW ASPIRATION AND CORE BIOPSY Interventional Radiologist:  Sterling Big, MD MEDICATIONS: None. ANESTHESIA/SEDATION: Moderate (conscious) sedation was employed during this procedure. A total of 1.5 milligrams versed and 100 micrograms fentanyl were administered intravenously. The patient's level of consciousness and vital signs were monitored continuously by radiology nursing throughout the procedure under my direct supervision. Total monitored sedation time: 10 minutes FLUOROSCOPY: Radiation exposure index: 22 mGy reference air kerma COMPLICATIONS: None immediate. Estimated blood loss: <25 mL PROCEDURE: Informed written consent was obtained from the patient after a thorough discussion of the procedural risks, benefits and alternatives. All questions were addressed. Maximal Sterile Barrier Technique was utilized including caps, mask, sterile gowns, sterile gloves, sterile drape, hand hygiene and skin antiseptic. A timeout was performed prior to the initiation of the procedure. The patient was positioned prone and non-contrast localization CT was performed of the pelvis to demonstrate the iliac marrow spaces. Maximal barrier sterile technique utilized including caps, mask, sterile gowns, sterile gloves, large sterile drape, hand hygiene, and betadine prep. Under sterile conditions and local anesthesia, an 11 gauge coaxial bone biopsy needle was advanced  into the left iliac marrow space. Needle position was confirmed with CT imaging. Initially, bone marrow aspiration was performed. Next, the 11 gauge outer cannula was utilized to obtain a left iliac bone marrow core biopsy. Needle was removed. Hemostasis was obtained with compression. The patient tolerated the procedure well. Samples were prepared with the cytotechnologist. IMPRESSION: Technically successful left iliac bone marrow aspiration and core biopsy. Electronically Signed  By: Malachy Moan M.D.   On: 11/30/2022 11:07     Assessment and plan- Patient is a 89 y.o. female here to discuss bone marrow biopsy results and further management.    Patient has had a gradual increase in her white cell count over the last 2 years and presently white count is 47.  Hemoglobin and platelets are normal.  Differential mainly shows lymphocytosis and flow cytometry was consistent with low-grade B-cell lymphoproliferative disorder.  Ultrasound spleen also shows splenomegaly last year and I plan to repeat an ultrasound spleen in January of next year.  Discussed the results of bone marrow biopsy with the patient which shows 28% of bone marrow cells which are small and medium sized and involved with B-cell lymphoproliferative disorder.  All this clinically fits the picture of splenic marginal zone lymphoma which manifests as splenomegaly, bone marrow involvement and clonal B-cell lymphoproliferative process in the peripheral blood.  Patient continues to be asymptomatic at this time and does not have any B symptoms or other palpable adenopathy.  I will plan to get a repeat ultrasound in January of next year and if there is further increase in the size of spleen we will plan to treat her splenic marginal zone lymphoma with weekly Rituxan x 4. Visit Diagnosis 1. Splenic marginal zone b-cell lymphoma (HCC)      Dr. Owens Shark, MD, MPH [facility] [phone] 12/20/2022 11:35  AM

## 2023-02-11 NOTE — Telephone Encounter (Signed)
 Error

## 2023-02-17 ENCOUNTER — Encounter: Payer: Medicare HMO | Admitting: Dermatology

## 2023-02-23 ENCOUNTER — Ambulatory Visit
Admission: RE | Admit: 2023-02-23 | Discharge: 2023-02-23 | Disposition: A | Discharge status: Home / Self Care | Payer: Medicare HMO | Source: Ambulatory Visit | Attending: Oncology | Admitting: Oncology

## 2023-02-23 DIAGNOSIS — C8307 Small cell B-cell lymphoma, spleen: Secondary | ICD-10-CM | POA: Diagnosis present

## 2023-03-10 ENCOUNTER — Inpatient Hospital Stay: Payer: Medicare HMO | Attending: Oncology

## 2023-03-10 DIAGNOSIS — C8307 Small cell B-cell lymphoma, spleen: Secondary | ICD-10-CM | POA: Insufficient documentation

## 2023-03-10 LAB — CBC WITH DIFFERENTIAL/PLATELET
Abs Immature Granulocytes: 0 10*3/uL (ref 0.00–0.07)
Basophils Absolute: 0 10*3/uL (ref 0.0–0.1)
Basophils Relative: 0 %
Eosinophils Absolute: 0.3 10*3/uL (ref 0.0–0.5)
Eosinophils Relative: 1 %
HCT: 40.9 % (ref 36.0–46.0)
Hemoglobin: 13.2 g/dL (ref 12.0–15.0)
Lymphocytes Relative: 80 %
Lymphs Abs: 25.7 10*3/uL — ABNORMAL HIGH (ref 0.7–4.0)
MCH: 28.8 pg (ref 26.0–34.0)
MCHC: 32.3 g/dL (ref 30.0–36.0)
MCV: 89.3 fL (ref 80.0–100.0)
Monocytes Absolute: 3.9 10*3/uL — ABNORMAL HIGH (ref 0.1–1.0)
Monocytes Relative: 12 %
Neutro Abs: 2.2 10*3/uL (ref 1.7–7.7)
Neutrophils Relative %: 7 %
Platelets: 155 10*3/uL (ref 150–400)
RBC: 4.58 MIL/uL (ref 3.87–5.11)
RDW: 14.5 % (ref 11.5–15.5)
Smear Review: NORMAL
WBC Morphology: ABNORMAL
WBC: 32.1 10*3/uL — ABNORMAL HIGH (ref 4.0–10.5)
nRBC: 0 % (ref 0.0–0.2)

## 2023-03-11 LAB — KAPPA/LAMBDA LIGHT CHAINS
Kappa free light chain: 48 mg/L — ABNORMAL HIGH (ref 3.3–19.4)
Kappa, lambda light chain ratio: 1.69 — ABNORMAL HIGH (ref 0.26–1.65)
Lambda free light chains: 28.4 mg/L — ABNORMAL HIGH (ref 5.7–26.3)

## 2023-03-12 ENCOUNTER — Other Ambulatory Visit: Payer: Self-pay

## 2023-03-12 DIAGNOSIS — C8307 Small cell B-cell lymphoma, spleen: Secondary | ICD-10-CM

## 2023-03-15 LAB — MULTIPLE MYELOMA PANEL, SERUM
Albumin SerPl Elph-Mcnc: 3.7 g/dL (ref 2.9–4.4)
Albumin/Glob SerPl: 1.2 (ref 0.7–1.7)
Alpha 1: 0.3 g/dL (ref 0.0–0.4)
Alpha2 Glob SerPl Elph-Mcnc: 0.8 g/dL (ref 0.4–1.0)
B-Globulin SerPl Elph-Mcnc: 0.9 g/dL (ref 0.7–1.3)
Gamma Glob SerPl Elph-Mcnc: 1.2 g/dL (ref 0.4–1.8)
Globulin, Total: 3.2 g/dL (ref 2.2–3.9)
IgA: 95 mg/dL (ref 64–422)
IgG (Immunoglobin G), Serum: 1285 mg/dL (ref 586–1602)
IgM (Immunoglobulin M), Srm: 26 mg/dL (ref 26–217)
Total Protein ELP: 6.9 g/dL (ref 6.0–8.5)

## 2023-03-15 LAB — FISH HES LEUKEMIA, 4Q12 REA

## 2023-03-17 LAB — IFE+PROTEIN ELECTRO, 24-HR UR
% BETA, Urine: 27.6 %
ALPHA 1 URINE: 4.5 %
Albumin, U: 35.9 %
Alpha 2, Urine: 8.6 %
GAMMA GLOBULIN URINE: 23.5 %
Total Protein, Urine-Ur/day: 52 mg/(24.h) (ref 30–150)
Total Protein, Urine: 8.6 mg/dL
Total Volume: 600

## 2023-03-26 ENCOUNTER — Inpatient Hospital Stay: Payer: Medicare HMO | Attending: Oncology | Admitting: Oncology

## 2023-03-26 ENCOUNTER — Encounter: Payer: Self-pay | Admitting: Oncology

## 2023-03-26 VITALS — BP 140/55 | HR 64 | Temp 97.6°F | Resp 17 | Wt 224.0 lb

## 2023-03-26 DIAGNOSIS — Z803 Family history of malignant neoplasm of breast: Secondary | ICD-10-CM | POA: Insufficient documentation

## 2023-03-26 DIAGNOSIS — C8307 Small cell B-cell lymphoma, spleen: Secondary | ICD-10-CM | POA: Diagnosis not present

## 2023-03-26 DIAGNOSIS — Z800 Family history of malignant neoplasm of digestive organs: Secondary | ICD-10-CM | POA: Insufficient documentation

## 2023-03-26 DIAGNOSIS — D47Z9 Other specified neoplasms of uncertain behavior of lymphoid, hematopoietic and related tissue: Secondary | ICD-10-CM

## 2023-03-26 NOTE — Progress Notes (Signed)
Patient here for oncology follow-up appointment, concerns of dizziness and and SOB

## 2023-03-27 NOTE — Progress Notes (Signed)
Hematology/Oncology Consult note [facility]  Telephone:([phone] [phone]  Patient Care Team: Lauro Regulus, MD as PCP - General (Internal Medicine) Delma Freeze, FNP as Nurse Practitioner (Family Medicine) Marcina Millard, MD as Consulting Physician (Cardiology) Creig Hines, MD as Consulting Physician (Oncology)   Name of the patient: Lori Browning  [id number]  10-30-1930   Date of visit: 03/27/23  Diagnosis- splenic marginal zone lymphoma  Chief complaint/ Reason for visit- routine f/u of splenic marginal zone lymphoma  Heme/Onc history: patient is a 89 year old female with a past medical history significant for hypertension and hyperlipidemia.She has been referred to [place] for lymphocytosis.  Most recently her CBC showed white count of 26.4, H&H of 14.8/46.1 and platelet count of 159.  Differential mainly showed lymphocytosis with some basophilia and monocytosis.  Patient has had chronic leukocytosis/lymphocytosis at least dating back to 2017.  Hemoglobin and platelets have always been normal.  Differential in the past also has shown mainly lymphocytosis and some monocytosis.  Patient presently complains of pain in her right paraspinal area.  Reports that the pain worsens when she moves around or bends.  CT abdomen and pelvis with contrast in march 2022 showed liver cysts and mild splenomegaly of 14 cm.  No intra-abdominal adenopathy no other acute intra-abdominal pathology. spleen size in sept 2023 was 16.3 cm with volume of 1003 cc   Results of blood work from 05/06/2020 were as follows: CBC showed white cell count of 29.3, H&H of 14.5/45.4 with a platelet count of 162.  Differential mainly showed lymphocytosis with an absolute lymphocyte count of 20 with some monocytosis and basophilia.  BCR ABL FISH testing was negative.  Smear review showed monomorphic lymphoid cells which were large with abundant cytoplasm.  Morphology indicative  of a lymphoid neoplasm.  Differentiations include CLL with variant morphology, leukemic phase of lymphoma such as mantle cell lymphoma or prolymphocytic leukemia.  Flow cytometry showed CD5 negative CD10 negative and CD 103 - B-cell lymphoproliferative disorder. Typical phenotype for CLL, mantle cell, hairy cell or follicular center cell lymphoma not detected.     Given the persistent increase in white cell count and lymphocytosis patient had bone marrow biopsy on 11/30/2022.  Biopsy showed bone marrow and peripheral blood involved with B-cell lymphoproliferative disorder lacking CD5, CD10 and CD25 CD34 7038 CD103 or cyclin D1 expression.  Morphologic and immunophenotypic features include marginal zone lymphoma, splenic lymphoma.  Interval history-she is doing well overall.  Denies any abdominal pain or early satiety.  Appetite and weight have remained stable.  States that she had 1 episode of dizziness when she was sitting up from lying down position but it was self-limited.  She has not had any syncopal episodes.  ECOG PS- 1 Pain scale- 0   Review of systems- Review of Systems  Constitutional:  Negative for chills, fever, malaise/fatigue and weight loss.  HENT:  Negative for congestion, ear discharge and nosebleeds.   Eyes:  Negative for blurred vision.  Respiratory:  Negative for cough, hemoptysis, sputum production, shortness of breath and wheezing.   Cardiovascular:  Negative for chest pain, palpitations, orthopnea and claudication.  Gastrointestinal:  Negative for abdominal pain, blood in stool, constipation, diarrhea, heartburn, melena, nausea and vomiting.  Genitourinary:  Negative for dysuria, flank pain, frequency, hematuria and urgency.  Musculoskeletal:  Negative for back pain, joint pain and myalgias.  Skin:  Negative for rash.  Neurological:  Negative for dizziness, tingling, focal weakness, seizures, weakness and headaches.  Endo/Heme/Allergies:  Does not bruise/bleed easily.   Psychiatric/Behavioral:  Negative for depression and suicidal ideas. The patient does not have insomnia.       Allergies  Allergen Reactions   Sulfa Antibiotics Nausea And Vomiting and Rash     Past Medical History:  Diagnosis Date   Atypical mole 07/07/2017   right upper back/severe. excision 07/07/2017. 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)    Urge incontinence      Past Surgical History:  Procedure Laterality Date   BREAST CYST EXCISION     CATARACT EXTRACTION     hip replacment     IR BONE MARROW BIOPSY & ASPIRATION  11/30/2022   REPLACEMENT TOTAL KNEE      Social History   Socioeconomic History   Marital status: Widowed    Spouse name: Not on file   Number of children: Not on file   Years of education: Not on file   Highest education level: Not on file  Occupational History   Not on file  Tobacco Use   Smoking status: Never   Smokeless tobacco: Never  Vaping Use   Vaping status: Never Used  Substance and Sexual Activity   Alcohol use: Never   Drug use: Never   Sexual activity: Not Currently  Other Topics Concern   Not on file  Social History Narrative   Not on file   Social Drivers of Health   Financial Resource Strain: Low Risk  (07/21/2022)   Received from [facility]   Overall Financial Resource Strain (CARDIA)    Difficulty of Paying Living Expenses: Not hard at all  Food Insecurity: No Food Insecurity (07/21/2022)   Received from [facility]   Hunger Vital Sign    Worried About Running Out of Food in the Last Year: Never true    Ran Out of Food in the Last Year: Never true  Transportation Needs: No Transportation Needs (07/21/2022)   Received from [facility] - Transportation    In the past 12 months, has lack of transportation kept  you from medical appointments or from getting medications?: No    Lack of Transportation (Non-Medical): No  Physical Activity: Not on file  Stress: Not on file  Social Connections: Not on file  Intimate Partner Violence: Not on file    Family History  Problem Relation Age of Onset   Hypertension Mother    Colon cancer Mother    Breast cancer Mother    Aortic aneurysm Father    Pancreatic cancer Brother    Colon cancer Brother    Heart disease Brother    Heart Problems Brother      Current Outpatient Medications:    amLODipine (NORVASC) 5 MG tablet, Take 5 mg by mouth daily., Disp: , Rfl:    carvedilol (COREG) 25 MG tablet, Take 25 mg by mouth 2 (two) times daily with a meal., Disp: , Rfl:    Cranberry 500 MG CAPS, Take 1 tablet by mouth daily. , Disp: , Rfl:    losartan-hydrochlorothiazide (HYZAAR) 100-12.5 MG tablet, Take 1 tablet by mouth daily., Disp: , Rfl:    pravastatin (PRAVACHOL) 40 MG tablet, Take 40 mg by mouth daily., Disp: , Rfl:    spironolactone (ALDACTONE) 25 MG tablet, Take 25 mg by mouth  daily., Disp: , Rfl:   Physical exam:  Vitals:   03/26/23 1429  BP: (!) 140/55  Pulse: 64  Resp: 17  Temp: 97.6 F (36.4 C)  TempSrc: Tympanic  SpO2: 96%  Weight: 224 lb (101.6 kg)   Physical Exam Cardiovascular:     Rate and Rhythm: Normal rate and regular rhythm.     Heart sounds: Normal heart sounds.  Pulmonary:     Effort: Pulmonary effort is normal.     Breath sounds: Normal breath sounds.  Abdominal:     General: Bowel sounds are normal.     Palpations: Abdomen is soft.     Comments: No palpable splenomegaly  Skin:    General: Skin is warm and dry.  Neurological:     Mental Status: She is alert and oriented to person, place, and time.         Latest Ref Rng & Units 03/30/2022   12:42 PM  CMP  Glucose 70 - 99 mg/dL 093   BUN 8 - 23 mg/dL 27   Creatinine 2.35 - 1.00 mg/dL 5.73   Sodium 220 - 254 mmol/L 136   Potassium 3.5 - 5.1 mmol/L 3.7    Chloride 98 - 111 mmol/L 102   CO2 22 - 32 mmol/L 25   Calcium 8.9 - 10.3 mg/dL 8.8   Total Protein 6.5 - 8.1 g/dL 6.8   Total Bilirubin 0.3 - 1.2 mg/dL 0.5   Alkaline Phos 38 - 126 U/L 39   AST 15 - 41 U/L 21   ALT 0 - 44 U/L 11       Latest Ref Rng & Units 03/10/2023   11:24 AM  CBC  WBC 4.0 - 10.5 K/uL 32.1   Hemoglobin 12.0 - 15.0 g/dL 27.0   Hematocrit 62.3 - 46.0 % 40.9   Platelets 150 - 400 K/uL 155     Assessment and plan- Patient is a 89 y.o. female here for routine f/u of splenic marginal zone lymphoma  Patient recently had a ultrasound abdomen in January 2025 which showed splenic size of 17 cm and a volume of 103 1 cc.  This has been more or less stable as compared to her prior ultrasound from September 2023 when the volume was 1003 cc.  Splenomegaly in conjunction with bone marrow biopsy findings of low-grade B-cell lymphoproliferative disorder as well as peripheral flow cytometry which shows monoclonal B-cell lymphocytes is all consistent with splenic marginal zone lymphoma.  Given that her spleen size has remained stable over the last 1-1/2-year and patient reports no abdominal pain or early satiety I am still holding off on offering her weekly Rituxan treatment at this time.  If there is progressive increase in her splenomegaly which I will monitor with ultrasounds every 6 months I will consider offering Rituxan at that time.  I will see her back in 3 months with labs   Visit Diagnosis 1. Splenic marginal zone b-cell lymphoma (HCC)   2. 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 Owens Shark, MD, MPH [facility] at [facility] [phone] 03/27/2023 12:29 PM

## 2023-06-08 ENCOUNTER — Other Ambulatory Visit: Payer: Medicare HMO

## 2023-06-08 ENCOUNTER — Ambulatory Visit: Payer: Medicare HMO | Admitting: Oncology

## 2023-06-23 ENCOUNTER — Inpatient Hospital Stay: Payer: Medicare HMO | Attending: Oncology

## 2023-06-23 ENCOUNTER — Encounter: Payer: Self-pay | Admitting: Oncology

## 2023-06-23 ENCOUNTER — Inpatient Hospital Stay (HOSPITAL_BASED_OUTPATIENT_CLINIC_OR_DEPARTMENT_OTHER): Payer: Medicare HMO | Admitting: Oncology

## 2023-06-23 VITALS — BP 136/52 | HR 61 | Temp 97.6°F | Resp 19 | Ht 66.0 in | Wt 224.3 lb

## 2023-06-23 DIAGNOSIS — C8307 Small cell B-cell lymphoma, spleen: Secondary | ICD-10-CM | POA: Diagnosis present

## 2023-06-23 DIAGNOSIS — R161 Splenomegaly, not elsewhere classified: Secondary | ICD-10-CM | POA: Diagnosis not present

## 2023-06-23 DIAGNOSIS — Z803 Family history of malignant neoplasm of breast: Secondary | ICD-10-CM | POA: Diagnosis not present

## 2023-06-23 DIAGNOSIS — Z800 Family history of malignant neoplasm of digestive organs: Secondary | ICD-10-CM | POA: Insufficient documentation

## 2023-06-23 DIAGNOSIS — D47Z9 Other specified neoplasms of uncertain behavior of lymphoid, hematopoietic and related tissue: Secondary | ICD-10-CM | POA: Insufficient documentation

## 2023-06-23 LAB — CMP (CANCER CENTER ONLY)
ALT: 13 U/L (ref 0–44)
AST: 18 U/L (ref 15–41)
Albumin: 3.9 g/dL (ref 3.5–5.0)
Alkaline Phosphatase: 45 U/L (ref 38–126)
Anion gap: 9 (ref 5–15)
BUN: 22 mg/dL (ref 8–23)
CO2: 24 mmol/L (ref 22–32)
Calcium: 8.9 mg/dL (ref 8.9–10.3)
Chloride: 102 mmol/L (ref 98–111)
Creatinine: 1.05 mg/dL — ABNORMAL HIGH (ref 0.44–1.00)
GFR, Estimated: 50 mL/min — ABNORMAL LOW (ref >60)
Glucose, Bld: 101 mg/dL — ABNORMAL HIGH (ref 70–99)
Potassium: 4.1 mmol/L (ref 3.5–5.1)
Sodium: 135 mmol/L (ref 135–145)
Total Bilirubin: 0.8 mg/dL (ref 0.0–1.2)
Total Protein: 7.2 g/dL (ref 6.5–8.1)

## 2023-06-23 LAB — CBC WITH DIFFERENTIAL (CANCER CENTER ONLY)
Abs Immature Granulocytes: 0.14 10*3/uL — ABNORMAL HIGH (ref 0.00–0.07)
Basophils Absolute: 0.1 10*3/uL (ref 0.0–0.1)
Basophils Relative: 0 %
Eosinophils Absolute: 0.2 10*3/uL (ref 0.0–0.5)
Eosinophils Relative: 1 %
HCT: 40.4 % (ref 36.0–46.0)
Hemoglobin: 13.2 g/dL (ref 12.0–15.0)
Immature Granulocytes: 0 %
Lymphocytes Relative: 74 %
Lymphs Abs: 27.6 10*3/uL — ABNORMAL HIGH (ref 0.7–4.0)
MCH: 29.1 pg (ref 26.0–34.0)
MCHC: 32.7 g/dL (ref 30.0–36.0)
MCV: 89 fL (ref 80.0–100.0)
Monocytes Absolute: 4.8 10*3/uL — ABNORMAL HIGH (ref 0.1–1.0)
Monocytes Relative: 13 %
Neutro Abs: 4.4 10*3/uL (ref 1.7–7.7)
Neutrophils Relative %: 12 %
Platelet Count: 139 10*3/uL — ABNORMAL LOW (ref 150–400)
RBC: 4.54 MIL/uL (ref 3.87–5.11)
RDW: 14.4 % (ref 11.5–15.5)
Smear Review: NORMAL
WBC Count: 37.1 10*3/uL — ABNORMAL HIGH (ref 4.0–10.5)
WBC Morphology: ABNORMAL
nRBC: 0 % (ref 0.0–0.2)

## 2023-06-23 LAB — LACTATE DEHYDROGENASE: LDH: 103 U/L (ref 98–192)

## 2023-06-24 NOTE — Progress Notes (Signed)
 Hematology/Oncology Consult note [facility]  Telephone:([phone] [phone]  Patient Care Team: Jimmy Moulding, MD as PCP - General (Internal Medicine) Charlette Console, FNP as Nurse Practitioner (Family Medicine) Percival Brace, MD as Consulting Physician (Cardiology) Avonne Boettcher, MD as Consulting Physician (Oncology)   Name of the patient: Lori Browning  [id number]  August 09, 1930   Date of visit: 06/24/23  Diagnosis- splenic marginal zone lymphoma  Chief complaint/ Reason for visit- routine f/u of splenic marginal zone lymphoma  Heme/Onc history: patient is a 89 year old female with a past medical history significant for hypertension and hyperlipidemia.She has been referred to us  for lymphocytosis.  Most recently her CBC showed white count of 26.4, H&H of 14.8/46.1 and platelet count of 159.  Differential mainly showed lymphocytosis with some basophilia and monocytosis.  Patient has had chronic leukocytosis/lymphocytosis at least dating back to 2017.  Hemoglobin and platelets have always been normal.  Differential in the past also has shown mainly lymphocytosis and some monocytosis.  Patient presently complains of pain in her right paraspinal area.  Reports that the pain worsens when she moves around or bends.  CT abdomen and pelvis with contrast in march 2022 showed liver cysts and mild splenomegaly of 14 cm.  No intra-abdominal adenopathy no other acute intra-abdominal pathology. spleen size in sept 2023 was 16.3 cm with volume of 1003 cc   Results of blood work from 05/06/2020 were as follows: CBC showed white cell count of 29.3, H&H of 14.5/45.4 with a platelet count of 162.  Differential mainly showed lymphocytosis with an absolute lymphocyte count of 20 with some monocytosis and basophilia.  BCR ABL FISH testing was negative.  Smear review showed monomorphic lymphoid cells which were large with abundant cytoplasm.  Morphology indicative  of a lymphoid neoplasm.  Differentiations include CLL with variant morphology, leukemic phase of lymphoma such as mantle cell lymphoma or prolymphocytic leukemia.  Flow cytometry showed CD5 negative CD10 negative and CD 103 - B-cell lymphoproliferative disorder. Typical phenotype for CLL, mantle cell, hairy cell or follicular center cell lymphoma not detected.     Given the persistent increase in white cell count and lymphocytosis patient had bone marrow biopsy on 11/30/2022.  Biopsy showed bone marrow and peripheral blood involved with B-cell lymphoproliferative disorder lacking CD5, CD10 and CD25 CD34 7038 CD103 or cyclin D1 expression.  Morphologic and immunophenotypic features include marginal zone lymphoma, splenic lymphoma.  Interval history-patient is doing well for her age.  She denies any changes in her appetite or weight.  Denies any early satiety or abdominal pain.  ECOG PS- 1 Pain scale- 0 Opioid associated constipation- no  Review of systems- Review of Systems  Constitutional:  Negative for chills, fever, malaise/fatigue and weight loss.  HENT:  Negative for congestion, ear discharge and nosebleeds.   Eyes:  Negative for blurred vision.  Respiratory:  Negative for cough, hemoptysis, sputum production, shortness of breath and wheezing.   Cardiovascular:  Negative for chest pain, palpitations, orthopnea and claudication.  Gastrointestinal:  Negative for abdominal pain, blood in stool, constipation, diarrhea, heartburn, melena, nausea and vomiting.  Genitourinary:  Negative for dysuria, flank pain, frequency, hematuria and urgency.  Musculoskeletal:  Negative for back pain, joint pain and myalgias.  Skin:  Negative for rash.  Neurological:  Negative for dizziness, tingling, focal weakness, seizures, weakness and headaches.  Endo/Heme/Allergies:  Does not bruise/bleed easily.  Psychiatric/Behavioral:  Negative for depression and suicidal ideas. The patient does not have insomnia.  Allergies  Allergen Reactions   Sulfa Antibiotics Nausea And Vomiting and Rash     Past Medical History:  Diagnosis Date   Atypical mole 07/07/2017   right upper back/severe. excision 07/07/2017. 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)    Urge incontinence      Past Surgical History:  Procedure Laterality Date   BREAST CYST EXCISION     CATARACT EXTRACTION     hip replacment     IR BONE MARROW BIOPSY & ASPIRATION  11/30/2022   REPLACEMENT TOTAL KNEE      Social History   Socioeconomic History   Marital status: Widowed    Spouse name: Not on file   Number of children: Not on file   Years of education: Not on file   Highest education level: Not on file  Occupational History   Not on file  Tobacco Use   Smoking status: Never   Smokeless tobacco: Never  Vaping Use   Vaping status: Never Used  Substance and Sexual Activity   Alcohol use: Never   Drug use: Never   Sexual activity: Not Currently  Other Topics Concern   Not on file  Social History Narrative   Not on file   Social Drivers of Health   Financial Resource Strain: Low Risk  (07/21/2022)   Received from [facility]   Overall Financial Resource Strain (CARDIA)    Difficulty of Paying Living Expenses: Not hard at all  Food Insecurity: No Food Insecurity (07/21/2022)   Received from [facility]   Hunger Vital Sign    Worried About Running Out of Food in the Last Year: Never true    Ran Out of Food in the Last Year: Never true  Transportation Needs: No Transportation Needs (07/21/2022)   Received from [facility] - Transportation    In the past 12 months, has lack of transportation kept you from medical appointments or from getting medications?: No    Lack of Transportation (Non-Medical): No   Physical Activity: Not on file  Stress: Not on file  Social Connections: Not on file  Intimate Partner Violence: Not on file    Family History  Problem Relation Age of Onset   Hypertension Mother    Colon cancer Mother    Breast cancer Mother    Aortic aneurysm Father    Pancreatic cancer Brother    Colon cancer Brother    Heart disease Brother    Heart Problems Brother      Current Outpatient Medications:    amLODipine (NORVASC) 5 MG tablet, Take 5 mg by mouth daily., Disp: , Rfl:    carvedilol  (COREG ) 25 MG tablet, Take 25 mg by mouth 2 (two) times daily with a meal., Disp: , Rfl:    Cranberry 500 MG CAPS, Take 1 tablet by mouth daily. , Disp: , Rfl:    losartan-hydrochlorothiazide (HYZAAR) 100-12.5 MG tablet, Take 1 tablet by mouth daily., Disp: , Rfl:    pravastatin  (PRAVACHOL ) 40 MG tablet, Take 40 mg by mouth daily., Disp: , Rfl:    spironolactone  (ALDACTONE ) 25 MG tablet, Take 25 mg by mouth daily., Disp: , Rfl:   Physical exam:  Vitals:   06/23/23 1456  BP: (!) 136/52  Pulse: 61  Resp: 19  Temp: 97.6 F (  36.4 C)  TempSrc: Tympanic  SpO2: 94%  Weight: 224 lb 4.8 oz (101.7 kg)  Height: 5\' 6"  (1.676 m)   Physical Exam Cardiovascular:     Rate and Rhythm: Normal rate and regular rhythm.     Heart sounds: Normal heart sounds.  Pulmonary:     Effort: Pulmonary effort is normal.     Breath sounds: Normal breath sounds.  Abdominal:     General: Bowel sounds are normal.     Palpations: Abdomen is soft.     Comments: Splenic tip palpable. Unchanged from prior  Skin:    General: Skin is warm and dry.  Neurological:     Mental Status: She is alert and oriented to person, place, and time.      I have personally reviewed labs listed below:    Latest Ref Rng & Units 06/23/2023    2:30 PM  CMP  Glucose 70 - 99 mg/dL 562   BUN 8 - 23 mg/dL 22   Creatinine 1.30 - 1.00 mg/dL 8.65   Sodium 784 - 696 mmol/L 135   Potassium 3.5 - 5.1 mmol/L 4.1   Chloride 98 -  111 mmol/L 102   CO2 22 - 32 mmol/L 24   Calcium 8.9 - 10.3 mg/dL 8.9   Total Protein 6.5 - 8.1 g/dL 7.2   Total Bilirubin 0.0 - 1.2 mg/dL 0.8   Alkaline Phos 38 - 126 U/L 45   AST 15 - 41 U/L 18   ALT 0 - 44 U/L 13       Latest Ref Rng & Units 06/23/2023    2:30 PM  CBC  WBC 4.0 - 10.5 K/uL 37.1   Hemoglobin 12.0 - 15.0 g/dL 29.5   Hematocrit 28.4 - 46.0 % 40.4   Platelets 150 - 400 K/uL 139      Assessment and plan- Patient is a 89 y.o. female here for a follow-up of splenic marginal zone lymphoma  Patient has baseline leukocytosis/lymphocytosis which has remained stable over the last 1 year.  Flow cytometry has shown clonal B cells consistent with B-cell lymphoproliferative disorder.  PET CT scan has not shown any evidence of adenopathy elsewhere and given the spleen and bone marrow involvement and results of flow cytometry which shows CD5 negative CD10 negative CD103 negative B-cell lymphoproliferative disorder this was all consistent with splenic marginal zone lymphoma.  Her spleen size has remained stable around 1000 cc over the last 1 year.  Patient does not report any early satiety.  Clinically today her splenomegaly is no worse.  I will continue to observe her splenomegaly and get a repeat ultrasound every 6 months.  If there is progressive increase in the size of her spleen, worsening lymphocytosis or worsening thrombocytopenia-I will consider giving her 4 weekly cycles of Rituxan at that point.  I will see her back in 3 months with CBC with differential CMP LDH and ultrasound spleen prior   Visit Diagnosis 1. Splenic marginal zone b-cell lymphoma (HCC)   2. 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 Seretha Dance, MD, MPH [facility] at [facility] [phone] 06/24/2023 2:10 PM

## 2023-07-27 ENCOUNTER — Ambulatory Visit (INDEPENDENT_AMBULATORY_CARE_PROVIDER_SITE_OTHER): Payer: Medicare HMO | Admitting: Dermatology

## 2023-07-27 DIAGNOSIS — L578 Other skin changes due to chronic exposure to nonionizing radiation: Secondary | ICD-10-CM

## 2023-07-27 DIAGNOSIS — L821 Other seborrheic keratosis: Secondary | ICD-10-CM

## 2023-07-27 DIAGNOSIS — D239 Other benign neoplasm of skin, unspecified: Secondary | ICD-10-CM

## 2023-07-27 DIAGNOSIS — Z1283 Encounter for screening for malignant neoplasm of skin: Secondary | ICD-10-CM

## 2023-07-27 DIAGNOSIS — Z86018 Personal history of other benign neoplasm: Secondary | ICD-10-CM

## 2023-07-27 DIAGNOSIS — L738 Other specified follicular disorders: Secondary | ICD-10-CM

## 2023-07-27 DIAGNOSIS — D229 Melanocytic nevi, unspecified: Secondary | ICD-10-CM

## 2023-07-27 DIAGNOSIS — L853 Xerosis cutis: Secondary | ICD-10-CM

## 2023-07-27 DIAGNOSIS — I8393 Asymptomatic varicose veins of bilateral lower extremities: Secondary | ICD-10-CM

## 2023-07-27 DIAGNOSIS — I781 Nevus, non-neoplastic: Secondary | ICD-10-CM

## 2023-07-27 DIAGNOSIS — L720 Epidermal cyst: Secondary | ICD-10-CM

## 2023-07-27 DIAGNOSIS — L729 Follicular cyst of the skin and subcutaneous tissue, unspecified: Secondary | ICD-10-CM

## 2023-07-27 DIAGNOSIS — W908XXA Exposure to other nonionizing radiation, initial encounter: Secondary | ICD-10-CM | POA: Diagnosis not present

## 2023-07-27 DIAGNOSIS — L814 Other melanin hyperpigmentation: Secondary | ICD-10-CM | POA: Diagnosis not present

## 2023-07-27 DIAGNOSIS — D1801 Hemangioma of skin and subcutaneous tissue: Secondary | ICD-10-CM

## 2023-07-27 NOTE — Progress Notes (Signed)
 Follow-Up Visit   Subjective  Lori Browning is a 89 y.o. female who presents for the following: Skin Cancer Screening and Full Body Skin Exam  The patient presents for Total-Body Skin Exam (TBSE) for skin cancer screening and mole check. The patient has spots, moles and lesions to be evaluated, some Lori be new or changing and the patient Lori have concern these could be cancer.    The following portions of the chart were reviewed this encounter and updated as appropriate: medications, allergies, medical history  Review of Systems:  No other skin or systemic complaints except as noted in HPI or Assessment and Plan.  Objective  Well appearing patient in no apparent distress; mood and affect are within normal limits.  A full examination was performed including scalp, head, eyes, ears, nose, lips, neck, chest, axillae, abdomen, back, buttocks, bilateral upper extremities, bilateral lower extremities, hands, feet, fingers, toes, fingernails, and toenails. All findings within normal limits unless otherwise noted below.   Relevant physical exam findings are noted in the Assessment and Plan.    Assessment & Plan   SKIN CANCER SCREENING PERFORMED TODAY.  ACTINIC DAMAGE - Chronic condition, secondary to cumulative UV/sun exposure - diffuse scaly erythematous macules with underlying dyspigmentation - Recommend daily broad spectrum sunscreen SPF 30+ to sun-exposed areas, reapply every 2 hours as needed.  - Staying in the shade or wearing long sleeves, sun glasses (UVA+UVB protection) and wide brim hats (4-inch brim around the entire circumference of the hat) are also recommended for sun protection.  - Call for new or changing lesions.  LENTIGINES, SEBORRHEIC KERATOSES, HEMANGIOMAS - Benign normal skin lesions - Benign-appearing - Call for any changes  MELANOCYTIC NEVI - Tan-brown and/or pink-flesh-colored symmetric macules and papules - Benign appearing on exam today - Observation -  Call clinic for new or changing moles - Recommend daily use of broad spectrum spf 30+ sunscreen to sun-exposed areas.   HISTORY OF DYSPLASTIC NEVUS R upper back, severe, exc 07/07/2017 - No evidence of recurrence today - Recommend regular full body skin exams - Recommend daily broad spectrum sunscreen SPF 30+ to sun-exposed areas, reapply every 2 hours as needed.  - Call if any new or changing lesions are noted between office visits  EPIDERMAL INCLUSION CYST Exam: Subcutaneous nodule at spinal mid upper back  Benign-appearing. Exam most consistent with an epidermal inclusion cyst. Discussed that a cyst is a benign growth that can grow over time and sometimes get irritated or inflamed. Recommend observation if it is not bothersome. Discussed option of surgical excision to remove it if it is growing, symptomatic, or other changes noted. Please call for new or changing lesions so they can be evaluated.  Xerosis - diffuse xerotic patches - recommend gentle, hydrating skin care - gentle skin care handout given - Samples of AmLactin, Aveeno Eczema, and Dove body wash  DILATED PORE OF Festus Hubert Exam: Right nasal dorsum and spinal upper back with dilated pore.  Benign, observe.   Varicose Veins/Spider Veins - Dilated blue, purple or red veins at the lower extremities - Reassured - Smaller vessels can be treated by sclerotherapy (a procedure to inject a medicine into the veins to make them disappear) if desired, but the treatment is not covered by insurance. Larger vessels Lori be covered if symptomatic and we would refer to vascular surgeon if treatment desired.   Return in about 1 year (around 07/26/2024) for TBSE, Hx Dysplastic Nevus.  IBernardine Bridegroom, CMA, am acting as scribe for  Artemio Larry, MD .   Documentation: I have reviewed the above documentation for accuracy and completeness, and I agree with the above.  Artemio Larry, MD

## 2023-07-27 NOTE — Patient Instructions (Addendum)
 Gentle Skin Care Guide  1. Bathe no more than once a day.  2. Avoid bathing in hot water  3. Use a mild soap like Dove, Vanicream, Cetaphil, CeraVe. Can use Lever 2000 or Cetaphil antibacterial soap  4. Use soap only where you need it. On most days, use it under your arms, between your legs, and on your feet. Let the water rinse other areas unless visibly dirty.  5. When you get out of the bath/shower, use a towel to gently blot your skin dry, don't rub it.  6. While your skin is still a little damp, apply a moisturizing cream such as Vanicream, CeraVe, Cetaphil, Eucerin, Sarna lotion or plain Vaseline Jelly. For hands apply Neutrogena [place] Hand Cream or Excipial Hand Cream.  7. Reapply moisturizer any time you start to itch or feel dry.  8. Sometimes using free and clear laundry detergents can be helpful. Fabric softener sheets should be avoided. Downy Free & Gentle liquid, or any liquid fabric softener that is free of dyes and perfumes, it acceptable to use  9. If your doctor has given you prescription creams you may apply moisturizers over them      Due to recent changes in healthcare laws, you may see results of your pathology and/or laboratory studies on MyChart before the doctors have had a chance to review them. We understand that in some cases there may be results that are confusing or concerning to you. Please understand that not all results are received at the same time and often the doctors may need to interpret multiple results in order to provide you with the best plan of care or course of treatment. Therefore, we ask that you please give [place] 2 business days to thoroughly review all your results before contacting the office for clarification. Should we see a critical lab result, you will be contacted sooner.   If You Need Anything After Your Visit  If you have any questions or concerns for your doctor, please call our main line at [phone] and press option 4 to reach  your doctor's medical assistant. If no one answers, please leave a voicemail as directed and we will return your call as soon as possible. Messages left after 4 pm will be answered the following business day.   You may also send [place] a message via MyChart. We typically respond to MyChart messages within 1-2 business days.  For prescription refills, please ask your pharmacy to contact our office. Our fax number is [phone].  If you have an urgent issue when the clinic is closed that cannot wait until the next business day, you can page your doctor at the number below.    Please note that while we do our best to be available for urgent issues outside of office hours, we are not available 24/7.   If you have an urgent issue and are unable to reach [place], you may choose to seek medical care at your doctor's office, retail clinic, urgent care center, or emergency room.  If you have a medical emergency, please immediately call 911 or go to the emergency department.  Pager Numbers  - Dr. Gwen Pounds: [phone]  - Dr. Roseanne Reno: [phone]  - Dr. Katrinka Blazing: [phone]   In the event of inclement weather, please call our main line at [phone] for an update on the status of any delays or closures.  Dermatology Medication Tips: Please keep the boxes that topical medications come in in order to help keep track of the instructions  about where and how to use these. Pharmacies typically print the medication instructions only on the boxes and not directly on the medication tubes.   If your medication is too expensive, please contact our office at [phone] option 4 or send [place] a message through MyChart.   We are unable to tell what your co-pay for medications will be in advance as this is different depending on your insurance coverage. However, we may be able to find a substitute medication at lower cost or fill out paperwork to get insurance to cover a needed medication.   If a prior authorization  is required to get your medication covered by your insurance company, please allow [place] 1-2 business days to complete this process.  Drug prices often vary depending on where the prescription is filled and some pharmacies may offer cheaper prices.  The website www.goodrx.com contains coupons for medications through different pharmacies. The prices here do not account for what the cost may be with help from insurance (it may be cheaper with your insurance), but the website can give you the price if you did not use any insurance.  - You can print the associated coupon and take it with your prescription to the pharmacy.  - You may also stop by our office during regular business hours and pick up a GoodRx coupon card.  - If you need your prescription sent electronically to a different pharmacy, notify our office through [facility] or by phone at [phone] option 4.     Si Usted Necesita Algo Despus de Su Visita  Tambin puede enviarnos un mensaje a travs de Clinical cytogeneticist. Por lo general respondemos a los mensajes de MyChart en el transcurso de 1 a 2 das hbiles.  Para renovar recetas, por favor pida a su farmacia que se ponga en contacto con nuestra oficina. Annie Sable de fax es [place] [phone].  Si tiene un asunto urgente cuando la clnica est cerrada y que no puede esperar hasta el siguiente da hbil, puede llamar/localizar a su doctor(a) al nmero que aparece a continuacin.   Por favor, tenga en cuenta que aunque hacemos todo lo posible para estar disponibles para asuntos urgentes fuera del horario de [place], no estamos disponibles las 24 horas del da, los 7 [address]  [address] de la [place].   Si tiene un problema urgente y no puede comunicarse con nosotros, puede optar por buscar atencin mdica  en el consultorio de su doctor(a), en una clnica privada, en un centro de atencin urgente o en una sala de emergencias.  Si tiene Engineer, drilling, por favor llame inmediatamente al 911 o  vaya a la sala de emergencias.  Nmeros de bper  - Dr. Gwen Pounds: [phone]  - Dra. Roseanne Reno: [phone]  - Dr. Katrinka Blazing: [phone]   En caso de inclemencias del tiempo, por favor llame a Lacy Duverney principal al [phone] para una actualizacin sobre el [place] de cualquier retraso o cierre.  Consejos para la medicacin en dermatologa: Por favor, guarde las cajas en las que vienen los medicamentos de uso tpico para ayudarle a seguir las instrucciones sobre dnde y cmo usarlos. Las farmacias generalmente imprimen las instrucciones del medicamento slo en las cajas y no directamente en los tubos del [place].   Si su medicamento es muy caro, por favor, pngase en contacto con Rolm Gala llamando al [phone] y presione la opcin 4 o envenos un mensaje a travs de Clinical cytogeneticist.   No podemos decirle cul ser su copago por los medicamentos por adelantado ya  que esto es diferente dependiendo de la cobertura de su seguro. Sin embargo, es posible que podamos encontrar un medicamento sustituto a Audiological scientist un formulario para que el seguro cubra el medicamento que se considera necesario.   Si se requiere una autorizacin previa para que su compaa de seguros [place] su medicamento, por favor permtanos de 1 a 2 das hbiles para completar [address]  Los precios de los medicamentos varan con frecuencia dependiendo del Environmental consultant de dnde se surte la receta y alguna farmacias pueden ofrecer precios ms baratos.  El sitio web www.goodrx.com tiene cupones para medicamentos de Health and safety inspector. Los precios aqu no tienen en cuenta lo que podra costar con la ayuda del seguro (puede ser ms barato con su seguro), pero el sitio web puede darle el precio si no utiliz Tourist information centre manager.  - Puede imprimir el cupn correspondiente y llevarlo con su receta a la farmacia.  - Tambin puede pasar por nuestra oficina durante el horario de atencin regular y Education officer, museum una tarjeta de cupones  de GoodRx.  - Si necesita que su receta se enve electrnicamente a una farmacia diferente, informe a nuestra oficina a travs de MyChart de [place] o por telfono llamando al [phone] y presione la opcin 4.

## 2023-09-23 ENCOUNTER — Ambulatory Visit
Admission: RE | Admit: 2023-09-23 | Discharge: 2023-09-23 | Disposition: A | Discharge status: Home / Self Care | Source: Ambulatory Visit | Attending: Oncology | Admitting: Oncology

## 2023-09-23 DIAGNOSIS — C8307 Small cell B-cell lymphoma, spleen: Secondary | ICD-10-CM | POA: Insufficient documentation

## 2023-10-01 ENCOUNTER — Encounter: Payer: Self-pay | Admitting: Oncology

## 2023-10-01 ENCOUNTER — Inpatient Hospital Stay: Admitting: Oncology

## 2023-10-01 ENCOUNTER — Inpatient Hospital Stay: Attending: Oncology

## 2023-10-01 VITALS — BP 146/74 | HR 68 | Temp 96.6°F | Resp 16 | Wt 224.0 lb

## 2023-10-01 DIAGNOSIS — J449 Chronic obstructive pulmonary disease, unspecified: Secondary | ICD-10-CM | POA: Insufficient documentation

## 2023-10-01 DIAGNOSIS — Z8249 Family history of ischemic heart disease and other diseases of the circulatory system: Secondary | ICD-10-CM | POA: Diagnosis not present

## 2023-10-01 DIAGNOSIS — I509 Heart failure, unspecified: Secondary | ICD-10-CM | POA: Diagnosis not present

## 2023-10-01 DIAGNOSIS — Z79899 Other long term (current) drug therapy: Secondary | ICD-10-CM | POA: Insufficient documentation

## 2023-10-01 DIAGNOSIS — Z800 Family history of malignant neoplasm of digestive organs: Secondary | ICD-10-CM | POA: Insufficient documentation

## 2023-10-01 DIAGNOSIS — C8307 Small cell B-cell lymphoma, spleen: Secondary | ICD-10-CM | POA: Insufficient documentation

## 2023-10-01 DIAGNOSIS — Z882 Allergy status to sulfonamides status: Secondary | ICD-10-CM | POA: Insufficient documentation

## 2023-10-01 DIAGNOSIS — I110 Hypertensive heart disease with heart failure: Secondary | ICD-10-CM | POA: Diagnosis not present

## 2023-10-01 DIAGNOSIS — Z803 Family history of malignant neoplasm of breast: Secondary | ICD-10-CM | POA: Diagnosis not present

## 2023-10-01 DIAGNOSIS — D47Z9 Other specified neoplasms of uncertain behavior of lymphoid, hematopoietic and related tissue: Secondary | ICD-10-CM

## 2023-10-01 LAB — CBC WITH DIFFERENTIAL (CANCER CENTER ONLY)
Abs Immature Granulocytes: 0.14 K/uL — ABNORMAL HIGH (ref 0.00–0.07)
Basophils Absolute: 0.1 K/uL (ref 0.0–0.1)
Basophils Relative: 0 %
Eosinophils Absolute: 0.1 K/uL (ref 0.0–0.5)
Eosinophils Relative: 0 %
HCT: 38.9 % (ref 36.0–46.0)
Hemoglobin: 12.6 g/dL (ref 12.0–15.0)
Immature Granulocytes: 0 %
Lymphocytes Relative: 75 %
Lymphs Abs: 32.3 K/uL — ABNORMAL HIGH (ref 0.7–4.0)
MCH: 29.1 pg (ref 26.0–34.0)
MCHC: 32.4 g/dL (ref 30.0–36.0)
MCV: 89.8 fL (ref 80.0–100.0)
Monocytes Absolute: 5.7 K/uL — ABNORMAL HIGH (ref 0.1–1.0)
Monocytes Relative: 13 %
Neutro Abs: 5.1 K/uL (ref 1.7–7.7)
Neutrophils Relative %: 12 %
Platelet Count: 152 K/uL (ref 150–400)
RBC: 4.33 MIL/uL (ref 3.87–5.11)
RDW: 14.8 % (ref 11.5–15.5)
Smear Review: NORMAL
WBC Count: 43.5 K/uL — ABNORMAL HIGH (ref 4.0–10.5)
nRBC: 0 % (ref 0.0–0.2)

## 2023-10-01 LAB — CMP (CANCER CENTER ONLY)
ALT: 13 U/L (ref 0–44)
AST: 19 U/L (ref 15–41)
Albumin: 3.8 g/dL (ref 3.5–5.0)
Alkaline Phosphatase: 49 U/L (ref 38–126)
Anion gap: 8 (ref 5–15)
BUN: 32 mg/dL — ABNORMAL HIGH (ref 8–23)
CO2: 24 mmol/L (ref 22–32)
Calcium: 9.4 mg/dL (ref 8.9–10.3)
Chloride: 102 mmol/L (ref 98–111)
Creatinine: 1.01 mg/dL — ABNORMAL HIGH (ref 0.44–1.00)
GFR, Estimated: 53 mL/min — ABNORMAL LOW (ref >60)
Glucose, Bld: 98 mg/dL (ref 70–99)
Potassium: 3.9 mmol/L (ref 3.5–5.1)
Sodium: 134 mmol/L — ABNORMAL LOW (ref 135–145)
Total Bilirubin: 0.9 mg/dL (ref 0.0–1.2)
Total Protein: 7.6 g/dL (ref 6.5–8.1)

## 2023-10-01 LAB — LACTATE DEHYDROGENASE: LDH: 112 U/L (ref 98–192)

## 2023-10-01 NOTE — Progress Notes (Signed)
Patient currently being treated for UTI 

## 2023-10-02 NOTE — Progress Notes (Signed)
 Hematology/Oncology Consult note [facility]  Telephone:([phone] Fax:([phone]  Patient Care Team: Lenon Layman ORN, MD as PCP - General (Internal Medicine) Donette Ellouise LABOR, FNP as Nurse Practitioner (Family Medicine) Ammon Blunt, MD as Consulting Physician (Cardiology) Melanee Annah BROCKS, MD as Consulting Physician (Oncology)   Name of the patient: Lori Browning  [id number]  [birth year]   Date of visit: 10/02/23  Diagnosis-splenic marginal zone lymphoma  Chief complaint/ Reason for visit-routine follow-up of splenic marginal zone lymphoma  Heme/Onc history:  patient is a 89 year old female with a past medical history significant for hypertension and hyperlipidemia.She has been referred to us  for lymphocytosis.  Most recently her CBC showed white count of 26.4, H&H of 14.8/46.1 and platelet count of 159.  Differential mainly showed lymphocytosis with some basophilia and monocytosis.  Patient has had chronic leukocytosis/lymphocytosis at least dating back to 2017.  Hemoglobin and platelets have always been normal.  Differential in the past also has shown mainly lymphocytosis and some monocytosis.  Patient presently complains of pain in her right paraspinal area.  Reports that the pain worsens when she moves around or bends.  CT abdomen and pelvis with contrast in march 2022 showed liver cysts and mild splenomegaly of 14 cm.  No intra-abdominal adenopathy no other acute intra-abdominal pathology. spleen size in sept 2023 was 16.3 cm with volume of 1003 cc   Results of blood work from 05/06/2020 were as follows: CBC showed white cell count of 29.3, H&H of 14.5/45.4 with a platelet count of 162.  Differential mainly showed lymphocytosis with an absolute lymphocyte count of 20 with some monocytosis and basophilia.  BCR ABL FISH testing was negative.  Smear review showed monomorphic lymphoid cells which were large with abundant cytoplasm.  Morphology  indicative of a lymphoid neoplasm.  Differentiations include CLL with variant morphology, leukemic phase of lymphoma such as mantle cell lymphoma or prolymphocytic leukemia.  Flow cytometry showed CD5 negative CD10 negative and CD 103 - B-cell lymphoproliferative disorder. Typical phenotype for CLL, mantle cell, hairy cell or follicular center cell lymphoma not detected.     Given the persistent increase in white cell count and lymphocytosis patient had bone marrow biopsy on 11/30/2022.  Biopsy showed bone marrow and peripheral blood involved with B-cell lymphoproliferative disorder lacking CD5, CD10 and CD25 CD34 7038 CD103 or cyclin D1 expression.  Morphologic and immunophenotypic features include marginal zone lymphoma, splenic lymphoma.    Interval history-patient is doing well for her age.  Appetite and weight have remained stable.  She denies any early satiety or abdominal fullness.  ECOG PS- 1 Pain scale- 0   Review of systems- Review of Systems  Constitutional:  Negative for chills, fever, malaise/fatigue and weight loss.  HENT:  Negative for congestion, ear discharge and nosebleeds.   Eyes:  Negative for blurred vision.  Respiratory:  Negative for cough, hemoptysis, sputum production, shortness of breath and wheezing.   Cardiovascular:  Negative for chest pain, palpitations, orthopnea and claudication.  Gastrointestinal:  Negative for abdominal pain, blood in stool, constipation, diarrhea, heartburn, melena, nausea and vomiting.  Genitourinary:  Negative for dysuria, flank pain, frequency, hematuria and urgency.  Musculoskeletal:  Negative for back pain, joint pain and myalgias.  Skin:  Negative for rash.  Neurological:  Negative for dizziness, tingling, focal weakness, seizures, weakness and headaches.  Endo/Heme/Allergies:  Does not bruise/bleed easily.  Psychiatric/Behavioral:  Negative for depression and suicidal ideas. The patient does not have insomnia.  Allergies   Allergen Reactions   Sulfa Antibiotics Nausea And Vomiting and Rash     Past Medical History:  Diagnosis Date   Atypical mole 07/07/2017   right upper back/severe. excision 07/07/2017. 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)    Urge incontinence      Past Surgical History:  Procedure Laterality Date   BREAST CYST EXCISION     CATARACT EXTRACTION     hip replacment     IR BONE MARROW BIOPSY & ASPIRATION  11/30/2022   REPLACEMENT TOTAL KNEE      Social History   Socioeconomic History   Marital status: Widowed    Spouse name: Not on file   Number of children: Not on file   Years of education: Not on file   Highest education level: Not on file  Occupational History   Not on file  Tobacco Use   Smoking status: Never   Smokeless tobacco: Never  Vaping Use   Vaping status: Never Used  Substance and Sexual Activity   Alcohol use: Never   Drug use: Never   Sexual activity: Not Currently  Other Topics Concern   Not on file  Social History Narrative   Not on file   Social Drivers of Health   Financial Resource Strain: Low Risk  (08/03/2023)   Received from [facility]   Overall Financial Resource Strain (CARDIA)    Difficulty of Paying Living Expenses: Not hard at all  Food Insecurity: No Food Insecurity (08/03/2023)   [facility] from [facility]   Hunger Vital Sign    Within the past 12 months, you worried that your food would run out before you got the money to buy more.: Never true    Within the past 12 months, the food you bought just didn't last and you didn't have money to get more.: Never true  Transportation Needs: No Transportation Needs (08/03/2023)   Received from [facility] - Transportation    In the past 12 months, has lack of transportation kept  you from medical appointments or from getting medications?: No    Lack of Transportation (Non-Medical): No  Physical Activity: Not on file  Stress: Not on file  Social Connections: Not on file  Intimate Partner Violence: Not on file    Family History  Problem Relation Age of Onset   Hypertension Mother    Colon cancer Mother    Breast cancer Mother    Aortic aneurysm Father    Pancreatic cancer Brother    Colon cancer Brother    Heart disease Brother    Heart Problems Brother      Current Outpatient Medications:    amLODipine (NORVASC) 5 MG tablet, Take 5 mg by mouth daily., Disp: , Rfl:    carvedilol  (COREG ) 25 MG tablet, Take 25 mg by mouth 2 (two) times daily with a meal., Disp: , Rfl:    cefUROXime (CEFTIN) 500 MG tablet, Take 500 mg by mouth 2 (two) times daily. for 7 days, Disp: , Rfl:    Cranberry 500 MG CAPS, Take 1 tablet by mouth daily. , Disp: , Rfl:    losartan-hydrochlorothiazide (HYZAAR) 100-12.5 MG tablet, Take 1 tablet by mouth daily., Disp: , Rfl:    pravastatin  (PRAVACHOL ) 40 MG tablet, Take 40 mg  by mouth daily., Disp: , Rfl:    spironolactone  (ALDACTONE ) 25 MG tablet, Take 25 mg by mouth daily., Disp: , Rfl:   Physical exam:  Vitals:   10/01/23 1353  BP: (!) 146/74  Pulse: 68  Resp: 16  Temp: (!) 96.6 F (35.9 C)  TempSrc: Tympanic  SpO2: 95%  Weight: 224 lb (101.6 kg)   Physical Exam Cardiovascular:     Rate and Rhythm: Normal rate and regular rhythm.     Heart sounds: Normal heart sounds.  Pulmonary:     Effort: Pulmonary effort is normal.     Breath sounds: Normal breath sounds.  Abdominal:     General: Bowel sounds are normal.     Palpations: Abdomen is soft.     Comments: Splenic tip is palpable  Skin:    General: Skin is warm and dry.  Neurological:     Mental Status: She is alert and oriented to person, place, and time.      I have personally reviewed labs listed below:    Latest Ref Rng & Units 10/01/2023    1:43 PM  CMP   Glucose 70 - 99 mg/dL 98   BUN 8 - 23 mg/dL 32   Creatinine 9.55 - 1.00 mg/dL 8.98   Sodium 864 - 854 mmol/L 134   Potassium 3.5 - 5.1 mmol/L 3.9   Chloride 98 - 111 mmol/L 102   CO2 22 - 32 mmol/L 24   Calcium 8.9 - 10.3 mg/dL 9.4   Total Protein 6.5 - 8.1 g/dL 7.6   Total Bilirubin 0.0 - 1.2 mg/dL 0.9   Alkaline Phos 38 - 126 U/L 49   AST 15 - 41 U/L 19   ALT 0 - 44 U/L 13       Latest Ref Rng & Units 10/01/2023    1:43 PM  CBC  WBC 4.0 - 10.5 K/uL 43.5   Hemoglobin 12.0 - 15.0 g/dL 87.3   Hematocrit 63.9 - 46.0 % 38.9   Platelets 150 - 400 K/uL 152    I have personally reviewed Radiology images listed below: No images are attached to the encounter.  US  SPLEEN (ABDOMEN LIMITED) Result Date: 09/30/2023 CLINICAL DATA:  Splenic marginal zone lymphoma. EXAM: ULTRASOUND ABDOMEN LIMITED COMPARISON:  02/23/2023 FINDINGS: Splenomegaly is again seen. Spleen measures 17.2 by 6.0 by 16.5 cm, with calculated volume of 890 mL. No splenic masses identified. IMPRESSION: Moderate splenomegaly, without significant change compared to previous study. Electronically Signed   By: Norleen DELENA Kil M.D.   On: 09/30/2023 14:06     Assessment and plan- Patient is a 89 y.o. 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  PET scan did not show any evidence of hypermetabolic adenopathy.  She has mild to moderate splenomegaly with a splenic size that has remained stable over the last 1 year.  She is not symptomatic from her splenomegaly.  Bone marrow biopsy was also consistent with B-cell lymphoproliferative disorder and clinically this would fit the picture of a splenic marginal zone lymphoma.  I will continue to monitor however without offering any active treatment at this time.  If there is  worsening lymphocytosis with associated cytopenias or worsening splenomegaly and B symptoms I will offer her weekly Rituxan at that time.  I will see her back in 6 months with CBC with differential CMP LDH  and ultrasound spleen prior   Visit Diagnosis 1. Splenic marginal zone b-cell lymphoma (HCC)      Dr. Annah Skene, MD, MPH [facility] at [facility] [phone] 10/02/2023 12:00 PM

## 2024-03-13 ENCOUNTER — Ambulatory Visit

## 2024-03-20 ENCOUNTER — Ambulatory Visit

## 2024-04-07 ENCOUNTER — Ambulatory Visit: Admitting: Oncology

## 2024-04-07 ENCOUNTER — Other Ambulatory Visit

## 2024-08-08 ENCOUNTER — Ambulatory Visit: Admitting: Dermatology
